# Patient Record
Sex: Female | Born: 1940 | Race: Black or African American | Hispanic: No | Smoking: Never smoker
Health system: Southern US, Community
[De-identification: ages and names within clinical notes are randomized; demographics above are authoritative.]

## PROBLEM LIST (undated history)

## (undated) DIAGNOSIS — C786 Secondary malignant neoplasm of retroperitoneum and peritoneum: Secondary | ICD-10-CM

## (undated) DIAGNOSIS — C801 Malignant (primary) neoplasm, unspecified: Secondary | ICD-10-CM

## (undated) DIAGNOSIS — E7800 Pure hypercholesterolemia, unspecified: Secondary | ICD-10-CM

## (undated) DIAGNOSIS — E119 Type 2 diabetes mellitus without complications: Secondary | ICD-10-CM

## (undated) DIAGNOSIS — I10 Essential (primary) hypertension: Secondary | ICD-10-CM

## (undated) DIAGNOSIS — D509 Iron deficiency anemia, unspecified: Secondary | ICD-10-CM

## (undated) DIAGNOSIS — K219 Gastro-esophageal reflux disease without esophagitis: Secondary | ICD-10-CM

## (undated) DIAGNOSIS — E039 Hypothyroidism, unspecified: Secondary | ICD-10-CM

## (undated) HISTORY — PX: ABDOMINAL HYSTERECTOMY: SHX81

## (undated) HISTORY — DX: Iron deficiency anemia, unspecified: D50.9

## (undated) HISTORY — DX: Malignant (primary) neoplasm, unspecified: C80.1

## (undated) HISTORY — DX: Secondary malignant neoplasm of retroperitoneum and peritoneum: C78.6

---

## 2011-08-17 ENCOUNTER — Encounter (HOSPITAL_COMMUNITY): Payer: Self-pay | Admitting: Pharmacy Technician

## 2011-08-18 ENCOUNTER — Encounter (HOSPITAL_COMMUNITY)
Admission: RE | Admit: 2011-08-18 | Discharge: 2011-08-18 | Disposition: A | Discharge status: Home / Self Care | Payer: Medicare Other | Source: Ambulatory Visit | Attending: Ophthalmology | Admitting: Ophthalmology

## 2011-08-18 ENCOUNTER — Encounter (HOSPITAL_COMMUNITY): Payer: Self-pay

## 2011-08-18 HISTORY — DX: Hypothyroidism, unspecified: E03.9

## 2011-08-18 HISTORY — DX: Type 2 diabetes mellitus without complications: E11.9

## 2011-08-18 HISTORY — DX: Pure hypercholesterolemia, unspecified: E78.00

## 2011-08-18 HISTORY — DX: Essential (primary) hypertension: I10

## 2011-08-18 LAB — BASIC METABOLIC PANEL
CO2: 28 mEq/L (ref 19–32)
Calcium: 9.6 mg/dL (ref 8.4–10.5)
Creatinine, Ser: 0.68 mg/dL (ref 0.50–1.10)
Glucose, Bld: 101 mg/dL — ABNORMAL HIGH (ref 70–99)

## 2011-08-18 LAB — HEMOGLOBIN AND HEMATOCRIT, BLOOD: HCT: 32.5 % — ABNORMAL LOW (ref 36.0–46.0)

## 2011-08-18 NOTE — Patient Instructions (Signed)
Your procedure is scheduled on:  Monday, 08/23/11  Report to [facility] at  0800   AM.  Call this number if you have problems the morning of surgery: [phone]   Remember:   Do not eat or drink   After Midnight.  Take these medicines the morning of surgery with A SIP OF WATER: levothyroxine and enalapril   Do not wear jewelry, make-up or nail polish.  Do not wear lotions, powders, or perfumes. You may wear deodorant.  Do not bring valuables to the hospital.  Contacts, dentures or bridgework may not be worn into surgery.     Patients discharged the day of surgery will not be allowed to drive home.  Special Instructions: Use eye drops as directed.   Please read over the following fact sheets that you were given: Pain Booklet, Anesthesia Post-op Instructions and Care and Recovery After Surgery    Cataract Surgery  A cataract is a clouding of the lens of the eye. When a lens becomes cloudy, vision is reduced based on the degree and nature of the clouding. Surgery may be needed to improve vision. Surgery removes the cloudy lens and usually replaces it with a substitute lens (intraocular lens, IOL). LET YOUR EYE DOCTOR KNOW ABOUT:  Allergies to food or medicine.   Medicines taken including herbs, eyedrops, over-the-counter medicines, and creams.   Use of steroids (by mouth or creams).   Previous problems with anesthetics or numbing medicine.   History of bleeding problems or blood clots.   Previous surgery.   Other health problems, including diabetes and kidney problems.   Possibility of pregnancy, if this applies.  RISKS AND COMPLICATIONS  Infection.   Inflammation of the eyeball (endophthalmitis) that can spread to both eyes (sympathetic ophthalmia).   Poor wound healing.   If an IOL is inserted, it can later fall out of proper position. This is very uncommon.   Clouding of the part of your eye that holds an IOL in place. This is called an "after-cataract." These are  uncommon, but easily treated.  BEFORE THE PROCEDURE  Do not eat or drink anything except small amounts of water for 8 to 12 before your surgery, or as directed by your caregiver.   Unless you are told otherwise, continue any eyedrops you have been prescribed.   Talk to your primary caregiver about all other medicines that you take (both prescription and non-prescription). In some cases, you may need to stop or change medicines near the time of your surgery. This is most important if you are taking blood-thinning medicine.Do not stop medicines unless you are told to do so.   Arrange for someone to drive you to and from the procedure.   Do not put contact lenses in either eye on the day of your surgery.  PROCEDURE There is more than one method for safely removing a cataract. Your doctor can explain the differences and help determine which is best for you. Phacoemulsification surgery is the most common form of cataract surgery.  An injection is given behind the eye or eyedrops are given to make this a painless procedure.   A small cut (incision) is made on the edge of the clear, dome-shaped surface that covers the front of the eye (cornea).   A tiny probe is painlessly inserted into the eye. This device gives off ultrasound waves that soften and break up the cloudy center of the lens. This makes it easier for the cloudy lens to be removed by suction.  An IOL may be implanted.   The normal lens of the eye is covered by a clear capsule. Part of that capsule is intentionally left in the eye to support the IOL.   Your surgeon may or may not use stitches to close the incision.  There are other forms of cataract surgery that require a larger incision and stiches to close the eye. This approach is taken in cases where the doctor feels that the cataract cannot be easily removed using phacoemulsification. AFTER THE PROCEDURE  When an IOL is implanted, it does not need care. It becomes a  permanent part of your eye and cannot be seen or felt.   Your doctor will schedule follow-up exams to check on your progress.   Review your other medicines with your doctor to see which can be resumed after surgery.   Use eyedrops or take medicine as prescribed by your doctor.  Document Released: 02/18/2011 Document Reviewed: 02/15/2011 [facility] Patient Information 2012 [place], [place].  PATIENT INSTRUCTIONS POST-ANESTHESIA  IMMEDIATELY FOLLOWING SURGERY:  Do not drive or operate machinery for the first twenty four hours after surgery.  Do not make any important decisions for twenty four hours after surgery or while taking narcotic pain medications or sedatives.  If you develop intractable nausea and vomiting or a severe headache please notify your doctor immediately.  FOLLOW-UP:  Please make an appointment with your surgeon as instructed. You do not need to follow up with anesthesia unless specifically instructed to do so.  WOUND CARE INSTRUCTIONS (if applicable):  Keep a dry clean dressing on the anesthesia/puncture wound site if there is drainage.  Once the wound has quit draining you may leave it open to air.  Generally you should leave the bandage intact for twenty four hours unless there is drainage.  If the epidural site drains for more than 36-48 hours please call the anesthesia department.  QUESTIONS?:  Please feel free to call your physician or the hospital operator if you have any questions, and they will be happy to assist you.

## 2011-08-20 MED ORDER — CYCLOPENTOLATE-PHENYLEPHRINE 0.2-1 % OP SOLN
OPHTHALMIC | Status: AC
  Filled 2011-08-20: qty 2

## 2011-08-20 MED ORDER — TETRACAINE HCL 0.5 % OP SOLN
OPHTHALMIC | Status: AC
  Filled 2011-08-20: qty 2

## 2011-08-20 MED ORDER — NEOMYCIN-POLYMYXIN-DEXAMETH 3.5-10000-0.1 OP OINT
TOPICAL_OINTMENT | OPHTHALMIC | Status: AC
  Filled 2011-08-20: qty 3.5

## 2011-08-20 MED ORDER — PHENYLEPHRINE HCL 2.5 % OP SOLN
OPHTHALMIC | Status: AC
  Filled 2011-08-20: qty 2

## 2011-08-20 MED ORDER — LIDOCAINE HCL 3.5 % OP GEL
OPHTHALMIC | Status: AC
  Filled 2011-08-20: qty 5

## 2011-08-23 ENCOUNTER — Ambulatory Visit (HOSPITAL_COMMUNITY): Payer: Medicare Other | Admitting: Anesthesiology

## 2011-08-23 ENCOUNTER — Encounter (HOSPITAL_COMMUNITY): Payer: Self-pay | Admitting: Anesthesiology

## 2011-08-23 ENCOUNTER — Encounter (HOSPITAL_COMMUNITY): Payer: Self-pay | Admitting: *Deleted

## 2011-08-23 ENCOUNTER — Encounter (HOSPITAL_COMMUNITY)
Admission: RE | Disposition: A | Discharge status: Home Health | Payer: Self-pay | Source: Ambulatory Visit | Attending: Ophthalmology

## 2011-08-23 ENCOUNTER — Ambulatory Visit (HOSPITAL_COMMUNITY)
Admission: RE | Admit: 2011-08-23 | Discharge: 2011-08-23 | Disposition: A | Discharge status: Home Health | Payer: Medicare Other | Source: Ambulatory Visit | Attending: Ophthalmology | Admitting: Ophthalmology

## 2011-08-23 DIAGNOSIS — Z79899 Other long term (current) drug therapy: Secondary | ICD-10-CM | POA: Insufficient documentation

## 2011-08-23 DIAGNOSIS — Z01810 Encounter for preprocedural cardiovascular examination: Secondary | ICD-10-CM | POA: Insufficient documentation

## 2011-08-23 DIAGNOSIS — Z01812 Encounter for preprocedural laboratory examination: Secondary | ICD-10-CM | POA: Insufficient documentation

## 2011-08-23 DIAGNOSIS — E119 Type 2 diabetes mellitus without complications: Secondary | ICD-10-CM | POA: Insufficient documentation

## 2011-08-23 DIAGNOSIS — I10 Essential (primary) hypertension: Secondary | ICD-10-CM | POA: Insufficient documentation

## 2011-08-23 DIAGNOSIS — H2589 Other age-related cataract: Secondary | ICD-10-CM | POA: Insufficient documentation

## 2011-08-23 HISTORY — PX: CATARACT EXTRACTION W/PHACO: SHX586

## 2011-08-23 SURGERY — PHACOEMULSIFICATION, CATARACT, WITH IOL INSERTION
Anesthesia: Monitor Anesthesia Care | Site: Eye | Laterality: Left | Wound class: Clean

## 2011-08-23 MED ORDER — POVIDONE-IODINE 5 % OP SOLN
OPHTHALMIC | Status: DC | PRN
  Administered 2011-08-23: 1 via OPHTHALMIC

## 2011-08-23 MED ORDER — LACTATED RINGERS IV SOLN
INTRAVENOUS | Status: DC
  Administered 2011-08-23: 09:00:00 via INTRAVENOUS

## 2011-08-23 MED ORDER — MIDAZOLAM HCL 2 MG/2ML IJ SOLN
1.0000 mg | INTRAMUSCULAR | Status: DC | PRN
  Administered 2011-08-23: 2 mg via INTRAVENOUS

## 2011-08-23 MED ORDER — EPINEPHRINE HCL 1 MG/ML IJ SOLN
INTRAMUSCULAR | Status: AC
  Filled 2011-08-23: qty 1

## 2011-08-23 MED ORDER — LIDOCAINE HCL 3.5 % OP GEL: 1.0000 "application " | Freq: Once | OPHTHALMIC | Status: DC

## 2011-08-23 MED ORDER — LIDOCAINE 3.5 % OP GEL OPTIME - NO CHARGE
OPHTHALMIC | Status: DC | PRN
  Administered 2011-08-23: 2 [drp] via OPHTHALMIC

## 2011-08-23 MED ORDER — CYCLOPENTOLATE-PHENYLEPHRINE 0.2-1 % OP SOLN
1.0000 [drp] | OPHTHALMIC | Status: AC
  Administered 2011-08-23 (×3): 1 [drp] via OPHTHALMIC

## 2011-08-23 MED ORDER — TETRACAINE HCL 0.5 % OP SOLN
1.0000 [drp] | OPHTHALMIC | Status: AC
  Administered 2011-08-23 (×3): 1 [drp] via OPHTHALMIC

## 2011-08-23 MED ORDER — PHENYLEPHRINE HCL 2.5 % OP SOLN
1.0000 [drp] | OPHTHALMIC | Status: AC
  Administered 2011-08-23 (×3): 1 [drp] via OPHTHALMIC

## 2011-08-23 MED ORDER — BSS IO SOLN
INTRAOCULAR | Status: DC | PRN
  Administered 2011-08-23: 15 mL via INTRAOCULAR

## 2011-08-23 MED ORDER — MIDAZOLAM HCL 2 MG/2ML IJ SOLN
INTRAMUSCULAR | Status: AC
  Filled 2011-08-23: qty 2

## 2011-08-23 MED ORDER — NEOMYCIN-POLYMYXIN-DEXAMETH 0.1 % OP OINT
TOPICAL_OINTMENT | OPHTHALMIC | Status: DC | PRN
  Administered 2011-08-23: 1 via OPHTHALMIC

## 2011-08-23 MED ORDER — LIDOCAINE HCL (PF) 1 % IJ SOLN
INTRAMUSCULAR | Status: DC | PRN
  Administered 2011-08-23: .3 mL

## 2011-08-23 MED ORDER — PROVISC 10 MG/ML IO SOLN
INTRAOCULAR | Status: DC | PRN
  Administered 2011-08-23: 8.5 mg via INTRAOCULAR

## 2011-08-23 MED ORDER — EPINEPHRINE HCL 1 MG/ML IJ SOLN
INTRAOCULAR | Status: DC | PRN
  Administered 2011-08-23: 10:00:00

## 2011-08-23 SURGICAL SUPPLY — 32 items
CAPSULAR TENSION RING-AMO (OPHTHALMIC RELATED) IMPLANT
CLOTH BEACON ORANGE TIMEOUT ST (SAFETY) ×2 IMPLANT
EYE SHIELD UNIVERSAL CLEAR (GAUZE/BANDAGES/DRESSINGS) ×2 IMPLANT
GLOVE BIO SURGEON STRL SZ 6.5 (GLOVE) IMPLANT
GLOVE BIOGEL PI IND STRL 6.5 (GLOVE) ×1 IMPLANT
GLOVE BIOGEL PI IND STRL 7.0 (GLOVE) ×2 IMPLANT
GLOVE BIOGEL PI IND STRL 7.5 (GLOVE) IMPLANT
GLOVE BIOGEL PI INDICATOR 6.5 (GLOVE) ×1
GLOVE BIOGEL PI INDICATOR 7.0 (GLOVE) ×2
GLOVE BIOGEL PI INDICATOR 7.5 (GLOVE)
GLOVE ECLIPSE 6.5 STRL STRAW (GLOVE) IMPLANT
GLOVE ECLIPSE 7.0 STRL STRAW (GLOVE) IMPLANT
GLOVE ECLIPSE 7.5 STRL STRAW (GLOVE) IMPLANT
GLOVE EXAM NITRILE LRG STRL (GLOVE) IMPLANT
GLOVE EXAM NITRILE MD LF STRL (GLOVE) ×2 IMPLANT
GLOVE SKINSENSE NS SZ6.5 (GLOVE)
GLOVE SKINSENSE NS SZ7.0 (GLOVE)
GLOVE SKINSENSE STRL SZ6.5 (GLOVE) IMPLANT
GLOVE SKINSENSE STRL SZ7.0 (GLOVE) IMPLANT
KIT VITRECTOMY (OPHTHALMIC RELATED) IMPLANT
PAD ARMBOARD 7.5X6 YLW CONV (MISCELLANEOUS) ×2 IMPLANT
PROC W NO LENS (INTRAOCULAR LENS)
PROC W SPEC LENS (INTRAOCULAR LENS)
PROCESS W NO LENS (INTRAOCULAR LENS) IMPLANT
PROCESS W SPEC LENS (INTRAOCULAR LENS) IMPLANT
RING MALYGIN (MISCELLANEOUS) IMPLANT
SIGHTPATH CAT PROC W REG LENS (Ophthalmic Related) ×2 IMPLANT
SYR TB 1ML LL NO SAFETY (SYRINGE) ×2 IMPLANT
TAPE SURG TRANSPORE 1 IN (GAUZE/BANDAGES/DRESSINGS) ×1 IMPLANT
TAPE SURGICAL TRANSPORE 1 IN (GAUZE/BANDAGES/DRESSINGS) ×1
VISCOELASTIC ADDITIONAL (OPHTHALMIC RELATED) IMPLANT
WATER STERILE IRR 250ML POUR (IV SOLUTION) ×2 IMPLANT

## 2011-08-23 NOTE — H&P (Signed)
I have reviewed the H&P, the patient was re-examined, and I have identified no interval changes in medical condition and plan of care since the history and physical of record  

## 2011-08-23 NOTE — Op Note (Signed)
[name], Sherry Hopkins          ACCOUNT NO.:  [id number]  MEDICAL RECORD NO.:  [id number]  LOCATION:  [facility]                          FACILITY:  [facility]  PHYSICIAN:  Susanne Greenhouse, MD       DATE OF BIRTH:  12-Jul-1940  DATE OF PROCEDURE:  08/23/2011 DATE OF DISCHARGE:  08/23/2011                              OPERATIVE REPORT   PREOPERATIVE DIAGNOSIS:  Combined cataract, left eye, diagnosis code 366.19.  POSTOPERATIVE DIAGNOSIS:  Combined cataract, left eye, diagnosis code 366.19.  OPERATION PERFORMED:  Phacoemulsification with posterior chamber intraocular lens implantation, left eye.  SURGEON:  Bonne Dolores. Rashard Ryle, MD  ANESTHESIA:  Topical with IV sedation and monitored anesthesia care.  OPERATIVE SUMMARY:  In the preoperative area, dilating drops were placed into the left eye.  The patient was then brought into the operating room where he was placed under general anesthesia.  The eye was then prepped and draped.  Beginning with a 75 blade, a paracentesis port was made at the surgeon's 2 o'clock position.  The anterior chamber was then filled with a 1% nonpreserved lidocaine solution with epinephrine.  This was followed by Viscoat to deepen the chamber.  A small fornix-based peritomy was performed superiorly.  Next, a single iris hook was placed through the limbus superiorly.  A 2.4-mm keratome blade was then used to make a clear corneal incision over the iris hook.  A bent cystotome needle and Utrata forceps were used to create a continuous tear capsulotomy.  Hydrodissection was performed using balanced salt solution on a fine cannula.  The lens nucleus was then removed using phacoemulsification in a quadrant cracking technique.  The cortical material was then removed with irrigation and aspiration.  The capsular bag and anterior chamber were refilled with Provisc.  The wound was widened to approximately 3 mm and a posterior chamber intraocular lens was placed into the capsular bag  without difficulty using an [employer] lens injecting system.  A single 10-0 nylon suture was then used to close the incision as well as stromal hydration.  The Provisc was removed from the anterior chamber and capsular bag with irrigation and aspiration.  At this point, the wounds were tested for leak, which were negative.  The anterior chamber remained deep and stable.  The patient tolerated the procedure well.  There were no operative complications, and he awoke from general anesthesia without problem.  There were no surgical specimens.  Prosthetic device used is a Lenstec posterior chamber lens, model Softec HD, power of 19.75, serial number is [id number].          ______________________________ Susanne Greenhouse, MD     KEH/MEDQ  D:  08/23/2011  T:  08/23/2011  Job:  [id number]

## 2011-08-23 NOTE — Anesthesia Postprocedure Evaluation (Signed)
  Anesthesia Post-op Note  Patient: Sherry Hopkins  Procedure(s) Performed: Procedure(s) (LRB): CATARACT EXTRACTION PHACO AND INTRAOCULAR LENS PLACEMENT (IOC) (Left)  Patient Location:  Short Stay  Anesthesia Type: MAC  Level of Consciousness: awake  Airway and Oxygen Therapy: Patient Spontanous Breathing  Post-op Pain: none  Post-op Assessment: Post-op Vital signs reviewed, Patient's Cardiovascular Status Stable, Respiratory Function Stable, Patent Airway, No signs of Nausea or vomiting and Pain level controlled  Post-op Vital Signs: Reviewed and stable  Complications: No apparent anesthesia complications

## 2011-08-23 NOTE — Transfer of Care (Signed)
Immediate Anesthesia Transfer of Care Note  Patient: Sherry Hopkins  Procedure(s) Performed: Procedure(s) (LRB): CATARACT EXTRACTION PHACO AND INTRAOCULAR LENS PLACEMENT (IOC) (Left)  Patient Location: Shortstay  Anesthesia Type: MAC  Level of Consciousness: awake  Airway & Oxygen Therapy: Patient Spontanous Breathing   Post-op Assessment: Report given to PACU RN, Post -op Vital signs reviewed and stable and Patient moving all extremities  Post vital signs: Reviewed and stable  Complications: No apparent anesthesia complications

## 2011-08-23 NOTE — Anesthesia Procedure Notes (Signed)
Procedure Name: MAC Date/Time: 08/23/2011 9:35 AM Performed by: Franco Nones Pre-anesthesia Checklist: Patient identified, Emergency Drugs available, Suction available, Timeout performed and Patient being monitored Patient Re-evaluated:Patient Re-evaluated prior to inductionOxygen Delivery Method: Nasal Cannula

## 2011-08-23 NOTE — Anesthesia Preprocedure Evaluation (Signed)
Anesthesia Evaluation    Airway Mallampati: II      Dental  (+) Partial Upper   Pulmonary neg pulmonary ROS,  breath sounds clear to auscultation        Cardiovascular hypertension, Pt. on medications Rhythm:Regular Rate:Normal     Neuro/Psych    GI/Hepatic   Endo/Other  Diabetes mellitus-, Well Controlled, Type 2Hypothyroidism   Renal/GU      Musculoskeletal   Abdominal   Peds  Hematology   Anesthesia Other Findings   Reproductive/Obstetrics                           Anesthesia Physical Anesthesia Plan  ASA: II  Anesthesia Plan: MAC   Post-op Pain Management:    Induction: Intravenous  Airway Management Planned: Nasal Cannula  Additional Equipment:   Intra-op Plan:   Post-operative Plan:   Informed Consent: I have reviewed the patients History and Physical, chart, labs and discussed the procedure including the risks, benefits and alternatives for the proposed anesthesia with the patient or authorized representative who has indicated his/her understanding and acceptance.     Plan Discussed with:   Anesthesia Plan Comments:         Anesthesia Quick Evaluation

## 2011-08-23 NOTE — Brief Op Note (Signed)
Pre-Op Dx: Cataract OS Post-Op Dx: Cataract OS Surgeon: Xaine Sansom Anesthesia: Topical with MAC Surgery: Cataract Extraction with Intraocular lens Implant OS Implant: Lenstec, Model Softec HD Specimen: None Complications: None 

## 2011-08-23 NOTE — Discharge Instructions (Signed)
Sherry Hopkins  08/23/2011     Instructions  1. Use medications as Instructed.  Shake well before use. Wait 5 minutes between drops.  {2. Do not rub the operative eye. Do not swim underwater for 2 weeks.  3. You may remove the clear shield and resume your normal activities the day after  Surgery. Your eyes may feel more comfortable if you wear dark glasses outside.  4. Call our office at [phone] if you have sudden change in vision, extreme redness or pain. Some fluctuation in vision is normal after surgery. If you have an emergency after hours, call Dr. Alto Denver at [phone].  5. It is important that you attend all of your follow-up appointments.        Follow-up:{follow up:32580} with Gemma Payor, MD.   Dr. Lahoma Crocker: [phone]  Dr. Lita Mains: [phone]  Dr. Alto Denver: [phone]   If you find that you cannot contact your physician, but feel that your signs and   Symptoms warrant a physician's attention, call the Emergency Room at   [phone].   Other{NA AND VFIEPPIR:51884}

## 2011-08-24 ENCOUNTER — Encounter (HOSPITAL_COMMUNITY): Payer: Self-pay | Admitting: Ophthalmology

## 2013-08-09 ENCOUNTER — Encounter (HOSPITAL_COMMUNITY): Payer: Self-pay | Admitting: Pharmacy Technician

## 2013-08-10 NOTE — Patient Instructions (Signed)
Kaydance Bowie  08/10/2013   Your procedure is scheduled on:  08/16/13  Report to [facility] at 9:30 AM.  Call this number if you have problems the morning of surgery: [phone]   Remember:   Do not eat food or drink liquids after midnight.   Take these medicines the morning of surgery with A SIP OF WATER: Vasotec and Synthroid   Do not wear jewelry, make-up or nail polish.  Do not wear lotions, powders, or perfumes. You may wear deodorant.  Do not shave 48 hours prior to surgery. Men may shave face and neck.  Do not bring valuables to the hospital.  [facility] is not responsible for any belongings or valuables.               Contacts, dentures or bridgework may not be worn into surgery.  Leave suitcase in the car. After surgery it may be brought to your room.  For patients admitted to the hospital, discharge time is determined by your treatment team.               Patients discharged the day of surgery will not be allowed to drive  home.  Name and phone number of your driver:   Special Instructions: N/A   Please read over the following fact sheets that you were given: Anesthesia Post-op Instructions   PATIENT INSTRUCTIONS POST-ANESTHESIA  IMMEDIATELY FOLLOWING SURGERY:  Do not drive or operate machinery for the first twenty four hours after surgery.  Do not make any important decisions for twenty four hours after surgery or while taking narcotic pain medications or sedatives.  If you develop intractable nausea and vomiting or a severe headache please notify your doctor immediately.  FOLLOW-UP:  Please make an appointment with your surgeon as instructed. You do not need to follow up with anesthesia unless specifically instructed to do so.  WOUND CARE INSTRUCTIONS (if applicable):  Keep a dry clean dressing on the anesthesia/puncture wound site if there is drainage.  Once the wound has quit draining you may leave it open to air.  Generally you should leave the bandage intact  for twenty four hours unless there is drainage.  If the epidural site drains for more than 36-48 hours please call the anesthesia department.  QUESTIONS?:  Please feel free to call your physician or the hospital operator if you have any questions, and they will be happy to assist you.      Cataract Surgery  A cataract is a clouding of the lens of the eye. When a lens becomes cloudy, vision is reduced based on the degree and nature of the clouding. Surgery may be needed to improve vision. Surgery removes the cloudy lens and usually replaces it with a substitute lens (intraocular lens, IOL). LET YOUR EYE DOCTOR KNOW ABOUT:  Allergies to food or medicine.  Medicines taken including herbs, eyedrops, over-the-counter medicines, and creams.  Use of steroids (by mouth or creams).  Previous problems with anesthetics or numbing medicine.  History of bleeding problems or blood clots.  Previous surgery.  Other health problems, including diabetes and kidney problems.  Possibility of pregnancy, if this applies. RISKS AND COMPLICATIONS  Infection.  Inflammation of the eyeball (endophthalmitis) that can spread to both eyes (sympathetic ophthalmia).  Poor wound healing.  If an IOL is inserted, it can later fall out of proper position. This is very uncommon.  Clouding of the part of your eye that holds an IOL in place. This is called an "after-cataract."  These are uncommon, but easily treated. BEFORE THE PROCEDURE  Do not eat or drink anything except small amounts of water for 8 to 12 before your surgery, or as directed by your caregiver.  Unless you are told otherwise, continue any eyedrops you have been prescribed.  Talk to your primary caregiver about all other medicines that you take (both prescription and non-prescription). In some cases, you may need to stop or change medicines near the time of your surgery. This is most important if you are taking blood-thinning medicine.Do not stop  medicines unless you are told to do so.  Arrange for someone to drive you to and from the procedure.  Do not put contact lenses in either eye on the day of your surgery. PROCEDURE There is more than one method for safely removing a cataract. Your doctor can explain the differences and help determine which is best for you. Phacoemulsification surgery is the most common form of cataract surgery.  An injection is given behind the eye or eyedrops are given to make this a painless procedure.  A small cut (incision) is made on the edge of the clear, dome-shaped surface that covers the front of the eye (cornea).  A tiny probe is painlessly inserted into the eye. This device gives off ultrasound waves that soften and break up the cloudy center of the lens. This makes it easier for the cloudy lens to be removed by suction.  An IOL may be implanted.  The normal lens of the eye is covered by a clear capsule. Part of that capsule is intentionally left in the eye to support the IOL.  Your surgeon may or may not use stitches to close the incision. There are other forms of cataract surgery that require a larger incision and stiches to close the eye. This approach is taken in cases where the doctor feels that the cataract cannot be easily removed using phacoemulsification. AFTER THE PROCEDURE  When an IOL is implanted, it does not need care. It becomes a permanent part of your eye and cannot be seen or felt.  Your doctor will schedule follow-up exams to check on your progress.  Review your other medicines with your doctor to see which can be resumed after surgery.  Use eyedrops or take medicine as prescribed by your doctor. Document Released: 02/18/2011 Document Revised: 05/24/2011 Document Reviewed: 02/18/2011 [facility] Patient Information 2014 [place], [place].

## 2013-08-13 ENCOUNTER — Encounter (HOSPITAL_COMMUNITY): Payer: Self-pay

## 2013-08-13 ENCOUNTER — Other Ambulatory Visit: Payer: Self-pay

## 2013-08-13 ENCOUNTER — Encounter (HOSPITAL_COMMUNITY)
Admission: RE | Admit: 2013-08-13 | Discharge: 2013-08-13 | Disposition: A | Discharge status: Home / Self Care | Payer: Medicare FFS | Source: Ambulatory Visit | Attending: Ophthalmology | Admitting: Ophthalmology

## 2013-08-13 DIAGNOSIS — H2589 Other age-related cataract: Secondary | ICD-10-CM | POA: Diagnosis not present

## 2013-08-13 DIAGNOSIS — I10 Essential (primary) hypertension: Secondary | ICD-10-CM | POA: Diagnosis not present

## 2013-08-13 DIAGNOSIS — E119 Type 2 diabetes mellitus without complications: Secondary | ICD-10-CM | POA: Diagnosis not present

## 2013-08-13 LAB — BASIC METABOLIC PANEL
BUN: 12 mg/dL (ref 6–23)
CALCIUM: 9.2 mg/dL (ref 8.4–10.5)
CO2: 27 meq/L (ref 19–32)
CREATININE: 0.66 mg/dL (ref 0.50–1.10)
Chloride: 103 mEq/L (ref 96–112)
GFR, EST NON AFRICAN AMERICAN: 87 mL/min — AB (ref >90)
Glucose, Bld: 108 mg/dL — ABNORMAL HIGH (ref 70–99)
Potassium: 4 mEq/L (ref 3.7–5.3)
SODIUM: 143 meq/L (ref 137–147)

## 2013-08-13 LAB — HEMOGLOBIN AND HEMATOCRIT, BLOOD
HEMATOCRIT: 34.2 % — AB (ref 36.0–46.0)
HEMOGLOBIN: 11 g/dL — AB (ref 12.0–15.0)

## 2013-08-13 NOTE — Pre-Procedure Instructions (Signed)
Pateitn given information to sign up for my chart at home.

## 2013-08-15 MED ORDER — TETRACAINE HCL 0.5 % OP SOLN
OPHTHALMIC | Status: AC
  Filled 2013-08-15: qty 2

## 2013-08-15 MED ORDER — CYCLOPENTOLATE-PHENYLEPHRINE OP SOLN OPTIME - NO CHARGE
OPHTHALMIC | Status: AC
  Filled 2013-08-15: qty 2

## 2013-08-15 MED ORDER — PHENYLEPHRINE HCL 2.5 % OP SOLN
OPHTHALMIC | Status: AC
  Filled 2013-08-15: qty 15

## 2013-08-15 MED ORDER — NEOMYCIN-POLYMYXIN-DEXAMETH 3.5-10000-0.1 OP SUSP
OPHTHALMIC | Status: AC
  Filled 2013-08-15: qty 5

## 2013-08-16 ENCOUNTER — Encounter (HOSPITAL_COMMUNITY)
Admission: RE | Disposition: A | Discharge status: Home / Self Care | Payer: Self-pay | Source: Ambulatory Visit | Attending: Ophthalmology

## 2013-08-16 ENCOUNTER — Ambulatory Visit (HOSPITAL_COMMUNITY)
Admission: RE | Admit: 2013-08-16 | Discharge: 2013-08-16 | Disposition: A | Discharge status: Home / Self Care | Payer: Medicare FFS | Source: Ambulatory Visit | Attending: Ophthalmology | Admitting: Ophthalmology

## 2013-08-16 ENCOUNTER — Encounter (HOSPITAL_COMMUNITY): Payer: Self-pay | Admitting: *Deleted

## 2013-08-16 ENCOUNTER — Encounter (HOSPITAL_COMMUNITY): Payer: Medicare FFS | Admitting: Anesthesiology

## 2013-08-16 ENCOUNTER — Ambulatory Visit (HOSPITAL_COMMUNITY): Payer: Medicare FFS | Admitting: Anesthesiology

## 2013-08-16 DIAGNOSIS — E119 Type 2 diabetes mellitus without complications: Secondary | ICD-10-CM | POA: Insufficient documentation

## 2013-08-16 DIAGNOSIS — I10 Essential (primary) hypertension: Secondary | ICD-10-CM | POA: Insufficient documentation

## 2013-08-16 DIAGNOSIS — H2589 Other age-related cataract: Secondary | ICD-10-CM | POA: Insufficient documentation

## 2013-08-16 HISTORY — PX: CATARACT EXTRACTION W/PHACO: SHX586

## 2013-08-16 LAB — GLUCOSE, CAPILLARY: GLUCOSE-CAPILLARY: 82 mg/dL (ref 70–99)

## 2013-08-16 SURGERY — PHACOEMULSIFICATION, CATARACT, WITH IOL INSERTION
Anesthesia: Monitor Anesthesia Care | Site: Eye | Laterality: Right

## 2013-08-16 MED ORDER — TETRACAINE HCL 0.5 % OP SOLN
1.0000 [drp] | OPHTHALMIC | Status: AC | PRN
  Administered 2013-08-16 (×3): 1 [drp] via OPHTHALMIC

## 2013-08-16 MED ORDER — PHENYLEPHRINE HCL 2.5 % OP SOLN
1.0000 [drp] | OPHTHALMIC | Status: AC | PRN
  Administered 2013-08-16 (×3): 1 [drp] via OPHTHALMIC

## 2013-08-16 MED ORDER — FENTANYL CITRATE 0.05 MG/ML IJ SOLN
25.0000 ug | INTRAMUSCULAR | Status: AC
  Administered 2013-08-16 (×2): 25 ug via INTRAVENOUS

## 2013-08-16 MED ORDER — NEOMYCIN-POLYMYXIN-DEXAMETH 3.5-10000-0.1 OP SUSP
OPHTHALMIC | Status: DC | PRN
  Administered 2013-08-16: 2 [drp] via OPHTHALMIC

## 2013-08-16 MED ORDER — MIDAZOLAM HCL 2 MG/2ML IJ SOLN
INTRAMUSCULAR | Status: AC
  Filled 2013-08-16: qty 2

## 2013-08-16 MED ORDER — EPINEPHRINE HCL 1 MG/ML IJ SOLN
INTRAMUSCULAR | Status: AC
  Filled 2013-08-16: qty 1

## 2013-08-16 MED ORDER — BSS IO SOLN
INTRAOCULAR | Status: DC | PRN
  Administered 2013-08-16: 15 mL

## 2013-08-16 MED ORDER — MIDAZOLAM HCL 2 MG/2ML IJ SOLN
1.0000 mg | INTRAMUSCULAR | Status: DC | PRN
  Administered 2013-08-16: 2 mg via INTRAVENOUS

## 2013-08-16 MED ORDER — PROVISC 10 MG/ML IO SOLN
INTRAOCULAR | Status: DC | PRN
  Administered 2013-08-16: 0.85 mL via INTRAOCULAR

## 2013-08-16 MED ORDER — CYCLOPENTOLATE-PHENYLEPHRINE 0.2-1 % OP SOLN
1.0000 [drp] | OPHTHALMIC | Status: AC | PRN
  Administered 2013-08-16 (×3): 1 [drp] via OPHTHALMIC

## 2013-08-16 MED ORDER — LACTATED RINGERS IV SOLN
INTRAVENOUS | Status: DC
  Administered 2013-08-16: 10:00:00 via INTRAVENOUS

## 2013-08-16 MED ORDER — LIDOCAINE HCL 3.5 % OP GEL
1.0000 "application " | Freq: Once | OPHTHALMIC | Status: AC
  Administered 2013-08-16: 1 via OPHTHALMIC

## 2013-08-16 MED ORDER — FENTANYL CITRATE 0.05 MG/ML IJ SOLN
INTRAMUSCULAR | Status: AC
  Filled 2013-08-16: qty 2

## 2013-08-16 MED ORDER — EPINEPHRINE HCL 1 MG/ML IJ SOLN
INTRAOCULAR | Status: DC | PRN
  Administered 2013-08-16: 11:00:00

## 2013-08-16 MED ORDER — LIDOCAINE HCL (PF) 1 % IJ SOLN
INTRAMUSCULAR | Status: DC | PRN
  Administered 2013-08-16: .5 mL

## 2013-08-16 MED ORDER — LIDOCAINE 3.5 % OP GEL OPTIME - NO CHARGE
OPHTHALMIC | Status: DC | PRN
  Administered 2013-08-16: 2 [drp] via OPHTHALMIC

## 2013-08-16 MED ORDER — POVIDONE-IODINE 5 % OP SOLN
OPHTHALMIC | Status: DC | PRN
  Administered 2013-08-16: 1 via OPHTHALMIC

## 2013-08-16 SURGICAL SUPPLY — 37 items
CAPSULAR TENSION RING-AMO (OPHTHALMIC RELATED) IMPLANT
CLOTH BEACON ORANGE TIMEOUT ST (SAFETY) ×3 IMPLANT
EYE SHIELD UNIVERSAL CLEAR (GAUZE/BANDAGES/DRESSINGS) ×3 IMPLANT
GLOVE BIO SURGEON STRL SZ 6.5 (GLOVE) IMPLANT
GLOVE BIO SURGEONS STRL SZ 6.5 (GLOVE)
GLOVE BIOGEL PI IND STRL 6.5 (GLOVE) ×1 IMPLANT
GLOVE BIOGEL PI IND STRL 7.0 (GLOVE) ×1 IMPLANT
GLOVE BIOGEL PI IND STRL 7.5 (GLOVE) IMPLANT
GLOVE BIOGEL PI IND STRL 8.5 (GLOVE) ×1 IMPLANT
GLOVE BIOGEL PI INDICATOR 6.5 (GLOVE) ×2
GLOVE BIOGEL PI INDICATOR 7.0 (GLOVE) ×2
GLOVE BIOGEL PI INDICATOR 7.5 (GLOVE)
GLOVE BIOGEL PI INDICATOR 8.5 (GLOVE) ×2
GLOVE ECLIPSE 6.5 STRL STRAW (GLOVE) IMPLANT
GLOVE ECLIPSE 7.0 STRL STRAW (GLOVE) IMPLANT
GLOVE ECLIPSE 7.5 STRL STRAW (GLOVE) IMPLANT
GLOVE EXAM NITRILE LRG STRL (GLOVE) IMPLANT
GLOVE EXAM NITRILE MD LF STRL (GLOVE) IMPLANT
GLOVE SKINSENSE NS SZ6.5 (GLOVE)
GLOVE SKINSENSE NS SZ7.0 (GLOVE)
GLOVE SKINSENSE STRL SZ6.5 (GLOVE) IMPLANT
GLOVE SKINSENSE STRL SZ7.0 (GLOVE) IMPLANT
GOWN STRL REUS W/ TWL XL LVL3 (GOWN DISPOSABLE) ×1 IMPLANT
GOWN STRL REUS W/TWL XL LVL3 (GOWN DISPOSABLE) ×2
KIT VITRECTOMY (OPHTHALMIC RELATED) IMPLANT
PAD ARMBOARD 7.5X6 YLW CONV (MISCELLANEOUS) ×3 IMPLANT
PROC W NO LENS (INTRAOCULAR LENS)
PROC W SPEC LENS (INTRAOCULAR LENS)
PROCESS W NO LENS (INTRAOCULAR LENS) IMPLANT
PROCESS W SPEC LENS (INTRAOCULAR LENS) IMPLANT
RING MALYGIN (MISCELLANEOUS) IMPLANT
SIGHTPATH CAT PROC W REG LENS (Ophthalmic Related) ×3 IMPLANT
SYR TB 1ML LL NO SAFETY (SYRINGE) ×3 IMPLANT
TAPE SURG TRANSPORE 1 IN (GAUZE/BANDAGES/DRESSINGS) ×1 IMPLANT
TAPE SURGICAL TRANSPORE 1 IN (GAUZE/BANDAGES/DRESSINGS) ×2
VISCOELASTIC ADDITIONAL (OPHTHALMIC RELATED) IMPLANT
WATER STERILE IRR 250ML POUR (IV SOLUTION) ×3 IMPLANT

## 2013-08-16 NOTE — H&P (Signed)
I have reviewed the H&P, the patient was re-examined, and I have identified no interval changes in medical condition and plan of care since the history and physical of record  

## 2013-08-16 NOTE — Transfer of Care (Signed)
Immediate Anesthesia Transfer of Care Note  Patient: Sherry Hopkins  Procedure(s) Performed: Procedure(s) with comments: CATARACT EXTRACTION PHACO AND INTRAOCULAR LENS PLACEMENT (IOC) (Right) - CDE:8.18  Patient Location: Short Stay  Anesthesia Type:MAC  Level of Consciousness: awake, alert , oriented and patient cooperative  Airway & Oxygen Therapy: Patient Spontanous Breathing  Post-op Assessment: Report given to PACU RN, Post -op Vital signs reviewed and stable and Patient moving all extremities  Post vital signs: Reviewed and stable  Complications: No apparent anesthesia complications

## 2013-08-16 NOTE — Anesthesia Postprocedure Evaluation (Signed)
  Anesthesia Post-op Note  Patient: Sherry Hopkins  Procedure(s) Performed: Procedure(s) with comments: CATARACT EXTRACTION PHACO AND INTRAOCULAR LENS PLACEMENT (IOC) (Right) - CDE:8.18  Patient Location: Short Stay  Anesthesia Type:MAC  Level of Consciousness: awake, alert , oriented and patient cooperative  Airway and Oxygen Therapy: Patient Spontanous Breathing  Post-op Pain: none  Post-op Assessment: Post-op Vital signs reviewed, Patient's Cardiovascular Status Stable, Respiratory Function Stable and Pain level controlled  Post-op Vital Signs: Reviewed and stable  Last Vitals:  Filed Vitals:   08/16/13 1040  BP: 130/71  Temp:   Resp: 24    Complications: No apparent anesthesia complications

## 2013-08-16 NOTE — Anesthesia Preprocedure Evaluation (Signed)
Anesthesia Evaluation    Airway Mallampati: II      Dental  (+) Partial Upper   Pulmonary neg pulmonary ROS,  breath sounds clear to auscultation        Cardiovascular hypertension, Pt. on medications Rhythm:Regular Rate:Normal     Neuro/Psych    GI/Hepatic   Endo/Other  diabetes, Well Controlled, Type 2Hypothyroidism   Renal/GU      Musculoskeletal   Abdominal   Peds  Hematology   Anesthesia Other Findings   Reproductive/Obstetrics                           Anesthesia Physical Anesthesia Plan  ASA: II  Anesthesia Plan: MAC   Post-op Pain Management:    Induction: Intravenous  Airway Management Planned: Nasal Cannula  Additional Equipment:   Intra-op Plan:   Post-operative Plan:   Informed Consent: I have reviewed the patients History and Physical, chart, labs and discussed the procedure including the risks, benefits and alternatives for the proposed anesthesia with the patient or authorized representative who has indicated his/her understanding and acceptance.     Plan Discussed with:   Anesthesia Plan Comments:         Anesthesia Quick Evaluation

## 2013-08-16 NOTE — Op Note (Signed)
Date of Admission: 08/16/2013  Date of Surgery: 08/16/2013   Pre-Op Dx: Cataract Right Eye  Post-Op Dx: Cataract Right  Eye,  Dx Code 366.19  Surgeon: Tonny Branch, M.D.  Assistants: None  Anesthesia: Topical with MAC  Indications: Painless, progressive loss of vision with compromise of daily activities.  Surgery: Cataract Extraction with Intraocular lens Implant Right Eye  Discription: The patient had dilating drops and viscous lidocaine placed into the Right eye in the pre-op holding area. After transfer to the operating room, a time out was performed. The patient was then prepped and draped. Beginning with a 18 degree blade a paracentesis port was made at the surgeon's 2 o'clock position. The anterior chamber was then filled with 1% non-preserved lidocaine. This was followed by filling the anterior chamber with Provisc.  A 2.67mm keratome blade was used to make a clear corneal incision at the temporal limbus.  A bent cystatome needle was used to create a continuous tear capsulotomy. Hydrodissection was performed with balanced salt solution on a Fine canula. The lens nucleus was then removed using the phacoemulsification handpiece. Residual cortex was removed with the I&A handpiece. The anterior chamber and capsular bag were refilled with Provisc. A posterior chamber intraocular lens was placed into the capsular bag with it's injector. The implant was positioned with the Kuglan hook. The Provisc was then removed from the anterior chamber and capsular bag with the I&A handpiece. Stromal hydration of the main incision and paracentesis port was performed with BSS on a Fine canula. The wounds were tested for leak which was negative. The patient tolerated the procedure well. There were no operative complications. The patient was then transferred to the recovery room in stable condition.  Complications: None  Specimen: None  EBL: None  Prosthetic device: Hoya iSert 250, power 20.0 D, SN [id number].

## 2013-08-17 ENCOUNTER — Encounter (HOSPITAL_COMMUNITY): Payer: Self-pay | Admitting: Ophthalmology

## 2016-10-07 ENCOUNTER — Emergency Department (HOSPITAL_COMMUNITY)
Admission: EM | Admit: 2016-10-07 | Discharge: 2016-10-07 | Disposition: A | Discharge status: Home / Self Care | Payer: Medicare Other | Attending: Emergency Medicine | Admitting: Emergency Medicine

## 2016-10-07 ENCOUNTER — Emergency Department (HOSPITAL_COMMUNITY): Payer: Medicare Other

## 2016-10-07 ENCOUNTER — Encounter (HOSPITAL_COMMUNITY): Payer: Self-pay | Admitting: Emergency Medicine

## 2016-10-07 DIAGNOSIS — R1110 Vomiting, unspecified: Secondary | ICD-10-CM | POA: Insufficient documentation

## 2016-10-07 DIAGNOSIS — Z7902 Long term (current) use of antithrombotics/antiplatelets: Secondary | ICD-10-CM | POA: Insufficient documentation

## 2016-10-07 DIAGNOSIS — Z7984 Long term (current) use of oral hypoglycemic drugs: Secondary | ICD-10-CM | POA: Diagnosis not present

## 2016-10-07 DIAGNOSIS — E119 Type 2 diabetes mellitus without complications: Secondary | ICD-10-CM | POA: Insufficient documentation

## 2016-10-07 DIAGNOSIS — E039 Hypothyroidism, unspecified: Secondary | ICD-10-CM | POA: Insufficient documentation

## 2016-10-07 DIAGNOSIS — Z79899 Other long term (current) drug therapy: Secondary | ICD-10-CM | POA: Insufficient documentation

## 2016-10-07 DIAGNOSIS — I10 Essential (primary) hypertension: Secondary | ICD-10-CM | POA: Diagnosis not present

## 2016-10-07 LAB — COMPREHENSIVE METABOLIC PANEL
ALT: 11 U/L — ABNORMAL LOW (ref 14–54)
ANION GAP: 13 (ref 5–15)
AST: 17 U/L (ref 15–41)
Albumin: 3.4 g/dL — ABNORMAL LOW (ref 3.5–5.0)
Alkaline Phosphatase: 58 U/L (ref 38–126)
BILIRUBIN TOTAL: 0.7 mg/dL (ref 0.3–1.2)
BUN: 22 mg/dL — AB (ref 6–20)
CO2: 29 mmol/L (ref 22–32)
Calcium: 8.7 mg/dL — ABNORMAL LOW (ref 8.9–10.3)
Chloride: 97 mmol/L — ABNORMAL LOW (ref 101–111)
Creatinine, Ser: 0.79 mg/dL (ref 0.44–1.00)
GFR calc Af Amer: >60 mL/min (ref >60)
GFR calc non Af Amer: >60 mL/min (ref >60)
Glucose, Bld: 94 mg/dL (ref 65–99)
POTASSIUM: 3.3 mmol/L — AB (ref 3.5–5.1)
Sodium: 139 mmol/L (ref 135–145)
TOTAL PROTEIN: 7.1 g/dL (ref 6.5–8.1)

## 2016-10-07 LAB — URINALYSIS, ROUTINE W REFLEX MICROSCOPIC
Glucose, UA: NEGATIVE mg/dL
Ketones, ur: 40 mg/dL — AB
Nitrite: NEGATIVE
PH: 6 (ref 5.0–8.0)
Protein, ur: 100 mg/dL — AB
Specific Gravity, Urine: 1.025 (ref 1.005–1.030)

## 2016-10-07 LAB — CBC
HEMATOCRIT: 32.2 % — AB (ref 36.0–46.0)
HEMOGLOBIN: 10 g/dL — AB (ref 12.0–15.0)
MCH: 26.2 pg (ref 26.0–34.0)
MCHC: 31.1 g/dL (ref 30.0–36.0)
MCV: 84.3 fL (ref 78.0–100.0)
Platelets: 333 10*3/uL (ref 150–400)
RBC: 3.82 MIL/uL — ABNORMAL LOW (ref 3.87–5.11)
RDW: 15 % (ref 11.5–15.5)
WBC: 11.6 10*3/uL — AB (ref 4.0–10.5)

## 2016-10-07 LAB — URINALYSIS, MICROSCOPIC (REFLEX)

## 2016-10-07 LAB — LIPASE, BLOOD: LIPASE: 19 U/L (ref 11–51)

## 2016-10-07 MED ORDER — OMEPRAZOLE 20 MG PO CPDR: 20.0000 mg | DELAYED_RELEASE_CAPSULE | Freq: Every day | ORAL | 0 refills | Status: DC

## 2016-10-07 MED ORDER — PANTOPRAZOLE SODIUM 40 MG IV SOLR
40.0000 mg | Freq: Once | INTRAVENOUS | Status: AC
  Administered 2016-10-07: 40 mg via INTRAVENOUS
  Filled 2016-10-07: qty 40

## 2016-10-07 MED ORDER — IOPAMIDOL (ISOVUE-300) INJECTION 61%
100.0000 mL | Freq: Once | INTRAVENOUS | Status: AC | PRN
  Administered 2016-10-07: 100 mL via INTRAVENOUS

## 2016-10-07 MED ORDER — SODIUM CHLORIDE 0.9 % IV BOLUS (SEPSIS)
1000.0000 mL | Freq: Once | INTRAVENOUS | Status: AC
  Administered 2016-10-07: 1000 mL via INTRAVENOUS

## 2016-10-07 MED ORDER — ONDANSETRON 4 MG PO TBDP: ORAL_TABLET | ORAL | 0 refills | Status: DC

## 2016-10-07 NOTE — Discharge Instructions (Signed)
Call dr. Laverle Patter office tomorrow to make an appointment for next week.   [phone]

## 2016-10-07 NOTE — ED Triage Notes (Signed)
Pt c/o abd pain with n/vc x 3 weeks. States she was seen at urgent care x 3 weeks ago and given medication that helped temporarily. Pt reports increased n/v x 1 week. Pt sent by pcp for possible dehydration.

## 2016-10-07 NOTE — ED Provider Notes (Signed)
[facility] DEPT Provider Note   CSN: [id number] Arrival date & time: 10/07/16  1124     History   Chief Complaint Chief Complaint  Patient presents with  . Abdominal Pain    HPI Sherry Hopkins is a 76 y.o. female.  Pt complains of vomiting for weeks   The history is provided by the patient. No language interpreter was used.  Emesis   This is a new problem. The current episode started more than 1 week ago. The problem occurs 5 to 10 times per day. The problem has not changed since onset.The emesis has an appearance of stomach contents. There has been no fever. Pertinent negatives include no abdominal pain, no chills, no cough, no diarrhea and no headaches. Risk factors include suspect food intake.    Past Medical History:  Diagnosis Date  . Diabetes mellitus type 2, diet-controlled (HCC)    diet controlled  . Hypercholesteremia   . Hypertension   . Hypothyroidism     There are no active problems to display for this patient.   Past Surgical History:  Procedure Laterality Date  . ABDOMINAL HYSTERECTOMY    . CATARACT EXTRACTION W/PHACO  08/23/2011   Procedure: CATARACT EXTRACTION PHACO AND INTRAOCULAR LENS PLACEMENT (IOC);  Surgeon: Tonny Branch, MD;  Location: [facility];  Service: Ophthalmology;  Laterality: Left;  CDE:15.60  . CATARACT EXTRACTION W/PHACO Right 08/16/2013   Procedure: CATARACT EXTRACTION PHACO AND INTRAOCULAR LENS PLACEMENT (IOC);  Surgeon: Tonny Branch, MD;  Location: [facility];  Service: Ophthalmology;  Laterality: Right;  CDE:8.18    OB History    No data available       Home Medications    Prior to Admission medications   Medication Sig Start Date End Date Taking? Authorizing Provider  aspirin EC 81 MG tablet Take 81 mg by mouth every morning.   Yes [provider]  Calcium Carbonate-Vitamin D (CALTRATE 600+D) 600-400 MG-UNIT per tablet Take 1 tablet by mouth every evening.   Yes [provider]  enalapril (VASOTEC) 5 MG tablet  Take 5 mg by mouth every morning.   Yes [provider]  levothyroxine (SYNTHROID, LEVOTHROID) 25 MCG tablet Take 25 mcg by mouth daily before breakfast.   Yes [provider]  metFORMIN (GLUCOPHAGE-XR) 500 MG 24 hr tablet Take 500 mg by mouth daily.   Yes [provider]  naproxen sodium (ANAPROX) 220 MG tablet Take 220 mg by mouth daily as needed.   Yes [provider]  pravastatin (PRAVACHOL) 80 MG tablet Take 80 mg by mouth every evening.   Yes [provider]  valsartan (DIOVAN) 80 MG tablet Take 80 mg by mouth daily.   Yes [provider]  omeprazole (PRILOSEC) 20 MG capsule Take 1 capsule (20 mg total) by mouth daily. 10/07/16   Milton Ferguson, MD  ondansetron (ZOFRAN ODT) 4 MG disintegrating tablet 4mg  ODT q4 hours prn nausea/vomit 10/07/16   Milton Ferguson, MD    Family History No family history on file.  Social History Social History  Substance Use Topics  . Smoking status: Never Smoker  . Smokeless tobacco: Not on file  . Alcohol use No     Allergies   Patient has no known allergies.   Review of Systems Review of Systems  Constitutional: Negative for appetite change, chills and fatigue.  HENT: Negative for congestion, ear discharge and sinus pressure.   Eyes: Negative for discharge.  Respiratory: Negative for cough.   Cardiovascular: Negative for chest pain.  Gastrointestinal:  Positive for vomiting. Negative for abdominal pain and diarrhea.  Genitourinary: Negative for frequency and hematuria.  Musculoskeletal: Negative for back pain.  Skin: Negative for rash.  Neurological: Negative for seizures and headaches.  Psychiatric/Behavioral: Negative for hallucinations.     Physical Exam Updated Vital Signs BP 120/73   Pulse 70   Temp (!) 97.5 F (36.4 C) (Oral)   Resp 18   Ht 5\' 3"  (1.6 m)   Wt 63.5 kg (140 lb)   SpO2 98%   BMI 24.80 kg/m   Physical Exam  Constitutional: She is oriented to person, place,  and time.  cachetic  HENT:  Head: Normocephalic.  Eyes: Conjunctivae and EOM are normal. No scleral icterus.  Neck: Neck supple. No thyromegaly present.  Cardiovascular: Normal rate and regular rhythm.  Exam reveals no gallop and no friction rub.   No murmur heard. Pulmonary/Chest: No stridor. She has no wheezes. She has no rales. She exhibits no tenderness.  Abdominal: She exhibits no distension. There is no tenderness. There is no rebound.  Musculoskeletal: Normal range of motion. She exhibits no edema.  Lymphadenopathy:    She has no cervical adenopathy.  Neurological: She is oriented to person, place, and time. She exhibits normal muscle tone. Coordination normal.  Skin: No rash noted. No erythema.  Psychiatric: She has a normal mood and affect. Her behavior is normal.     ED Treatments / Results  Labs (all labs ordered are listed, but only abnormal results are displayed) Labs Reviewed  COMPREHENSIVE METABOLIC PANEL - Abnormal; Notable for the following:       Result Value   Potassium 3.3 (*)    Chloride 97 (*)    BUN 22 (*)    Calcium 8.7 (*)    Albumin 3.4 (*)    ALT 11 (*)    All other components within normal limits  CBC - Abnormal; Notable for the following:    WBC 11.6 (*)    RBC 3.82 (*)    Hemoglobin 10.0 (*)    HCT 32.2 (*)    All other components within normal limits  URINALYSIS, ROUTINE W REFLEX MICROSCOPIC - Abnormal; Notable for the following:    Hgb urine dipstick SMALL (*)    Bilirubin Urine MODERATE (*)    Ketones, ur 40 (*)    Protein, ur 100 (*)    Leukocytes, UA MODERATE (*)    All other components within normal limits  URINALYSIS, MICROSCOPIC (REFLEX) - Abnormal; Notable for the following:    Bacteria, UA MANY (*)    Squamous Epithelial / LPF TOO NUMEROUS TO COUNT (*)    All other components within normal limits  LIPASE, BLOOD    EKG  EKG Interpretation None       Radiology Ct Abdomen Pelvis W Contrast  Result Date:  10/07/2016 CLINICAL DATA:  Abdominal pain EXAM: CT ABDOMEN AND PELVIS WITH CONTRAST TECHNIQUE: Multidetector CT imaging of the abdomen and pelvis was performed using the standard protocol following bolus administration of intravenous contrast. CONTRAST:  184mL ISOVUE-300 IOPAMIDOL (ISOVUE-300) INJECTION 61% COMPARISON:  None. FINDINGS: Lower chest: No acute abnormality. Hepatobiliary: No focal liver abnormality. The gallbladder is normal. No biliary dilatation. Pancreas: There is a normal appearance of the pancreas. Spleen: Spleen is unremarkable Adrenals/Urinary Tract: The adrenal glands both appear normal. 1 cm cyst arises from the midpole of left kidney. Urinary bladder is not well seen. Stomach/Bowel: Moderate size hiatal hernia. No pathologic dilatation of the large or small bowel loops. Vascular/Lymphatic:  Aortic atherosclerosis. No enlarged retroperitoneal lymph nodes. No pelvic or inguinal adenopathy. Reproductive: The uterus is surgically absent. The adnexal structures are not well visualized. Other: There is a marked volume of complex and loculated ascites within the abdomen and pelvis. There is evidence of extensive peritoneal carcinomatosis with omental cake measuring 14.1 x 2.5 by 6.9 cm. There are numerous calcified peritoneal deposits identified predominantly involving the dependent portions of the peritoneal cavity. Musculoskeletal: No acute or significant osseous findings. IMPRESSION: 1. There is evidence of extensive peritoneal carcinomatosis including large volume of loculated ascites, omental caking, and calcified peritoneal deposits. The patient is status post hysterectomy. If the patient still has ovaries then this would be a likely origin. Alternatively GI malignancies may present with peritoneal carcinomatosis. Oncologic consultation is advised. 2. At this time there is no evidence for high-grade bowel obstruction or obstructive uropathy. Electronically Signed   By: Kerby Moors M.D.   On:  10/07/2016 16:35    Procedures Procedures (including critical care time)  Medications Ordered in ED Medications  sodium chloride 0.9 % bolus 1,000 mL (1,000 mLs Intravenous New Bag/Given 10/07/16 1549)  pantoprazole (PROTONIX) injection 40 mg (40 mg Intravenous Given 10/07/16 1549)  iopamidol (ISOVUE-300) 61 % injection 100 mL (100 mLs Intravenous Contrast Given 10/07/16 1610)     Initial Impression / Assessment and Plan / ED Course  I have reviewed the triage vital signs and the nursing notes.  Pertinent labs & imaging results that were available during my care of the patient were reviewed by me and considered in my medical decision making (see chart for details).    CT of the abdomen shows extensive peritoneal carcinomatosis patient will follow-up with oncology next week and is given prescriptions for nausea and also some Prilosec   Final Clinical Impressions(s) / ED Diagnoses   Final diagnoses:  Acute vomiting    New Prescriptions New Prescriptions   OMEPRAZOLE (PRILOSEC) 20 MG CAPSULE    Take 1 capsule (20 mg total) by mouth daily.   ONDANSETRON (ZOFRAN ODT) 4 MG DISINTEGRATING TABLET    4mg  ODT q4 hours prn nausea/vomit     Milton Ferguson, MD 10/07/16 1805

## 2016-10-08 ENCOUNTER — Other Ambulatory Visit (HOSPITAL_COMMUNITY): Payer: Self-pay

## 2016-10-08 ENCOUNTER — Other Ambulatory Visit (HOSPITAL_COMMUNITY): Payer: Self-pay | Admitting: *Deleted

## 2016-10-08 DIAGNOSIS — R180 Malignant ascites: Secondary | ICD-10-CM

## 2016-10-08 DIAGNOSIS — R188 Other ascites: Secondary | ICD-10-CM | POA: Insufficient documentation

## 2016-10-08 DIAGNOSIS — R112 Nausea with vomiting, unspecified: Secondary | ICD-10-CM

## 2016-10-08 MED ORDER — PROMETHAZINE HCL 25 MG RE SUPP: 25.0000 mg | Freq: Four times a day (QID) | RECTAL | 0 refills | Status: DC | PRN

## 2016-10-09 LAB — URINE CULTURE

## 2016-10-11 ENCOUNTER — Ambulatory Visit (HOSPITAL_COMMUNITY)
Admission: RE | Admit: 2016-10-11 | Discharge: 2016-10-11 | Disposition: A | Discharge status: Home / Self Care | Payer: Medicare Other | Source: Ambulatory Visit | Attending: Oncology | Admitting: Oncology

## 2016-10-11 ENCOUNTER — Ambulatory Visit (HOSPITAL_COMMUNITY): Admission: RE | Admit: 2016-10-11 | Payer: Medicare Other | Source: Ambulatory Visit

## 2016-10-11 ENCOUNTER — Encounter (HOSPITAL_BASED_OUTPATIENT_CLINIC_OR_DEPARTMENT_OTHER): Payer: Medicare Other

## 2016-10-11 ENCOUNTER — Encounter (HOSPITAL_COMMUNITY): Payer: Self-pay | Admitting: Oncology

## 2016-10-11 ENCOUNTER — Other Ambulatory Visit (HOSPITAL_COMMUNITY): Payer: Self-pay | Admitting: Oncology

## 2016-10-11 ENCOUNTER — Encounter (HOSPITAL_COMMUNITY): Payer: Medicare Other | Attending: Oncology | Admitting: Oncology

## 2016-10-11 VITALS — BP 103/64 | HR 101 | Temp 98.3°F | Resp 20 | Ht 64.75 in | Wt 147.0 lb

## 2016-10-11 DIAGNOSIS — R180 Malignant ascites: Secondary | ICD-10-CM | POA: Diagnosis not present

## 2016-10-11 DIAGNOSIS — N3000 Acute cystitis without hematuria: Secondary | ICD-10-CM | POA: Diagnosis not present

## 2016-10-11 DIAGNOSIS — D649 Anemia, unspecified: Secondary | ICD-10-CM | POA: Insufficient documentation

## 2016-10-11 DIAGNOSIS — C801 Malignant (primary) neoplasm, unspecified: Secondary | ICD-10-CM | POA: Insufficient documentation

## 2016-10-11 DIAGNOSIS — Q5039 Other congenital malformation of ovary: Secondary | ICD-10-CM | POA: Diagnosis not present

## 2016-10-11 DIAGNOSIS — E119 Type 2 diabetes mellitus without complications: Secondary | ICD-10-CM | POA: Insufficient documentation

## 2016-10-11 DIAGNOSIS — R300 Dysuria: Secondary | ICD-10-CM | POA: Insufficient documentation

## 2016-10-11 DIAGNOSIS — Z7984 Long term (current) use of oral hypoglycemic drugs: Secondary | ICD-10-CM | POA: Insufficient documentation

## 2016-10-11 DIAGNOSIS — C786 Secondary malignant neoplasm of retroperitoneum and peritoneum: Secondary | ICD-10-CM | POA: Diagnosis not present

## 2016-10-11 DIAGNOSIS — K219 Gastro-esophageal reflux disease without esophagitis: Secondary | ICD-10-CM | POA: Insufficient documentation

## 2016-10-11 DIAGNOSIS — Z7982 Long term (current) use of aspirin: Secondary | ICD-10-CM | POA: Diagnosis not present

## 2016-10-11 DIAGNOSIS — E039 Hypothyroidism, unspecified: Secondary | ICD-10-CM | POA: Insufficient documentation

## 2016-10-11 DIAGNOSIS — I10 Essential (primary) hypertension: Secondary | ICD-10-CM | POA: Diagnosis not present

## 2016-10-11 DIAGNOSIS — Z79899 Other long term (current) drug therapy: Secondary | ICD-10-CM | POA: Insufficient documentation

## 2016-10-11 DIAGNOSIS — N289 Disorder of kidney and ureter, unspecified: Secondary | ICD-10-CM | POA: Diagnosis not present

## 2016-10-11 DIAGNOSIS — E7800 Pure hypercholesterolemia, unspecified: Secondary | ICD-10-CM | POA: Diagnosis not present

## 2016-10-11 DIAGNOSIS — R112 Nausea with vomiting, unspecified: Secondary | ICD-10-CM | POA: Diagnosis not present

## 2016-10-11 DIAGNOSIS — Z90710 Acquired absence of both cervix and uterus: Secondary | ICD-10-CM | POA: Insufficient documentation

## 2016-10-11 HISTORY — DX: Secondary malignant neoplasm of retroperitoneum and peritoneum: C78.6

## 2016-10-11 LAB — CBC WITH DIFFERENTIAL/PLATELET
BASOS PCT: 0 %
Basophils Absolute: 0 10*3/uL (ref 0.0–0.1)
EOS ABS: 0 10*3/uL (ref 0.0–0.7)
EOS PCT: 0 %
HCT: 29 % — ABNORMAL LOW (ref 36.0–46.0)
HEMOGLOBIN: 9.2 g/dL — AB (ref 12.0–15.0)
Lymphocytes Relative: 8 %
Lymphs Abs: 0.8 10*3/uL (ref 0.7–4.0)
MCH: 26.1 pg (ref 26.0–34.0)
MCHC: 31.7 g/dL (ref 30.0–36.0)
MCV: 82.2 fL (ref 78.0–100.0)
MONOS PCT: 11 %
Monocytes Absolute: 1 10*3/uL (ref 0.1–1.0)
NEUTROS PCT: 81 %
Neutro Abs: 7.6 10*3/uL (ref 1.7–7.7)
PLATELETS: 422 10*3/uL — AB (ref 150–400)
RBC: 3.53 MIL/uL — ABNORMAL LOW (ref 3.87–5.11)
RDW: 15.3 % (ref 11.5–15.5)
WBC: 9.4 10*3/uL (ref 4.0–10.5)

## 2016-10-11 LAB — VITAMIN B12: Vitamin B-12: 6291 pg/mL — ABNORMAL HIGH (ref 180–914)

## 2016-10-11 LAB — COMPREHENSIVE METABOLIC PANEL
ALBUMIN: 2.8 g/dL — AB (ref 3.5–5.0)
ALK PHOS: 63 U/L (ref 38–126)
ALT: 12 U/L — ABNORMAL LOW (ref 14–54)
ANION GAP: 14 (ref 5–15)
AST: 20 U/L (ref 15–41)
BUN: 31 mg/dL — ABNORMAL HIGH (ref 6–20)
CHLORIDE: 95 mmol/L — AB (ref 101–111)
CO2: 28 mmol/L (ref 22–32)
Calcium: 8.6 mg/dL — ABNORMAL LOW (ref 8.9–10.3)
Creatinine, Ser: 1.23 mg/dL — ABNORMAL HIGH (ref 0.44–1.00)
GFR calc non Af Amer: 42 mL/min — ABNORMAL LOW (ref >60)
GFR, EST AFRICAN AMERICAN: 49 mL/min — AB (ref >60)
GLUCOSE: 117 mg/dL — AB (ref 65–99)
POTASSIUM: 3.6 mmol/L (ref 3.5–5.1)
SODIUM: 137 mmol/L (ref 135–145)
Total Bilirubin: 0.9 mg/dL (ref 0.3–1.2)
Total Protein: 6.8 g/dL (ref 6.5–8.1)

## 2016-10-11 LAB — URINALYSIS, ROUTINE W REFLEX MICROSCOPIC
BILIRUBIN URINE: NEGATIVE
Glucose, UA: NEGATIVE mg/dL
Ketones, ur: 20 mg/dL — AB
Nitrite: NEGATIVE
PROTEIN: 30 mg/dL — AB
Specific Gravity, Urine: 1.02 (ref 1.005–1.030)
pH: 5 (ref 5.0–8.0)

## 2016-10-11 LAB — FERRITIN: FERRITIN: 526 ng/mL — AB (ref 11–307)

## 2016-10-11 LAB — IRON AND TIBC
IRON: 11 ug/dL — AB (ref 28–170)
SATURATION RATIOS: 6 % — AB (ref 10.4–31.8)
TIBC: 199 ug/dL — AB (ref 250–450)
UIBC: 188 ug/dL

## 2016-10-11 LAB — FOLATE: FOLATE: 33.1 ng/mL (ref >5.9)

## 2016-10-11 MED ORDER — CIPROFLOXACIN HCL 500 MG PO TABS: 500.0000 mg | ORAL_TABLET | Freq: Two times a day (BID) | ORAL | 0 refills | Status: DC

## 2016-10-11 MED ORDER — SODIUM CHLORIDE 0.9 % IV SOLN
INTRAVENOUS | Status: DC
  Administered 2016-10-11: 10:00:00 via INTRAVENOUS

## 2016-10-11 MED ORDER — OMEPRAZOLE 20 MG PO CPDR: 20.0000 mg | DELAYED_RELEASE_CAPSULE | Freq: Every day | ORAL | 3 refills | Status: DC

## 2016-10-11 MED ORDER — ONDANSETRON 8 MG PO TBDP: 8.0000 mg | ORAL_TABLET | Freq: Three times a day (TID) | ORAL | 2 refills | Status: DC | PRN
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
## 2016-10-11 NOTE — Progress Notes (Signed)
Paracentesis complete no signs of distress. 2L amber colored ascites removed.

## 2016-10-11 NOTE — Assessment & Plan Note (Deleted)
Peritoneal carcinomatosis, with loculated ascites, omental caking, and peritoneal deposits.  Labs today: CBC diff, CMET, CA 125, CEA, anemia panel.  I personally reviewed and went over laboratory results with the patient.  The results are noted within this dictation.  Order placed for CT chest imaging to complete staging.  Referral to [facility] for consultation.  IVF today, 1 L at 333 cc/hr.  US abdomen with diagnostic paracentesis today.  Antiemetic regimen reviewed.  Referral to IR for port placement in preparation for chemotherapy.  Return in 1- 1.5 weeks for follow-up.

## 2016-10-11 NOTE — Progress Notes (Signed)
Tolerated infusion w/o adverse reaction.  Alert, in no distress.  VSS.  Discharged ambulatory in c/o family.  

## 2016-10-11 NOTE — Assessment & Plan Note (Addendum)
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 Patient is status post hysterectomy, presumed TAH-BSO.  She is not up-to-date on colonoscopy.  Labs today: CBC diff, CMET, CEA, CA-125.  Given her anemia, an anemia panel is also ordered.  I personally reviewed and went over laboratory results with the patient.  The results are noted within this dictation.  I personally reviewed and went over radiographic studies with the patient.  The results are noted within this dictation.  I personally reviewed the images in PACS.  CT abd/pelvis demonstrates extensive peritoneal carcinomatosis including large volume of loculated ascites, omental caking, and calcified peritoneal deposits.   US abdomen and pelvis today.  US paracentesis today for diagnosis.  Will send for cytology.  IVF today for progressive renal insufficiency, likely dehydration.  Order placed for CT chest without contrast to complete staging.  I have refilled her Zofran ODT and Prilosec.  She wishes these be escribed to [facility].  She reports urinary burning.  UA is ordered today.  Refer to [facility] in [place] for consultation.  Return following Gyn Onc referral.   Addendum: UA returned with moderate leukocytes.  Given her urinary burning, Cipro 500 mg BID is escribed to her pharmacy x 5 days.

## 2016-10-12 ENCOUNTER — Encounter (HOSPITAL_COMMUNITY): Payer: Self-pay | Admitting: Oncology

## 2016-10-12 ENCOUNTER — Ambulatory Visit (HOSPITAL_COMMUNITY): Payer: Medicare Other | Admitting: Oncology

## 2016-10-12 ENCOUNTER — Other Ambulatory Visit (HOSPITAL_COMMUNITY): Payer: Self-pay | Admitting: *Deleted

## 2016-10-12 ENCOUNTER — Other Ambulatory Visit (HOSPITAL_COMMUNITY): Payer: Self-pay | Admitting: Oncology

## 2016-10-12 DIAGNOSIS — R609 Edema, unspecified: Secondary | ICD-10-CM

## 2016-10-12 DIAGNOSIS — N3000 Acute cystitis without hematuria: Secondary | ICD-10-CM

## 2016-10-12 DIAGNOSIS — D509 Iron deficiency anemia, unspecified: Secondary | ICD-10-CM

## 2016-10-12 HISTORY — DX: Iron deficiency anemia, unspecified: D50.9

## 2016-10-12 LAB — CEA: CEA1: 1.3 ng/mL (ref 0.0–4.7)

## 2016-10-12 LAB — CA 125: CANCER ANTIGEN (CA) 125: 4902 U/mL — AB (ref 0.0–38.1)

## 2016-10-12 MED ORDER — CIPROFLOXACIN HCL 500 MG PO TABS: 500.0000 mg | ORAL_TABLET | Freq: Two times a day (BID) | ORAL | 0 refills | Status: DC

## 2016-10-14 ENCOUNTER — Telehealth (HOSPITAL_COMMUNITY): Payer: Self-pay | Admitting: Adult Health

## 2016-10-14 DIAGNOSIS — R609 Edema, unspecified: Secondary | ICD-10-CM | POA: Insufficient documentation

## 2016-10-14 MED ORDER — FUROSEMIDE 40 MG PO TABS: 40.0000 mg | ORAL_TABLET | Freq: Every day | ORAL | 0 refills | Status: DC | PRN

## 2016-10-14 NOTE — Telephone Encounter (Signed)
Received call from Dr. Melina Copa with pathology.   Results from recent peritoneal fluid cytology positive for carcinoma most consistent with gynecologic primary malignancy.    Mike Craze, NP [place] [phone]

## 2016-10-18 ENCOUNTER — Encounter (HOSPITAL_COMMUNITY): Payer: Self-pay

## 2016-10-18 ENCOUNTER — Encounter (HOSPITAL_COMMUNITY): Payer: Medicare Other | Attending: Oncology

## 2016-10-18 ENCOUNTER — Ambulatory Visit (HOSPITAL_COMMUNITY)
Admission: RE | Admit: 2016-10-18 | Discharge: 2016-10-18 | Disposition: A | Discharge status: Home / Self Care | Payer: Medicare Other | Source: Ambulatory Visit | Attending: Oncology | Admitting: Oncology

## 2016-10-18 VITALS — BP 134/65 | HR 93 | Temp 98.0°F | Resp 18 | Wt 147.0 lb

## 2016-10-18 DIAGNOSIS — I10 Essential (primary) hypertension: Secondary | ICD-10-CM | POA: Insufficient documentation

## 2016-10-18 DIAGNOSIS — J9811 Atelectasis: Secondary | ICD-10-CM | POA: Diagnosis not present

## 2016-10-18 DIAGNOSIS — Z7982 Long term (current) use of aspirin: Secondary | ICD-10-CM | POA: Insufficient documentation

## 2016-10-18 DIAGNOSIS — J90 Pleural effusion, not elsewhere classified: Secondary | ICD-10-CM | POA: Insufficient documentation

## 2016-10-18 DIAGNOSIS — E119 Type 2 diabetes mellitus without complications: Secondary | ICD-10-CM | POA: Insufficient documentation

## 2016-10-18 DIAGNOSIS — E7800 Pure hypercholesterolemia, unspecified: Secondary | ICD-10-CM | POA: Insufficient documentation

## 2016-10-18 DIAGNOSIS — C786 Secondary malignant neoplasm of retroperitoneum and peritoneum: Secondary | ICD-10-CM

## 2016-10-18 DIAGNOSIS — J439 Emphysema, unspecified: Secondary | ICD-10-CM | POA: Diagnosis not present

## 2016-10-18 DIAGNOSIS — Z7984 Long term (current) use of oral hypoglycemic drugs: Secondary | ICD-10-CM | POA: Insufficient documentation

## 2016-10-18 DIAGNOSIS — C801 Malignant (primary) neoplasm, unspecified: Secondary | ICD-10-CM

## 2016-10-18 DIAGNOSIS — D509 Iron deficiency anemia, unspecified: Secondary | ICD-10-CM

## 2016-10-18 DIAGNOSIS — I700 Atherosclerosis of aorta: Secondary | ICD-10-CM | POA: Diagnosis not present

## 2016-10-18 DIAGNOSIS — D649 Anemia, unspecified: Secondary | ICD-10-CM | POA: Insufficient documentation

## 2016-10-18 DIAGNOSIS — E039 Hypothyroidism, unspecified: Secondary | ICD-10-CM | POA: Insufficient documentation

## 2016-10-18 DIAGNOSIS — Z79899 Other long term (current) drug therapy: Secondary | ICD-10-CM | POA: Insufficient documentation

## 2016-10-18 DIAGNOSIS — R300 Dysuria: Secondary | ICD-10-CM | POA: Insufficient documentation

## 2016-10-18 DIAGNOSIS — K219 Gastro-esophageal reflux disease without esophagitis: Secondary | ICD-10-CM | POA: Insufficient documentation

## 2016-10-18 MED ORDER — FERRIC CARBOXYMALTOSE 750 MG/15ML IV SOLN
750.0000 mg | Freq: Once | INTRAVENOUS | Status: AC
  Administered 2016-10-18: 750 mg via INTRAVENOUS
  Filled 2016-10-18: qty 15

## 2016-10-18 MED ORDER — SODIUM CHLORIDE 0.9 % IV SOLN
INTRAVENOUS | Status: DC
  Administered 2016-10-18: 15:00:00 via INTRAVENOUS

## 2016-10-18 NOTE — Patient Instructions (Signed)
[facility] at [facility] Discharge Instructions  RECOMMENDATIONS MADE BY THE CONSULTANT AND ANY TEST RESULTS WILL BE SENT TO YOUR REFERRING PHYSICIAN.  You had your iron infusion today. Follow up as scheduled.  Thank you for choosing [facility] at [facility] to provide your oncology and hematology care.  To afford each patient quality time with our provider, please arrive at least 15 minutes before your scheduled appointment time.    If you have a lab appointment with the Cancer Center please come in thru the  Main Entrance and check in at the main information desk  You need to re-schedule your appointment should you arrive 10 or more minutes late.  We strive to give you quality time with our providers, and arriving late affects you and other patients whose appointments are after yours.  Also, if you no show three or more times for appointments you may be dismissed from the clinic at the providers discretion.     Again, thank you for choosing [facility].  Our hope is that these requests will decrease the amount of time that you wait before being seen by our physicians.       _____________________________________________________________  Should you have questions after your visit to [facility], please contact our office at [phone] between the hours of 8:30 a.m. and 4:30 p.m.  Voicemails left after 4:30 p.m. will not be returned until the following business day.  For prescription refill requests, have your pharmacy contact our office.       Resources For Cancer Patients and their Caregivers ? American Cancer Society: Can assist with transportation, wigs, general needs, runs Look Good Feel Better.        1-888-227-6333 ? Cancer Care: Provides financial assistance, online support groups, medication/co-pay assistance.  1-800-813-HOPE (4673) ? [facility] Assists [place] cancer  patients and their families through emotional , educational and financial support.  336-427-4357 ? [place] DSS Where to apply for food stamps, Medicaid and utility assistance. 336-342-1394 ? [facility]: Transportation to medical appointments. 336-347-2287 ? Social Security Administration: May apply for disability if have a Stage IV cancer. 336-342-7796 1-800-772-1213 ? [place] Aging, Disability and Transit Services: Assists with nutrition, care and transit needs. 336-349-2343  Cancer Center Support Programs: @10RELATIVEDAYS@ > Cancer Support Group  2nd Tuesday of the month 1pm-2pm, Journey Room  > Creative Journey  3rd Tuesday of the month 1130am-1pm, Journey Room  > Look Good Feel Better  1st Wednesday of the month 10am-12 noon, Journey Room (Call American Cancer Society to register 1-800-395-5775)    

## 2016-10-18 NOTE — Progress Notes (Signed)
Patient presents today for iron infusion per MD orders. Patient tolerated infusion without incidence. Patient discharged via wheelchair in stable condition with daughter to radiology for prescheduled appointment. Patient to follow up as scheduled.

## 2016-10-19 ENCOUNTER — Telehealth (HOSPITAL_COMMUNITY): Payer: Self-pay

## 2016-10-19 NOTE — Telephone Encounter (Signed)
Patient called stating her legs are swollen and somebody needs to look after them. Called patient back to be sure she was elevating her legs. She states she is but its not helping much. Explained to patient that she needs to follow up with her PCP concerning this change in swelling. She verbalized understanding.

## 2016-10-20 ENCOUNTER — Ambulatory Visit: Payer: Medicare Other | Attending: Gynecologic Oncology | Admitting: Gynecologic Oncology

## 2016-10-20 ENCOUNTER — Encounter: Payer: Self-pay | Admitting: Gynecologic Oncology

## 2016-10-20 VITALS — BP 118/62 | HR 95 | Temp 98.3°F | Resp 20 | Ht 64.75 in | Wt 139.0 lb

## 2016-10-20 DIAGNOSIS — D509 Iron deficiency anemia, unspecified: Secondary | ICD-10-CM | POA: Diagnosis not present

## 2016-10-20 DIAGNOSIS — Z79899 Other long term (current) drug therapy: Secondary | ICD-10-CM | POA: Diagnosis not present

## 2016-10-20 DIAGNOSIS — E7800 Pure hypercholesterolemia, unspecified: Secondary | ICD-10-CM | POA: Diagnosis not present

## 2016-10-20 DIAGNOSIS — E119 Type 2 diabetes mellitus without complications: Secondary | ICD-10-CM | POA: Diagnosis not present

## 2016-10-20 DIAGNOSIS — Z7982 Long term (current) use of aspirin: Secondary | ICD-10-CM | POA: Diagnosis not present

## 2016-10-20 DIAGNOSIS — I10 Essential (primary) hypertension: Secondary | ICD-10-CM | POA: Insufficient documentation

## 2016-10-20 DIAGNOSIS — R5381 Other malaise: Secondary | ICD-10-CM | POA: Diagnosis not present

## 2016-10-20 DIAGNOSIS — C786 Secondary malignant neoplasm of retroperitoneum and peritoneum: Secondary | ICD-10-CM

## 2016-10-20 DIAGNOSIS — E43 Unspecified severe protein-calorie malnutrition: Secondary | ICD-10-CM | POA: Insufficient documentation

## 2016-10-20 DIAGNOSIS — E039 Hypothyroidism, unspecified: Secondary | ICD-10-CM | POA: Insufficient documentation

## 2016-10-20 DIAGNOSIS — C801 Malignant (primary) neoplasm, unspecified: Secondary | ICD-10-CM

## 2016-10-20 DIAGNOSIS — Z90710 Acquired absence of both cervix and uterus: Secondary | ICD-10-CM | POA: Insufficient documentation

## 2016-10-20 DIAGNOSIS — Z7984 Long term (current) use of oral hypoglycemic drugs: Secondary | ICD-10-CM | POA: Insufficient documentation

## 2016-10-20 NOTE — Progress Notes (Signed)
Consult Note: Gyn-Onc  Consult was requested by Robynn Pane PA for the evaluation of Sherry Hopkins 76 y.o. female  CC:  Chief Complaint  Patient presents with  . Peritoneal carcinomatosis [facility])    Assessment/Plan:  Sherry Hopkins  is a 76 y.o.  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 She has severe protein malnutrition and debilitation from her disease.   I performed a history, physical examination, and personally reviewed the patient's imaging films including the CT abd/pelvis from 10/07/16.  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 I discussed that the treatment approach for this disease is typically combination of cytoreductive surgery and chemotherapy. I discussed that sequencing of this can be either with upfront debulking followed by adjuvant chemotherapy sequentially or neoadjuvant chemotherapy followed by an interval cytoreductive attempt, then additional chemotherapy. This latter approach is associated with a reduced perioperative morbidity at the time of surgery. I discussed that the goal of optimal sequencing is to optimise the likelihood that cytoreductive effect can be optimal to less than 1 cm of residual disease, and would not induce morbidity for the patient that would result in a delay of adjuvant chemotherapy. I discussed that it is an individual decision process that takes into account individual patient health, and preference factors, in addition to the apparent tumor distribution on imaging. I discussed that the overall survival observed in patients is equivalent for both approaches provided that there is an optimal cytoreductive effort at the time of surgery (regardless of the timing of that surgery).  Given that Sherry Hopkins is of such poor performance status and nutritional status I do not think that she is a good candidate for primary upfront debulking surgery.  Instead I am recommending neoadjuvant  chemotherapy with 3 cycles of carboplatin and paclitaxel followed by repeat imaging and interval debulking 3 weeks after cycle 3 should she demonstrate good response to therapy and improved underlying health.  She has follow-up established with the [place] office next week. We will coordinate follow-up visits with them.   HPI: Sherry Hopkins is a 76 year old P2 who is seen in consultation at the request of Robynn Pane PA for peritoneal carcinomatosis.  The patient began feeling abdominal distension in May, 2018. She was seen by an urgent care and a CT chest was normal. She was treated for reflux with medical management. Her symptoms did not resolve.  She was taken to the [facility] ED on 10/07/16 with persistent symptoms of dyspepsia, abdominal distension, anorexia, weight loss, reflux, and occasional emesis.  A CT abd/pelvis was performed which showed evidence of extensive peritoneal carcinomatosis ncluding large volume of loculated ascites, omental caking, and calcified peritoneal deposits. The patient is status post hysterectomy. If the patient still has ovaries then this would be a likely origin. Alternatively GI malignancies may present with peritoneal carcinomatosis. At this time there is no evidence for high-grade bowel obstruction or obstructive uropathy.  She CA 125 drawn on 10/11/16 which was elevated at 4,702.  Of note, total albumin was decreased at 2.9 indicating severe malnutrition.  CT was performed of the chest on 10/19/16 which was unremarkable for evidence of metastatic disease.  She is an otherwise fairly healthy woman with a history of hypertension, diet controlled DM and hypothyroidism. She has had a prior abdominal hysterectomy (not oophorectomy) for abnormal bleeding in her 40's.   Current Meds:  Outpatient Encounter Prescriptions as of 10/20/2016  Medication Sig  .  aspirin EC 81 MG tablet Take 81 mg by mouth every morning.  . Calcium Carbonate-Vitamin D  (CALTRATE 600+D) 600-400 MG-UNIT per tablet Take 1 tablet by mouth every evening.  . furosemide (LASIX) 40 MG tablet Take 1 tablet (40 mg total) by mouth daily as needed.  Marland Kitchen levothyroxine (SYNTHROID, LEVOTHROID) 25 MCG tablet Take 25 mcg by mouth daily before breakfast.  . metFORMIN (GLUCOPHAGE-XR) 500 MG 24 hr tablet Take 500 mg by mouth daily.  . naproxen sodium (ANAPROX) 220 MG tablet Take 220 mg by mouth daily as needed.  Marland Kitchen omeprazole (PRILOSEC) 20 MG capsule Take 1 capsule (20 mg total) by mouth daily.  . ondansetron (ZOFRAN ODT) 8 MG disintegrating tablet Take 1 tablet (8 mg total) by mouth every 8 (eight) hours as needed for nausea or vomiting.  . pravastatin (PRAVACHOL) 80 MG tablet Take 80 mg by mouth every evening.  . valsartan (DIOVAN) 80 MG tablet Take 80 mg by mouth daily.  . [DISCONTINUED] ciprofloxacin (CIPRO) 500 MG tablet Take 1 tablet (500 mg total) by mouth 2 (two) times daily.  . enalapril (VASOTEC) 5 MG tablet Take 5 mg by mouth every morning.   No facility-administered encounter medications on file as of 10/20/2016.     Allergy: No Known Allergies  Social Hx:   Social History   Social History  . Marital status: Divorced    Spouse name: N/A  . Number of children: N/A  . Years of education: N/A   Occupational History  . Not on file.   Social History Main Topics  . Smoking status: Never Smoker  . Smokeless tobacco: Never Used  . Alcohol use No  . Drug use: No  . Sexual activity: Yes    Birth control/ protection: Surgical   Other Topics Concern  . Not on file   Social History Narrative  . No narrative on file    Past Surgical Hx:  Past Surgical History:  Procedure Laterality Date  . ABDOMINAL HYSTERECTOMY    . CATARACT EXTRACTION W/PHACO  08/23/2011   Procedure: CATARACT EXTRACTION PHACO AND INTRAOCULAR LENS PLACEMENT (IOC);  Surgeon: Tonny Branch, MD;  Location: [facility];  Service: Ophthalmology;  Laterality: Left;  CDE:15.60  . CATARACT EXTRACTION  W/PHACO Right 08/16/2013   Procedure: CATARACT EXTRACTION PHACO AND INTRAOCULAR LENS PLACEMENT (IOC);  Surgeon: Tonny Branch, MD;  Location: [facility];  Service: Ophthalmology;  Laterality: Right;  CDE:8.18    Past Medical Hx:  Past Medical History:  Diagnosis Date  . Diabetes mellitus type 2, diet-controlled (HCC)    diet controlled  . Hypercholesteremia   . Hypertension   . Hypothyroidism   . Iron deficiency anemia 10/12/2016  . Peritoneal carcinomatosis ([place]) 10/11/2016    Past Gynecological History:  SVD x 2 No LMP recorded. Patient has had a hysterectomy.  Family Hx: History reviewed. No pertinent family history.  Review of Systems:  Constitutional  Feels fatigued and unwell,    ENT Normal appearing ears and nares bilaterally.  Skin/Breast  No rash, sores, jaundice, itching, dryness Cardiovascular  No chest pain, shortness of breath, or edema  Pulmonary  No cough or wheeze.  Gastro Intestinal  + anorexia, + decreased BM, no emesis  Genito Urinary  No frequency, urgency, dysuria,  Musculo Skeletal  No myalgia, arthralgia, joint swelling or pain  Neurologic  No weakness, numbness, change in gait,  Psychology  No depression, anxiety, insomnia.   Vitals:  Blood pressure 118/62, pulse 95, temperature 98.3 F (36.8 C), resp. rate  20, height 5' 4.75" (1.645 m), weight 139 lb (63 kg), SpO2 100 %.  Physical Exam: WD Sunken cheeks Neck  Supple NROM, without any enlargements.  Lymph Node Survey No cervical supraclavicular or inguinal adenopathy Cardiovascular  Pulse normal rate, regularity and rhythm. S1 and S2 normal.  Lungs  Clear to auscultation bilateraly, without wheezes/crackles/rhonchi. Good air movement.  Skin  No rash/lesions/breakdown  Psychiatry  Alert and oriented to person, place, and time  Abdomen  Normoactive bowel sounds, abdomen soft, non-tender and thin, slightly distended with fluid without evidence of hernia. Firm mass in lower abdomen consistent with  omental cake. Back No CVA tenderness Genito Urinary  Vulva/vagina: Normal external female genitalia.   No lesions. No discharge or bleeding.  Bladder/urethra:  No lesions or masses, well supported bladder  Vagina: normal  Adnexa: irregular nodular masses filling pelvis . Rectal  + cul de sac nodularity.  Extremities  No bilateral cyanosis, clubbing or edema.   Donaciano Eva, MD  10/20/2016, 5:37 PM

## 2016-10-20 NOTE — Patient Instructions (Signed)
You have the diagnosis of stage 3 ovarian cancer. This will be treated with 3 cycles (doses) of chemotherapy followed by (possibly) a surgery, and then 3 more cycles of chemotherapy.  You should follow-up at [facility] as scheduled on 10/26/16 to discuss starting chemotherapy.  Your follow-up with Dr Denman George will be scheduled after your 3rd dose of chemotherapy.

## 2016-10-21 ENCOUNTER — Ambulatory Visit (HOSPITAL_COMMUNITY): Payer: Medicare Other

## 2016-10-25 MED ORDER — PROCHLORPERAZINE MALEATE 10 MG PO TABS: 10.0000 mg | ORAL_TABLET | Freq: Four times a day (QID) | ORAL | 2 refills | Status: DC | PRN

## 2016-10-25 MED ORDER — LIDOCAINE-PRILOCAINE 2.5-2.5 % EX CREA: TOPICAL_CREAM | CUTANEOUS | 2 refills | Status: DC

## 2016-10-25 NOTE — Patient Instructions (Signed)
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  We are going to treat you with carboplatin and taxol. This treatment is with curative intent.   You will see the doctor regularly throughout treatment.  We monitor your lab work prior to every treatment.  The doctor monitors your response to treatment by the way you are feeling, your blood work, and scans periodically.  There will be wait times while you are here for treatment.  It will take about 30 minutes to 1 hour for your lab work to result.  There will be times while pharmacy mixes your medications.   You will have the following premedications prior to receiving chemotherapy: Premeds: Benadryl:  Help prevent a reaction to the chemotherapy.  Pepcid: antihistamine premed Aloxi - high powered nausea/vomiting prevention medication used for chemotherapy patients.  Dexamethasone - steroid - given to reduce the risk of you having an allergic type reaction to the chemotherapy. Dex can cause you to feel energized, nervous/anxious/jittery, make you have trouble sleeping, and/or make you feel hot/flushed in the face/neck and/or look pink/red in the face/neck. These side effects will pass as the Dex wears off. (takes 20 minutes to infuse)   POTENTIAL SIDE EFFECTS OF TREATMENT:  Carboplatin (Generic Name) Other Names: Paraplatin, CBDCA  About This Drug Carboplatin is a drug used to treat cancer. This drug is given in the vein (IV). This drug takes about 30 minutes to infuse.   Possible Side Effects (More Common) . Nausea and throwing up (vomiting). These symptoms may happen within a few hours after your treatment and may last up to 24 hours. Medicines are available to stop or lessen these side effects. . Bone marrow depression. This is a decrease in the number of white blood cells, red blood cells, and platelets. This may raise your risk of infection, make you tired and  weak (fatigue), and raise your risk of bleeding. . Soreness of the mouth and throat. You may have red areas, white patches, or sores that hurt. . This drug may affect how your kidneys work. Your kidney function will be checked as needed. . Electrolyte changes. Your blood will be checked for electrolyte changes as needed.  Side Effects (Less Common) . Hair loss. Some patients lose their hair on the scalp and body. You may notice your hair thinning seven to 14 days after getting this drug. . Effects on the nerves are called peripheral neuropathy. You may feel numbness, tingling, or pain in your hands and feet. It may be hard for you to button your clothes, open jars, or walk as usual. The effect on the nerves may get worse with more doses of the drug. These effects get better in some people after the drug is stopped but it does not get better in all people. . Loose bowel movements (diarrhea) that may last for several days . Decreased hearing or ringing in the ears . Changes in the way food and drinks taste . Changes in liver function. Your liver function will be checked as needed.  Allergic Reactions Serious allergic reactions including anaphylaxis are rare. While you are getting this drug in your vein (IV), tell your nurse right away if you have any of these symptoms of an allergic reaction: . Trouble catching your breath . Feeling like your tongue or throat are swelling . Feeling your heart beat quickly or in a not normal way (palpitations) . Feeling dizzy or lightheaded .  Flushing, itching, rash, and/or hives  Treating Side Effects . Drink 6-8 cups of fluids each day unless your doctor has told you to limit your fluid intake due to some other health problem. A cup is 8 ounces of fluid. If you throw up or have loose bowel movements, you should drink more fluids so that you do not become dehydrated (lack water in the body from losing too much fluid). . Mouth care is very important. Your mouth  care should consist of routine, gentle cleaning of your teeth or dentures and rinsing your mouth with a mixture of 1/2 teaspoon of salt in 8 ounces of water or  teaspoon of baking soda in 8 ounces of water. This should be done at least after each meal and at bedtime. . If you have mouth sores, avoid mouthwash that has alcohol. Avoid alcohol and smoking because they can bother your mouth and throat. . If you have numbness and tingling in your hands and feet, be careful when cooking, walking, and handling sharp objects and hot liquids. . Talk with your nurse about getting a wig before you lose your hair. Also, call the [facility] at 800-ACS-2345 to find out information about the "Look Good, Feel Better" program close to where you live. It is a free program where women getting chemotherapy can learn about wigs, turbans and scarves as well as makeup techniques and skin and nail care.  Food and Drug Interactions There are no known interactions of carboplatin with food. This drug may interact with other medicines. Tell your doctor and pharmacist about all the medicines and dietary supplements (vitamins, minerals, herbs and others) that you are taking at this time. The safety and use of dietary supplements and alternative diets are often not known. Using these might affect your cancer or interfere with your treatment. Until more is known, you should not use dietary supplements or alternative diets without your doctor's help.  When to Call the Doctor Call your doctor or nurse right away if you have any of these symptoms: . Fever of 100.5 F (38 C) or above; chills . Bleeding or bruising that is not normal . Wheezing or trouble breathing . Nausea that stops you from eating or drinking . Throwing up more than once a day . Rash or itching . Loose bowel movements (diarrhea) more than four times a day or diarrhea with weakness or feeling lightheaded . Call your doctor or nurse as soon as possible  if any of these symptoms happen: . Numbness, tingling, decreased feeling or weakness in fingers, toes, arms, or legs . Change in hearing, ringing in the ears . Blurred vision or other changes in eyesight . Decreased urine . Yellowing of skin or eyes  Problems and Reproductive Concerns Sexual problems and reproduction concerns may happen. In both men and women, this drug may affect your ability to have children. This cannot be determined before your treatment. Talk with your doctor or nurse if you plan to have children. Ask for information on sperm or egg banking. In men, this drug may interfere with your ability to make sperm, but it should not change your ability to have sexual relations. In women, menstrual bleeding may become irregular or stop while you are getting this drug. Do not assume that you cannot become pregnant if you do not have a menstrual period. Women may go through signs of menopause (change of life) like vaginal dryness or itching. Vaginal lubricants can be used to lessen vaginal dryness, itching,  and pain during sexual relations. Genetic counseling is available for you to talk about the effects of this drug therapy on future pregnancies. Also, a genetic counselor can look at the possible risk of problems in the unborn baby due to this medicine if an exposure happens during pregnancy. . Pregnancy warning: This drug may have harmful effects on the unborn child, so effective methods of birth control should be used during your cancer treatment. . Breast feeding warning: It is not known if this drug passes into breast milk. For this reason, women should talk to their doctor about the risks and benefits of breast feeding during treatment with this drug because this drug may enter the breast milk and badly harm a breast feeding baby.   Paclitaxel (Taxol)  Paclitaxel is a drug used to treat cancer. It is given in the vein (IV).    Possible Side Effects . Hair loss. Hair loss is often  temporary, although with certain medicine, hair loss can sometimes be permanent. Hair loss may happen suddenly or gradually. If you lose hair, you may lose it from your head, face, armpits, pubic area, chest, and/or legs. You may also notice your hair getting thin. . Swelling of your legs, ankles and/or feet (edema) . Flushing . Nausea and throwing up (vomiting) . Loose bowel movements (diarrhea) . Bone marrow depression. This is a decrease in the number of white blood cells, red blood cells, and platelets. This may raise your risk of infection, make you tired and weak (fatigue), and raise your risk of bleeding. . Effects on the nerves are called peripheral neuropathy. You may feel numbness, tingling, or pain in your hands and feet. It may be hard for you to button your clothes, open jars, or walk as usual. The effect on the nerves may get worse with more doses of the drug. These effects get better in some people after the drug is stopped but it does not get better in all people. . Changes in your liver function . Bone, joint and muscle pain . Abnormal EKG . Allergic reaction: Allergic reactions, including anaphylaxis are rare but may happen in some patients. Signs of allergic reaction to this drug may be swelling of the face, feeling like your tongue or throat are swelling, trouble breathing, rash, itching, fever, chills, feeling dizzy, and/or feeling that your heart is beating in a fast or not normal way. If this happens, do not take another dose of this drug. You should get urgent medical treatment. . Infection . Changes in your kidney function. Note: Each of the side effects above was reported in 20% or greater of patients treated with paclitaxel. Not all possible side effects are included above. Warnings and Precautions . Severe bone marrow depression  Side Effects . To help with hair loss, wash with a mild shampoo and avoid washing your hair every day. . Avoid rubbing your scalp, instead,  pat your hair or scalp dry . Avoid coloring your hair . Limit your use of hair spray, electric curlers, blow dryers, and curling irons. . If you are interested in getting a wig, talk to your nurse. You can also call the [facility] at 800-ACS-2345 to find out information about the "Look Good, Feel Better" program close to where you live. It is a free program where women getting chemotherapy can learn about wigs, turbans and scarves as well as makeup techniques and skin and nail care. . Ask your doctor or nurse about medicines that are available to  help stop or lessen diarrhea and/or nausea. . To help with nausea and vomiting, eat small, frequent meals instead of three large meals a day. Choose foods and drinks that are at room temperature. Ask your nurse or doctor about other helpful tips and medicine that is available to help or stop lessen these symptoms. . If you get diarrhea, eat low-fiber foods that are high in protein and calories and avoid foods that can irritate your digestive tracts or lead to cramping. Ask your nurse or doctor about medicine that can lessen or stop your diarrhea. . Mouth care is very important. Your mouth care should consist of routine, gentle cleaning of your teeth or dentures and rinsing your mouth with a mixture of 1/2 teaspoon of salt in 8 ounces of water or  teaspoon of baking soda in 8 ounces of water. This should be done at least after each meal and at bedtime. . If you have mouth sores, avoid mouthwash that has alcohol. Also avoid alcohol and smoking because they can bother your mouth and throat. . Drink plenty of fluids (a minimum of eight glasses per day is recommended). . Take your temperature as your doctor or nurse tells you, and whenever you feel like you may have a fever. . Talk to your doctor or nurse about precautions you can take to avoid infections and bleeding. . Be careful when cooking, walking, and handling sharp objects and hot  liquids.  Food and Drug Interactions . There are no known interactions of paclitaxel with food. . This drug may interact with other medicines. Tell your doctor and pharmacist about all the medicines and dietary supplements (vitamins, minerals, herbs and others) that you are taking at this time. . The safety and use of dietary supplements and alternative diets are often not known. Using these might affect your cancer or interfere with your treatment. Until more is known, you should not use dietary supplements or alternative diets without your cancer doctor's help.  When to Call the Doctor Call your doctor or nurse if you have any of the following symptoms and/or any new or unusual symptoms: . Fever of 100.5 F (38 C) or above . Chills . Redness, pain, warmth, or swelling at the IV site during the infusion . Signs of allergic reaction: swelling of the face, feeling like your tongue or throat are swelling, trouble breathing, rash, itching, fever, chills, feeling dizzy, and/or feeling that your heart is beating in a fast or not normal way . Feeling that your heart is beating in a fast or not normal way (palpitations) . Weight gain of 5 pounds in one week (fluid retention) . Decreased urine or very dark urine . Signs of liver problems: dark urine, pale bowel movements, bad stomach pain, feeling very tired and weak, unusual itching, or yellowing of the eyes or skin . Heavy menstrual period that lasts longer than normal . Easy bruising or bleeding . Nausea that stops you from eating or drinking, and/or that is not relieved by prescribed medicines. . Loose bowel movements (diarrhea) more than 4 times a day or diarrhea with weakness or lightheadedness . Pain in your mouth or throat that makes it hard to eat or drink . Lasting loss of appetite or rapid weight loss of five pounds in a week . Signs of peripheral neuropathy: numbness, tingling, or decreased feeling in fingers or toes; trouble walking or  changes in the way you walk; or feeling clumsy when buttoning clothes, opening jars, or other routine activities .  Joint and muscle pain that is not relieved by prescribed medicines . Extreme fatigue that interferes with normal activities . While you are getting this drug, please tell your nurse right away if you have any pain, redness, or swelling at the site of the IV infusion. . If you think you are pregnant.  Reproduction Warnings . Pregnancy warning: This drug may have harmful effects on the unborn child, it is recommended that effective methods of birth control should be used during your cancer treatment. Let your doctor know right away if you think you may be pregnant. . Breast feeding warning: Women should not breast feed during treatment because this drug could enter the breast milk and cause harm to a breast feeding baby.    SELF CARE ACTIVITIES WHILE ON CHEMOTHERAPY: Hydration Increase your fluid intake 48 hours prior to treatment and drink at least 8 to 12 cups (64 ounces) of water/decaff beverages per day after treatment. You can still have your cup of coffee or soda but these beverages do not count as part of your 8 to 12 cups that you need to drink daily. No alcohol intake.  Medications Continue taking your normal prescription medication as prescribed.  If you start any new herbal or new supplements please let us know first to make sure it is safe.  Mouth Care Have teeth cleaned professionally before starting treatment. Keep dentures and partial plates clean. Use soft toothbrush and do not use mouthwashes that contain alcohol. Biotene is a good mouthwash that is available at most pharmacies or may be ordered by calling [phone]. Use warm salt water gargles (1 teaspoon salt per 1 quart warm water) before and after meals and at bedtime. Or you may rinse with 2 tablespoons of three-percent hydrogen peroxide mixed in eight ounces of water. If you are still having problems with  your mouth or sores in your mouth please call the clinic. If you need dental work, please let the doctor know before you go for your appointment so that we can coordinate the best possible time for you in regards to your chemo regimen. You need to also let your dentist know that you are actively taking chemo. We may need to do labs prior to your dental appointment.   Skin Care Always use sunscreen that has not expired and with SPF (Sun Protection Factor) of 50 or higher. Wear hats to protect your head from the sun. Remember to use sunscreen on your hands, ears, face, & feet.  Use good moisturizing lotions such as udder cream, eucerin, or even Vaseline. Some chemotherapies can cause dry skin, color changes in your skin and nails.    . Avoid long, hot showers or baths. . Use gentle, fragrance-free soaps and laundry detergent. . Use moisturizers, preferably creams or ointments rather than lotions because the thicker consistency is better at preventing skin dehydration. Apply the cream or ointment within 15 minutes of showering. Reapply moisturizer at night, and moisturize your hands every time after you wash them.  Hair Loss (if your doctor says your hair will fall out)  . If your doctor says that your hair is likely to fall out, decide before you begin chemo whether you want to wear a wig. You may want to shop before treatment to match your hair color. . Hats, turbans, and scarves can also camouflage hair loss, although some people prefer to leave their heads uncovered. If you go bare-headed outdoors, be sure to use sunscreen on your scalp. . Cut your  hair short. It eases the inconvenience of shedding lots of hair, but it also can reduce the emotional impact of watching your hair fall out. . Don't perm or color your hair during chemotherapy. Those chemical treatments are already damaging to hair and can enhance hair loss. Once your chemo treatments are done and your hair has grown back, it's OK to  resume dyeing or perming hair. With chemotherapy, hair loss is almost always temporary. But when it grows back, it may be a different color or texture. In older adults who still had hair color before chemotherapy, the new growth may be completely gray.  Often, new hair is very fine and soft.  Infection Prevention Please wash your hands for at least 30 seconds using warm soapy water. Handwashing is the #1 way to prevent the spread of germs. Stay away from sick people or people who are getting over a cold. If you develop respiratory systems such as green/yellow mucus production or productive cough or persistent cough let us know and we will see if you need an antibiotic. It is a good idea to keep a pair of gloves on when going into grocery stores/[employer] to decrease your risk of coming into contact with germs on the carts, etc. Carry alcohol hand gel with you at all times and use it frequently if out in public. If your temperature reaches 100.5 or higher please call the clinic and let us know.  If it is after hours or on the weekend please go to the ER if your temperature is over 100.5.  Please have your own personal thermometer at home to use.    Sex and bodily fluids If you are going to have sex, a condom must be used to protect the person that isn't taking chemotherapy. Chemo can decrease your libido (sex drive). For a few days after chemotherapy, chemotherapy can be excreted through your bodily fluids.  When using the toilet please close the lid and flush the toilet twice.  Do this for a few day after you have had chemotherapy.   Effects of chemotherapy on your sex life Some changes are simple and won't last long. They won't affect your sex life permanently. Sometimes you may feel: . too tired . not strong enough to be very active . sick or sore  . not in the mood . anxious or low Your anxiety might not seem related to sex. For example, you may be worried about the cancer and how your treatment is  going. Or you may be worried about money, or about how you family are coping with your illness. These things can cause stress, which can affect your interest in sex. It's important to talk to your partner about how you feel. Remember - the changes to your sex life don't usually last long. There's usually no medical reason to stop having sex during chemo. The drugs won't have any long term physical effects on your performance or enjoyment of sex. Cancer can't be passed on to your partner during sex  Contraception It's important to use reliable contraception during treatment. Avoid getting pregnant while you or your partner are having chemotherapy. This is because the drugs may harm the baby. Sometimes chemotherapy drugs can leave a man or woman infertile.  This means you would not be able to have children in the future. You might want to talk to someone about permanent infertility. It can be very difficult to learn that you may no longer be able to have children. Some  people find counselling helpful. There might be ways to preserve your fertility, although this is easier for men than for women. You may want to speak to a fertility expert. You can talk about sperm banking or harvesting your eggs. You can also ask about other fertility options, such as donor eggs. If you have or have had breast cancer, your doctor might advise you not to take the contraceptive pill. This is because the hormones in it might affect the cancer.  It is not known for sure whether or not chemotherapy drugs can be passed on through semen or secretions from the vagina. Because of this some doctors advise people to use a barrier method if you have sex during treatment. This applies to vaginal, anal or oral sex. Generally, doctors advise a barrier method only for the time you are actually having the treatment and for about a week after your treatment. Advice like this can be worrying, but this does not mean that you have to avoid being  intimate with your partner. You can still have close contact with your partner and continue to enjoy sex.  Animals If you have cats or birds we just ask that you not change the litter or change the cage.  Please have someone else do this for you while you are on chemotherapy.   Food Safety During and After Cancer Treatment Food safety is important for people both during and after cancer treatment. Cancer and cancer treatments, such as chemotherapy, radiation therapy, and stem cell/bone marrow transplantation, often weaken the immune system. This makes it harder for your body to protect itself from foodborne illness, also called food poisoning. Foodborne illness is caused by eating food that contains harmful bacteria, parasites, or viruses.  Foods to avoid Some foods have a higher risk of becoming tainted with bacteria. These include: Marland Kitchen Unwashed fresh fruit and vegetables, especially leafy vegetables that can hide dirt and other contaminants . Raw sprouts, such as alfalfa sprouts . Raw or undercooked beef, especially ground beef, or other raw or undercooked meat and poultry . Fatty, fried, or spicy foods immediately before or after treatment.  These can sit heavy on your stomach and make you feel nauseous. . Raw or undercooked shellfish, such as oysters. . Sushi and sashimi, which often contain raw fish.  . Unpasteurized beverages, such as unpasteurized fruit juices, raw milk, raw yogurt, or cider . Undercooked eggs, such as soft boiled, over easy, and poached; raw, unpasteurized eggs; or foods made with raw egg, such as homemade raw cookie dough and homemade mayonnaise Simple steps for food safety Shop smart. . Do not buy food stored or displayed in an unclean area. . Do not buy bruised or damaged fruits or vegetables. . Do not buy cans that have cracks, dents, or bulges. . Pick up foods that can spoil at the end of your shopping trip and store them in a cooler on the way home. Prepare and  clean up foods carefully. . Rinse all fresh fruits and vegetables under running water, and dry them with a clean towel or paper towel. . Clean the top of cans before opening them. . After preparing food, wash your hands for 20 seconds with hot water and soap. Pay special attention to areas between fingers and under nails. . Clean your utensils and dishes with hot water and soap. Marland Kitchen Disinfect your kitchen and cutting boards using 1 teaspoon of liquid, unscented bleach mixed into 1 quart of water.   Dispose of old food. Marland Kitchen  Eat canned and packaged food before its expiration date (the "use by" or "best before" date). . Consume refrigerated leftovers within 3 to 4 days. After that time, throw out the food. Even if the food does not smell or look spoiled, it still may be unsafe. Some bacteria, such as Listeria, can grow even on foods stored in the refrigerator if they are kept for too long. Take precautions when eating out. . At restaurants, avoid buffets and salad bars where food sits out for a long time and comes in contact with many people. Food can become contaminated when someone with a virus, often a norovirus, or another "bug" handles it. . Put any leftover food in a "to-go" container yourself, rather than having the server do it. And, refrigerate leftovers as soon as you get home. . Choose restaurants that are clean and that are willing to prepare your food as you order it cooked.    MEDICATIONS:                                                                                                                                                              Zofran/Ondansetron 8mg  tablet. Take 1 tablet every 8 hours as needed for nausea/vomiting. (#1 nausea med to take, this can constipate)  Compazine/Prochlorperazine 10mg  tablet. Take 1 tablet every 6 hours as needed for nausea/vomiting. (#2 nausea med to take, this can make you sleepy)   EMLA cream. Apply a quarter size amount to port site 1 hour  prior to chemo. Do not rub in. Cover with plastic wrap.   Over-the-Counter Meds:  Miralax 1 capful in 8 oz of fluid daily. May increase to two times a day if needed. This is a stool softener. If this doesn't work proceed you can add:  Senokot S-start with 1 tablet two times a day and increase to 4 tablets two times a day if needed. (total of 8 tablets in a 24 hour period). This is a stimulant laxative.   Call us if this does not help your bowels move.   Imodium 2mg  capsule. Take 2 capsules after the 1st loose stool and then 1 capsule every 2 hours until you go a total of 12 hours without having a loose stool. Call the [facility] if loose stools continue. If diarrhea occurs @ bedtime, take 2 capsules @ bedtime. Then take 2 capsules every 4 hours until morning. Call [facility].   Diarrhea Sheet  If you are having loose stools/diarrhea, please purchase Imodium and begin taking as outlined:  At the first sign of poorly formed or loose stools you should begin taking Imodium(loperamide) 2 mg capsules.  Take two caplets (4mg ) followed by one caplet (2mg ) every 2 hours until you have had no diarrhea for 12 hours.  During the night  take two caplets (4mg ) at bedtime and continue every 4 hours during the night until the morning.  Stop taking Imodium only after there is no sign of diarrhea for 12 hours.    Always call the [facility] if you are having loose stools/diarrhea that you can't get under control.  Loose stools/disrrhea leads to dehydration (loss of water) in your body.  We have other options of trying to get the loose stools/diarrhea to stopped but you must let us know!    Constipation Sheet *Miralax in 8 oz of fluid daily.  May increase to two times a day if needed.  This is a stool softener.  If this not enough to keep your bowel regular:  You can add:  *Senokot S, start with one tablet twice a day and can increase to 4 tablets twice a day if needed.  This is a stimulant  laxative.   Sometimes when you take pain medication you need BOTH a medicine to keep your stool soft and a medicine to help your bowel push it out!  Please call if the above does not work for you.   Do not go more than 2 days without a bowel movement.  It is very important that you do not become constipated.  It will make you feel sick to your stomach (nausea) and can cause abdominal pain and vomiting.    Nausea Sheet  Zofran/Ondansetron 8mg  tablet. Take 1 tablet every 8 hours as needed for nausea/vomiting. (#1 nausea med to take, this can constipate)  Compazine/Prochlorperazine 10mg  tablet. Take 1 tablet every 6 hours as needed for nausea/vomiting. (#2 nausea med to take, this can make you sleepy)  You can take these medications together or separately.  We would first like for you to try the Ondansetron by itself and then take the Prochloperizine if needed. But you are allowed to take both medications at the same time if your nausea is that severe.  If you are having persistent nausea (nausea that does not stop) please take these medications on a staggered schedule so that the nausea medication stays in your body.  Please call the [facility] and let us know the amount of nausea that you are experiencing.  If you begin to vomit, you need to call the [facility] and if it is the weekend and you have vomited more than one time and cant get it to stop-go to the Emergency Room.  Persistent nausea/vomiting can lead to dehydration (loss of fluid in your body) and will make you feel terrible.   Ice chips, sips of clear liquids, foods that are @ room temperature, crackers, and toast tend to be better tolerated.     SYMPTOMS TO REPORT AS SOON AS POSSIBLE AFTER TREATMENT:  FEVER GREATER THAN 100.5 F  CHILLS WITH OR WITHOUT FEVER  NAUSEA AND VOMITING THAT IS NOT CONTROLLED WITH YOUR NAUSEA MEDICATION  UNUSUAL SHORTNESS OF BREATH  UNUSUAL BRUISING OR BLEEDING  TENDERNESS IN MOUTH AND  THROAT WITH OR WITHOUT PRESENCE OF ULCERS  URINARY PROBLEMS  BOWEL PROBLEMS  UNUSUAL RASH    Wear comfortable clothing and clothing appropriate for easy access to any Portacath or PICC line. Let us know if there is anything that we can do to make your therapy better!    What to do if you need assistance after hours or on the weekends: CALL [phone].  HOLD on the line, do not hang up.  You will hear multiple messages but at the end you will  be connected with a nurse triage line.  They will contact the doctor if necessary.  Most of the time they will be able to assist you.  Do not call the hospital operator.    I have been informed and understand all of the instructions given to me and have received a copy. I have been instructed to call the clinic [phone] or my family physician as soon as possible for continued medical care, if indicated. I do not have any more questions at this time but understand that I may call the [facility] or the Patient Navigator at [phone] during office hours should I have questions or need assistance in obtaining follow-up care.

## 2016-10-26 ENCOUNTER — Other Ambulatory Visit (HOSPITAL_COMMUNITY): Payer: Self-pay | Admitting: Pharmacist

## 2016-10-26 ENCOUNTER — Ambulatory Visit (HOSPITAL_COMMUNITY): Payer: Medicare Other

## 2016-10-26 NOTE — Patient Instructions (Signed)
[facility]   CHEMOTHERAPY INSTRUCTIONS  You will have the following premedications prior to receiving chemotherapy: Premeds: Benadryl:  Help prevent a reaction to the chemotherapy.  Pepcid: antihistamine premed Aloxi - high powered nausea/vomiting prevention medication used for chemotherapy patients.  Dexamethasone - steroid - given to reduce the risk of you having an allergic type reaction to the chemotherapy. Dex can cause you to feel energized, nervous/anxious/jittery, make you have trouble sleeping, and/or make you feel hot/flushed in the face/neck and/or look pink/red in the face/neck. These side effects will pass as the Dex wears off. (takes 20 minutes to infuse)  POTENTIAL SIDE EFFECTS OF TREATMENT:  Carboplatin (Generic Name) Other Names: Paraplatin, CBDCA  About This Drug Carboplatin is a drug used to treat cancer. This drug is given in the vein (IV). This drug takes about 30 minutes to infuse.   Possible Side Effects (More Common) . Nausea and throwing up (vomiting). These symptoms may happen within a few hours after your treatment and may last up to 24 hours. Medicines are available to stop or lessen these side effects. . Bone marrow depression. This is a decrease in the number of white blood cells, red blood cells, and platelets. This may raise your risk of infection, make you tired and weak (fatigue), and raise your risk of bleeding. . Soreness of the mouth and throat. You may have red areas, white patches, or sores that hurt. . This drug may affect how your kidneys work. Your kidney function will be checked as needed. . Electrolyte changes. Your blood will be checked for electrolyte changes as needed.  Side Effects (Less Common) . Hair loss. Some patients lose their hair on the scalp and body. You may notice your hair thinning seven to 14 days after getting this drug. . Effects on the nerves are called peripheral neuropathy. You may feel  numbness, tingling, or pain in your hands and feet. It may be hard for you to button your clothes, open jars, or walk as usual. The effect on the nerves may get worse with more doses of the drug. These effects get better in some people after the drug is stopped but it does not get better in all people. . Loose bowel movements (diarrhea) that may last for several days . Decreased hearing or ringing in the ears . Changes in the way food and drinks taste . Changes in liver function. Your liver function will be checked as needed.  Allergic Reactions Serious allergic reactions including anaphylaxis are rare. While you are getting this drug in your vein (IV), tell your nurse right away if you have any of these symptoms of an allergic reaction: . Trouble catching your breath . Feeling like your tongue or throat are swelling . Feeling your heart beat quickly or in a not normal way (palpitations) . Feeling dizzy or lightheaded . Flushing, itching, rash, and/or hives  Treating Side Effects . Drink 6-8 cups of fluids each day unless your doctor has told you to limit your fluid intake due to some other health problem. A cup is 8 ounces of fluid. If you throw up or have loose bowel movements, you should drink more fluids so that you do not become dehydrated (lack water in the body from losing too much fluid). . Mouth care is very important. Your mouth care should consist of routine, gentle cleaning of your teeth or dentures and rinsing your mouth with a mixture of 1/2 teaspoon of salt in 8   ounces of water or  teaspoon of baking soda in 8 ounces of water. This should be done at least after each meal and at bedtime. . If you have mouth sores, avoid mouthwash that has alcohol. Avoid alcohol and smoking because they can bother your mouth and throat. . If you have numbness and tingling in your hands and feet, be careful when cooking, walking, and handling sharp objects and hot liquids. . Talk with your nurse about  getting a wig before you lose your hair. Also, call the [employer] at 800-ACS-2345 to find out information about the "Look Good, Feel Better" program close to where you live. It is a free program where women getting chemotherapy can learn about wigs, turbans and scarves as well as makeup techniques and skin and nail care.  Food and Drug Interactions There are no known interactions of carboplatin with food. This drug may interact with other medicines. Tell your doctor and pharmacist about all the medicines and dietary supplements (vitamins, minerals, herbs and others) that you are taking at this time. The safety and use of dietary supplements and alternative diets are often not known. Using these might affect your cancer or interfere with your treatment. Until more is known, you should not use dietary supplements or alternative diets without your doctor's help.  When to Call the Doctor Call your doctor or nurse right away if you have any of these symptoms: . Fever of 100.5 F (38 C) or above; chills . Bleeding or bruising that is not normal . Wheezing or trouble breathing . Nausea that stops you from eating or drinking . Throwing up more than once a day . Rash or itching . Loose bowel movements (diarrhea) more than four times a day or diarrhea with weakness or feeling lightheaded . Call your doctor or nurse as soon as possible if any of these symptoms happen: . Numbness, tingling, decreased feeling or weakness in fingers, toes, arms, or legs . Change in hearing, ringing in the ears . Blurred vision or other changes in eyesight . Decreased urine . Yellowing of skin or eyes  Problems and Reproductive Concerns Sexual problems and reproduction concerns may happen. In both men and women, this drug may affect your ability to have children. This cannot be determined before your treatment. Talk with your doctor or nurse if you plan to have children. Ask for information on sperm or egg  banking. In men, this drug may interfere with your ability to make sperm, but it should not change your ability to have sexual relations. In women, menstrual bleeding may become irregular or stop while you are getting this drug. Do not assume that you cannot become pregnant if you do not have a menstrual period. Women may go through signs of menopause (change of life) like vaginal dryness or itching. Vaginal lubricants can be used to lessen vaginal dryness, itching, and pain during sexual relations. Genetic counseling is available for you to talk about the effects of this drug therapy on future pregnancies. Also, a genetic counselor can look at the possible risk of problems in the unborn baby due to this medicine if an exposure happens during pregnancy. . Pregnancy warning: This drug may have harmful effects on the unborn child, so effective methods of birth control should be used during your cancer treatment. . Breast feeding warning: It is not known if this drug passes into breast milk. For this reason, women should talk to their doctor about the risks and benefits of breast   feeding during treatment with this drug because this drug may enter the breast milk and badly harm a breast feeding baby.   Paclitaxel (Taxol)  Paclitaxel is a drug used to treat cancer. It is given in the vein (IV).  This will take 1 hour to infuse.   Possible Side Effects . Hair loss. Hair loss is often temporary, although with certain medicine, hair loss can sometimes be permanent. Hair loss may happen suddenly or gradually. If you lose hair, you may lose it from your head, face, armpits, pubic area, chest, and/or legs. You may also notice your hair getting thin. . Swelling of your legs, ankles and/or feet (edema) . Flushing . Nausea and throwing up (vomiting) . Loose bowel movements (diarrhea) . Bone marrow depression. This is a decrease in the number of white blood cells, red blood cells, and platelets. This may raise  your risk of infection, make you tired and weak (fatigue), and raise your risk of bleeding. . Effects on the nerves are called peripheral neuropathy. You may feel numbness, tingling, or pain in your hands and feet. It may be hard for you to button your clothes, open jars, or walk as usual. The effect on the nerves may get worse with more doses of the drug. These effects get better in some people after the drug is stopped but it does not get better in all people. . Changes in your liver function . Bone, joint and muscle pain . Abnormal EKG . Allergic reaction: Allergic reactions, including anaphylaxis are rare but may happen in some patients. Signs of allergic reaction to this drug may be swelling of the face, feeling like your tongue or throat are swelling, trouble breathing, rash, itching, fever, chills, feeling dizzy, and/or feeling that your heart is beating in a fast or not normal way. If this happens, do not take another dose of this drug. You should get urgent medical treatment. . Infection . Changes in your kidney function. Note: Each of the side effects above was reported in 20% or greater of patients treated with paclitaxel. Not all possible side effects are included above. Warnings and Precautions . Severe bone marrow depression  Side Effects . To help with hair loss, wash with a mild shampoo and avoid washing your hair every day. . Avoid rubbing your scalp, instead, pat your hair or scalp dry . Avoid coloring your hair . Limit your use of hair spray, electric curlers, blow dryers, and curling irons. . If you are interested in getting a wig, talk to your nurse. You can also call the [employer] at 800-ACS-2345 to find out information about the "Look Good, Feel Better" program close to where you live. It is a free program where women getting chemotherapy can learn about wigs, turbans and scarves as well as makeup techniques and skin and nail care. . Ask your doctor or nurse  about medicines that are available to help stop or lessen diarrhea and/or nausea. . To help with nausea and vomiting, eat small, frequent meals instead of three large meals a day. Choose foods and drinks that are at room temperature. Ask your nurse or doctor about other helpful tips and medicine that is available to help or stop lessen these symptoms. . If you get diarrhea, eat low-fiber foods that are high in protein and calories and avoid foods that can irritate your digestive tracts or lead to cramping. Ask your nurse or doctor about medicine that can lessen or stop your diarrhea. . Mouth   care is very important. Your mouth care should consist of routine, gentle cleaning of your teeth or dentures and rinsing your mouth with a mixture of 1/2 teaspoon of salt in 8 ounces of water or  teaspoon of baking soda in 8 ounces of water. This should be done at least after each meal and at bedtime. . If you have mouth sores, avoid mouthwash that has alcohol. Also avoid alcohol and smoking because they can bother your mouth and throat. . Drink plenty of fluids (a minimum of eight glasses per day is recommended). . Take your temperature as your doctor or nurse tells you, and whenever you feel like you may have a fever. . Talk to your doctor or nurse about precautions you can take to avoid infections and bleeding. . Be careful when cooking, walking, and handling sharp objects and hot liquids.  Food and Drug Interactions . There are no known interactions of paclitaxel with food. . This drug may interact with other medicines. Tell your doctor and pharmacist about all the medicines and dietary supplements (vitamins, minerals, herbs and others) that you are taking at this time. . The safety and use of dietary supplements and alternative diets are often not known. Using these might affect your cancer or interfere with your treatment. Until more is known, you should not use dietary supplements or alternative diets  without your cancer doctor's help.  When to Call the Doctor Call your doctor or nurse if you have any of the following symptoms and/or any new or unusual symptoms: . Fever of 100.5 F (38 C) or above . Chills . Redness, pain, warmth, or swelling at the IV site during the infusion . Signs of allergic reaction: swelling of the face, feeling like your tongue or throat are swelling, trouble breathing, rash, itching, fever, chills, feeling dizzy, and/or feeling that your heart is beating in a fast or not normal way . Feeling that your heart is beating in a fast or not normal way (palpitations) . Weight gain of 5 pounds in one week (fluid retention) . Decreased urine or very dark urine . Signs of liver problems: dark urine, pale bowel movements, bad stomach pain, feeling very tired and weak, unusual itching, or yellowing of the eyes or skin . Heavy menstrual period that lasts longer than normal . Easy bruising or bleeding . Nausea that stops you from eating or drinking, and/or that is not relieved by prescribed medicines. . Loose bowel movements (diarrhea) more than 4 times a day or diarrhea with weakness or lightheadedness . Pain in your mouth or throat that makes it hard to eat or drink . Lasting loss of appetite or rapid weight loss of five pounds in a week . Signs of peripheral neuropathy: numbness, tingling, or decreased feeling in fingers or toes; trouble walking or changes in the way you walk; or feeling clumsy when buttoning clothes, opening jars, or other routine activities . Joint and muscle pain that is not relieved by prescribed medicines . Extreme fatigue that interferes with normal activities . While you are getting this drug, please tell your nurse right away if you have any pain, redness, or swelling at the site of the IV infusion. . If you think you are pregnant.  Reproduction Warnings . Pregnancy warning: This drug may have harmful effects on the unborn child, it is recommended  that effective methods of birth control should be used during your cancer treatment. Let your doctor know right away if you think you may be   pregnant. . Breast feeding warning: Women should not breast feed during treatment because this drug could enter the breast milk and cause harm to a breast feeding baby.    EDUCATIONAL MATERIALS GIVEN AND REVIEWED: Chemotherapy and you book, nutrition book, information on carboplatin and taxol.   SELF CARE ACTIVITIES WHILE ON CHEMOTHERAPY: Hydration Increase your fluid intake 48 hours prior to treatment and drink at least 8 to 12 cups (64 ounces) of water/decaff beverages per day after treatment. You can still have your cup of coffee or soda but these beverages do not count as part of your 8 to 12 cups that you need to drink daily. No alcohol intake.  Medications Continue taking your normal prescription medication as prescribed.  If you start any new herbal or new supplements please let us know first to make sure it is safe.  Mouth Care Have teeth cleaned professionally before starting treatment. Keep dentures and partial plates clean. Use soft toothbrush and do not use mouthwashes that contain alcohol. Biotene is a good mouthwash that is available at most pharmacies or may be ordered by calling [phone]. Use warm salt water gargles (1 teaspoon salt per 1 quart warm water) before and after meals and at bedtime. Or you may rinse with 2 tablespoons of three-percent hydrogen peroxide mixed in eight ounces of water. If you are still having problems with your mouth or sores in your mouth please call the clinic. If you need dental work, please let the doctor know before you go for your appointment so that we can coordinate the best possible time for you in regards to your chemo regimen. You need to also let your dentist know that you are actively taking chemo. We may need to do labs prior to your dental appointment.   Skin Care Always use sunscreen that has  not expired and with SPF (Sun Protection Factor) of 50 or higher. Wear hats to protect your head from the sun. Remember to use sunscreen on your hands, ears, face, & feet.  Use good moisturizing lotions such as udder cream, eucerin, or even Vaseline. Some chemotherapies can cause dry skin, color changes in your skin and nails.    . Avoid long, hot showers or baths. . Use gentle, fragrance-free soaps and laundry detergent. . Use moisturizers, preferably creams or ointments rather than lotions because the thicker consistency is better at preventing skin dehydration. Apply the cream or ointment within 15 minutes of showering. Reapply moisturizer at night, and moisturize your hands every time after you wash them.  Hair Loss (if your doctor says your hair will fall out)  . If your doctor says that your hair is likely to fall out, decide before you begin chemo whether you want to wear a wig. You may want to shop before treatment to match your hair color. . Hats, turbans, and scarves can also camouflage hair loss, although some people prefer to leave their heads uncovered. If you go bare-headed outdoors, be sure to use sunscreen on your scalp. . Cut your hair short. It eases the inconvenience of shedding lots of hair, but it also can reduce the emotional impact of watching your hair fall out. . Don't perm or color your hair during chemotherapy. Those chemical treatments are already damaging to hair and can enhance hair loss. Once your chemo treatments are done and your hair has grown back, it's OK to resume dyeing or perming hair. With chemotherapy, hair loss is almost always temporary. But when it grows back,   it may be a different color or texture. In older adults who still had hair color before chemotherapy, the new growth may be completely gray.  Often, new hair is very fine and soft.  Infection Prevention Please wash your hands for at least 30 seconds using warm soapy water. Handwashing is the #1 way to  prevent the spread of germs. Stay away from sick people or people who are getting over a cold. If you develop respiratory systems such as green/yellow mucus production or productive cough or persistent cough let us know and we will see if you need an antibiotic. It is a good idea to keep a pair of gloves on when going into grocery stores/[employer] to decrease your risk of coming into contact with germs on the carts, etc. Carry alcohol hand gel with you at all times and use it frequently if out in public. If your temperature reaches 100.5 or higher please call the clinic and let us know.  If it is after hours or on the weekend please go to the ER if your temperature is over 100.5.  Please have your own personal thermometer at home to use.    Sex and bodily fluids If you are going to have sex, a condom must be used to protect the person that isn't taking chemotherapy. Chemo can decrease your libido (sex drive). For a few days after chemotherapy, chemotherapy can be excreted through your bodily fluids.  When using the toilet please close the lid and flush the toilet twice.  Do this for a few day after you have had chemotherapy.   Effects of chemotherapy on your sex life Some changes are simple and won't last long. They won't affect your sex life permanently. Sometimes you may feel: . too tired . not strong enough to be very active . sick or sore  . not in the mood . anxious or low Your anxiety might not seem related to sex. For example, you may be worried about the cancer and how your treatment is going. Or you may be worried about money, or about how you family are coping with your illness. These things can cause stress, which can affect your interest in sex. It's important to talk to your partner about how you feel. Remember - the changes to your sex life don't usually last long. There's usually no medical reason to stop having sex during chemo. The drugs won't have any long term physical effects on your  performance or enjoyment of sex. Cancer can't be passed on to your partner during sex  Contraception It's important to use reliable contraception during treatment. Avoid getting pregnant while you or your partner are having chemotherapy. This is because the drugs may harm the baby. Sometimes chemotherapy drugs can leave a man or woman infertile.  This means you would not be able to have children in the future. You might want to talk to someone about permanent infertility. It can be very difficult to learn that you may no longer be able to have children. Some people find counselling helpful. There might be ways to preserve your fertility, although this is easier for men than for women. You may want to speak to a fertility expert. You can talk about sperm banking or harvesting your eggs. You can also ask about other fertility options, such as donor eggs. If you have or have had breast cancer, your doctor might advise you not to take the contraceptive pill. This is because the hormones in it might affect   the cancer.  It is not known for sure whether or not chemotherapy drugs can be passed on through semen or secretions from the vagina. Because of this some doctors advise people to use a barrier method if you have sex during treatment. This applies to vaginal, anal or oral sex. Generally, doctors advise a barrier method only for the time you are actually having the treatment and for about a week after your treatment. Advice like this can be worrying, but this does not mean that you have to avoid being intimate with your partner. You can still have close contact with your partner and continue to enjoy sex.  Animals If you have cats or birds we just ask that you not change the litter or change the cage.  Please have someone else do this for you while you are on chemotherapy.   Food Safety During and After Cancer Treatment Food safety is important for people both during and after cancer treatment. Cancer and  cancer treatments, such as chemotherapy, radiation therapy, and stem cell/bone marrow transplantation, often weaken the immune system. This makes it harder for your body to protect itself from foodborne illness, also called food poisoning. Foodborne illness is caused by eating food that contains harmful bacteria, parasites, or viruses.  Foods to avoid Some foods have a higher risk of becoming tainted with bacteria. These include: . Unwashed fresh fruit and vegetables, especially leafy vegetables that can hide dirt and other contaminants . Raw sprouts, such as alfalfa sprouts . Raw or undercooked beef, especially ground beef, or other raw or undercooked meat and poultry . Fatty, fried, or spicy foods immediately before or after treatment.  These can sit heavy on your stomach and make you feel nauseous. . Raw or undercooked shellfish, such as oysters. . Sushi and sashimi, which often contain raw fish.  . Unpasteurized beverages, such as unpasteurized fruit juices, raw milk, raw yogurt, or cider . Undercooked eggs, such as soft boiled, over easy, and poached; raw, unpasteurized eggs; or foods made with raw egg, such as homemade raw cookie dough and homemade mayonnaise Simple steps for food safety Shop smart. . Do not buy food stored or displayed in an unclean area. . Do not buy bruised or damaged fruits or vegetables. . Do not buy cans that have cracks, dents, or bulges. . Pick up foods that can spoil at the end of your shopping trip and store them in a cooler on the way home. Prepare and clean up foods carefully. . Rinse all fresh fruits and vegetables under running water, and dry them with a clean towel or paper towel. . Clean the top of cans before opening them. . After preparing food, wash your hands for 20 seconds with hot water and soap. Pay special attention to areas between fingers and under nails. . Clean your utensils and dishes with hot water and soap. . Disinfect your kitchen and  cutting boards using 1 teaspoon of liquid, unscented bleach mixed into 1 quart of water.   Dispose of old food. . Eat canned and packaged food before its expiration date (the "use by" or "best before" date). . Consume refrigerated leftovers within 3 to 4 days. After that time, throw out the food. Even if the food does not smell or look spoiled, it still may be unsafe. Some bacteria, such as Listeria, can grow even on foods stored in the refrigerator if they are kept for too long. Take precautions when eating out. . At restaurants, avoid buffets and   salad bars where food sits out for a long time and comes in contact with many people. Food can become contaminated when someone with a virus, often a norovirus, or another "bug" handles it. . Put any leftover food in a "to-go" container yourself, rather than having the server do it. And, refrigerate leftovers as soon as you get home. . Choose restaurants that are clean and that are willing to prepare your food as you order it cooked.    MEDICATIONS:                                                                                                                                                              Zofran/Ondansetron 8mg tablet. Take 1 tablet every 8 hours as needed for nausea/vomiting. (#1 nausea med to take, this can constipate)  Compazine/Prochlorperazine 10mg tablet. Take 1 tablet every 6 hours as needed for nausea/vomiting. (#2 nausea med to take, this can make you sleepy)   EMLA cream. Apply a quarter size amount to port site 1 hour prior to chemo. Do not rub in. Cover with plastic wrap.   Over-the-Counter Meds:  Miralax 1 capful in 8 oz of fluid daily. May increase to two times a day if needed. This is a stool softener. If this doesn't work proceed you can add:  Senokot S-start with 1 tablet two times a day and increase to 4 tablets two times a day if needed. (total of 8 tablets in a 24 hour period). This is a stimulant laxative.    Call us if this does not help your bowels move.   Imodium 2mg capsule. Take 2 capsules after the 1st loose stool and then 1 capsule every 2 hours until you go a total of 12 hours without having a loose stool. Call the Cancer Center if loose stools continue. If diarrhea occurs @ bedtime, take 2 capsules @ bedtime. Then take 2 capsules every 4 hours until morning. Call Cancer Center.   Diarrhea Sheet  If you are having loose stools/diarrhea, please purchase Imodium and begin taking as outlined:  At the first sign of poorly formed or loose stools you should begin taking Imodium(loperamide) 2 mg capsules.  Take two caplets (4mg) followed by one caplet (2mg) every 2 hours until you have had no diarrhea for 12 hours.  During the night take two caplets (4mg) at bedtime and continue every 4 hours during the night until the morning.  Stop taking Imodium only after there is no sign of diarrhea for 12 hours.    Always call the Cancer Center if you are having loose stools/diarrhea that you can't get under control.  Loose stools/disrrhea leads to dehydration (loss of water) in your body.  We have other options of trying to get the loose stools/diarrhea to stopped but   you must let us know!    Constipation Sheet *Miralax in 8 oz of fluid daily.  May increase to two times a day if needed.  This is a stool softener.  If this not enough to keep your bowel regular:  You can add:  *Senokot S, start with one tablet twice a day and can increase to 4 tablets twice a day if needed.  This is a stimulant laxative.   Sometimes when you take pain medication you need BOTH a medicine to keep your stool soft and a medicine to help your bowel push it out!  Please call if the above does not work for you.   Do not go more than 2 days without a bowel movement.  It is very important that you do not become constipated.  It will make you feel sick to your stomach (nausea) and can cause abdominal pain and  vomiting.    Nausea Sheet  Zofran/Ondansetron 8mg tablet. Take 1 tablet every 8 hours as needed for nausea/vomiting. (#1 nausea med to take, this can constipate)  Compazine/Prochlorperazine 10mg tablet. Take 1 tablet every 6 hours as needed for nausea/vomiting. (#2 nausea med to take, this can make you sleepy)  You can take these medications together or separately.  We would first like for you to try the Ondansetron by itself and then take the Prochloperizine if needed. But you are allowed to take both medications at the same time if your nausea is that severe.  If you are having persistent nausea (nausea that does not stop) please take these medications on a staggered schedule so that the nausea medication stays in your body.  Please call the Cancer Center and let us know the amount of nausea that you are experiencing.  If you begin to vomit, you need to call the Cancer Center and if it is the weekend and you have vomited more than one time and cant get it to stop-go to the Emergency Room.  Persistent nausea/vomiting can lead to dehydration (loss of fluid in your body) and will make you feel terrible.   Ice chips, sips of clear liquids, foods that are @ room temperature, crackers, and toast tend to be better tolerated.     SYMPTOMS TO REPORT AS SOON AS POSSIBLE AFTER TREATMENT:  FEVER GREATER THAN 100.5 F  CHILLS WITH OR WITHOUT FEVER  NAUSEA AND VOMITING THAT IS NOT CONTROLLED WITH YOUR NAUSEA MEDICATION  UNUSUAL SHORTNESS OF BREATH  UNUSUAL BRUISING OR BLEEDING  TENDERNESS IN MOUTH AND THROAT WITH OR WITHOUT PRESENCE OF ULCERS  URINARY PROBLEMS  BOWEL PROBLEMS  UNUSUAL RASH    Wear comfortable clothing and clothing appropriate for easy access to any Portacath or PICC line. Let us know if there is anything that we can do to make your therapy better!    What to do if you need assistance after hours or on the weekends: CALL [phone].  HOLD on the line, do not hang up.   You will hear multiple messages but at the end you will be connected with a nurse triage line.  They will contact the doctor if necessary.  Most of the time they will be able to assist you.  Do not call the hospital operator.    I have been informed and understand all of the instructions given to me and have received a copy. I have been instructed to call the clinic [phone] or my family physician as soon as possible for continued medical care, if indicated.   I do not have any more questions at this time but understand that I may call the Cancer Center or the Patient Navigator at [phone] during office hours should I have questions or need assistance in obtaining follow-up care.             

## 2016-10-27 ENCOUNTER — Other Ambulatory Visit (HOSPITAL_COMMUNITY): Payer: Self-pay | Admitting: Oncology

## 2016-10-27 DIAGNOSIS — C801 Malignant (primary) neoplasm, unspecified: Principal | ICD-10-CM

## 2016-10-27 DIAGNOSIS — C786 Secondary malignant neoplasm of retroperitoneum and peritoneum: Secondary | ICD-10-CM

## 2016-10-28 ENCOUNTER — Ambulatory Visit (HOSPITAL_COMMUNITY)
Admission: RE | Admit: 2016-10-28 | Discharge: 2016-10-28 | Disposition: A | Discharge status: Home / Self Care | Payer: Medicare Other | Source: Ambulatory Visit | Attending: Adult Health | Admitting: Adult Health

## 2016-10-28 ENCOUNTER — Encounter (HOSPITAL_COMMUNITY): Payer: Medicare Other | Attending: Adult Health

## 2016-10-28 ENCOUNTER — Encounter (HOSPITAL_COMMUNITY): Payer: Self-pay

## 2016-10-28 ENCOUNTER — Ambulatory Visit (INDEPENDENT_AMBULATORY_CARE_PROVIDER_SITE_OTHER): Payer: Medicare Other | Admitting: General Surgery

## 2016-10-28 ENCOUNTER — Encounter (HOSPITAL_COMMUNITY)
Admission: RE | Admit: 2016-10-28 | Discharge: 2016-10-28 | Disposition: A | Discharge status: Home / Self Care | Payer: Medicare Other | Source: Ambulatory Visit | Attending: General Surgery | Admitting: General Surgery

## 2016-10-28 ENCOUNTER — Other Ambulatory Visit (HOSPITAL_COMMUNITY): Payer: Self-pay | Admitting: Adult Health

## 2016-10-28 ENCOUNTER — Encounter (HOSPITAL_COMMUNITY): Payer: Medicare Other

## 2016-10-28 ENCOUNTER — Encounter (HOSPITAL_BASED_OUTPATIENT_CLINIC_OR_DEPARTMENT_OTHER): Payer: Medicare Other | Admitting: Adult Health

## 2016-10-28 ENCOUNTER — Encounter: Payer: Self-pay | Admitting: General Surgery

## 2016-10-28 VITALS — BP 124/73 | HR 88 | Temp 97.8°F | Resp 18 | Ht 65.0 in | Wt 132.0 lb

## 2016-10-28 DIAGNOSIS — C786 Secondary malignant neoplasm of retroperitoneum and peritoneum: Secondary | ICD-10-CM

## 2016-10-28 DIAGNOSIS — E860 Dehydration: Secondary | ICD-10-CM

## 2016-10-28 DIAGNOSIS — C481 Malignant neoplasm of specified parts of peritoneum: Secondary | ICD-10-CM

## 2016-10-28 DIAGNOSIS — C482 Malignant neoplasm of peritoneum, unspecified: Secondary | ICD-10-CM

## 2016-10-28 DIAGNOSIS — C801 Malignant (primary) neoplasm, unspecified: Secondary | ICD-10-CM | POA: Insufficient documentation

## 2016-10-28 DIAGNOSIS — K7031 Alcoholic cirrhosis of liver with ascites: Secondary | ICD-10-CM | POA: Diagnosis not present

## 2016-10-28 DIAGNOSIS — G893 Neoplasm related pain (acute) (chronic): Secondary | ICD-10-CM

## 2016-10-28 DIAGNOSIS — F419 Anxiety disorder, unspecified: Secondary | ICD-10-CM

## 2016-10-28 LAB — COMPREHENSIVE METABOLIC PANEL
ALT: 10 U/L — AB (ref 14–54)
AST: 18 U/L (ref 15–41)
Albumin: 2.7 g/dL — ABNORMAL LOW (ref 3.5–5.0)
Alkaline Phosphatase: 65 U/L (ref 38–126)
Anion gap: 13 (ref 5–15)
BUN: 8 mg/dL (ref 6–20)
CHLORIDE: 87 mmol/L — AB (ref 101–111)
CO2: 35 mmol/L — AB (ref 22–32)
CREATININE: 0.73 mg/dL (ref 0.44–1.00)
Calcium: 8.4 mg/dL — ABNORMAL LOW (ref 8.9–10.3)
Glucose, Bld: 89 mg/dL (ref 65–99)
Potassium: 3.1 mmol/L — ABNORMAL LOW (ref 3.5–5.1)
SODIUM: 135 mmol/L (ref 135–145)
Total Bilirubin: 0.6 mg/dL (ref 0.3–1.2)
Total Protein: 6.9 g/dL (ref 6.5–8.1)

## 2016-10-28 LAB — CBC WITH DIFFERENTIAL/PLATELET
BASOS ABS: 0 10*3/uL (ref 0.0–0.1)
Basophils Relative: 0 %
EOS ABS: 0.1 10*3/uL (ref 0.0–0.7)
Eosinophils Relative: 1 %
HCT: 28.2 % — ABNORMAL LOW (ref 36.0–46.0)
HEMOGLOBIN: 8.5 g/dL — AB (ref 12.0–15.0)
Lymphocytes Relative: 16 %
Lymphs Abs: 1 10*3/uL (ref 0.7–4.0)
MCH: 25.6 pg — ABNORMAL LOW (ref 26.0–34.0)
MCHC: 30.1 g/dL (ref 30.0–36.0)
MCV: 84.9 fL (ref 78.0–100.0)
MONO ABS: 0.8 10*3/uL (ref 0.1–1.0)
Monocytes Relative: 12 %
NEUTROS PCT: 71 %
Neutro Abs: 4.5 10*3/uL (ref 1.7–7.7)
Platelets: 388 10*3/uL (ref 150–400)
RBC: 3.32 MIL/uL — AB (ref 3.87–5.11)
RDW: 16.1 % — ABNORMAL HIGH (ref 11.5–15.5)
WBC: 6.4 10*3/uL (ref 4.0–10.5)

## 2016-10-28 MED ORDER — TRAMADOL HCL 50 MG PO TABS: 50.0000 mg | ORAL_TABLET | Freq: Four times a day (QID) | ORAL | 0 refills | Status: DC | PRN

## 2016-10-28 MED ORDER — LORAZEPAM 0.5 MG PO TABS: 0.5000 mg | ORAL_TABLET | Freq: Three times a day (TID) | ORAL | 2 refills | Status: DC

## 2016-10-28 MED ORDER — SODIUM CHLORIDE 0.9 % IV SOLN
Freq: Once | INTRAVENOUS | Status: AC
  Administered 2016-10-28: 09:00:00 via INTRAVENOUS

## 2016-10-28 MED ORDER — POTASSIUM CHLORIDE ER 20 MEQ PO TBCR: 10.0000 meq | EXTENDED_RELEASE_TABLET | Freq: Two times a day (BID) | ORAL | 0 refills | Status: DC

## 2016-10-28 MED ORDER — POLYETHYLENE GLYCOL 3350 17 GM/SCOOP PO POWD: ORAL | 2 refills | Status: DC

## 2016-10-28 NOTE — Progress Notes (Signed)
Patient was briefly seen and examined today. She is seen in a tumor that in the infusion area accompanied by her daughter. She feels weak; she has not been able to tolerate much by mouth in terms of food and fluids. She feels constipated; she is unsure of when her last bowel movement was. Her daughter plans to give her some OTC milk of magnesia. They would like recommendations on what else that should try for constipation.  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 She understands that she has high burden of disease, and we will do the best we can to treat with "curative intent." She becomes emotional as we discussed her current condition and endorses being fearful that everything is going on. I asked the patient if she thought it would be helpful to have an anti-anxiety medication that she can take as tolerated; reports that her mom is not sleeping well because she is worried. Discussed the use of low-dose Ativan that can be taken PRN for anxiety, or N&V when she starts chemotherapy.  Patient agreed to give the Ativan a try.    Endorses intermittent "not too bad" abdominal pain.  She does not have any prescription-strength pain medications.  I suspect her pain may be worse than she reports today.  Discussed the use of Tramadol, which she can take PRN for pain.  Reviewed side effects of opiates including drowsiness, unsteady gait, and worsening constipation. Encouraged her to drink fluids as tolerated.  Paper prescription for Tramadol given to patient today as well.   She appears dehydrated on exam today. Will give her 1L NS over 1 hour via peripheral IV while she is here in clinic today.  Written patient education for constipation given to patient; recommended Miralax OTC once daily with stool softeners.    Joanne Gavel, RN-Nurse Navigator at bedside to provide chemo teaching today as well.   Unfortunately, she  does not have a port-a-cath and it is not safe to administer chemo today.  We have called Dr. Arnoldo Morale' office and he is willing to see the pt today and place port tomorrow.  We discussed the purpose, risks and benefits of port-a-cath placement in cancer patients. I provided her with written patient education regarding the port as well.     Physical Exam:  -Frail, female in no acute distress. Appears fatigued.  -Skin dry.  -Mild tachycardia with normal rhythm.  -Lungs clear to auscultation.  -No peripheral edema.  -Mild abdominal distension; no tenderness on exam.    Patient is no charge for this visit today, as she was planned to start chemotherapy which has been delayed.     Plan:  -See Dr. Arnoldo Morale today for consultation for port-a-cath placement. He will place port tomorrow, 10/29/16.  -Return to cancer center next Wednesday, 11/03/16 for day 1, cycle #1 Carbo/Taxol.  -Follow-up visit 1 week post-chemo for toxicity check.      Mike Craze, NP [place] [phone]

## 2016-10-28 NOTE — Progress Notes (Signed)
Pt discharged via wheelchair in stable condition with daughter.

## 2016-10-28 NOTE — Patient Instructions (Signed)
Implanted Port Insertion  Implanted port insertion is a procedure to put in a port and catheter. The port is a device with an injectable disk that can be accessed by your health care provider. The port is connected to a vein in the chest or neck by a small flexible tube (catheter). There are different types of ports. The implanted port may be used as a long-term IV access for:  · Medicines, such as chemotherapy.  · Fluids.  · Liquid nutrition, such as total parenteral nutrition (TPN).  · Blood samples.    Having a port means that your health care provider will not need to use the veins in your arms for these procedures.  Tell a health care provider about:  · Any allergies you have.  · All medicines you are taking, especially blood thinners, as well as any vitamins, herbs, eye drops, creams, over-the-counter medicines, and steroids.  · Any problems you or family members have had with anesthetic medicines.  · Any blood disorders you have.  · Any surgeries you have had.  · Any medical conditions you have, including diabetes or kidney problems.  · Whether you are pregnant or may be pregnant.  What are the risks?  Generally, this is a safe procedure. However, problems may occur, including:  · Allergic reactions to medicines or dyes.  · Damage to other structures or organs.  · Infection.  · Damage to the blood vessel, bruising, or bleeding at the puncture site.  · Blood clot.  · Breakdown of the skin over the port.  · A collection of air in the chest that can cause one of the lungs to collapse (pneumothorax). This is rare.    What happens before the procedure?  Staying hydrated  Follow instructions from your health care provider about hydration, which may include:  · Up to 2 hours before the procedure - you may continue to drink clear liquids, such as water, clear fruit juice, black coffee, and plain tea.    Eating and drinking restrictions  · Follow instructions from your health care provider about eating and drinking,  which may include:  ? 8 hours before the procedure - stop eating heavy meals or foods such as meat, fried foods, or fatty foods.  ? 6 hours before the procedure - stop eating light meals or foods, such as toast or cereal.  ? 6 hours before the procedure - stop drinking milk or drinks that contain milk.  ? 2 hours before the procedure - stop drinking clear liquids.  Medicines  · Ask your health care provider about:  ? Changing or stopping your regular medicines. This is especially important if you are taking diabetes medicines or blood thinners.  ? Taking medicines such as aspirin and ibuprofen. These medicines can thin your blood. Do not take these medicines before your procedure if your health care provider instructs you not to.  · You may be given antibiotic medicine to help prevent infection.  General instructions  · Plan to have someone take you home from the hospital or clinic.  · If you will be going home right after the procedure, plan to have someone with you for 24 hours.  · You may have blood tests.  · You may be asked to shower with a germ-killing soap.  What happens during the procedure?  · To lower your risk of infection:  ? Your health care team will wash or sanitize their hands.  ? Your skin will be washed with   soap.  ? Hair may be removed from the surgical area.  · An IV tube will be inserted into one of your veins.  · You will be given one or more of the following:  ? A medicine to help you relax (sedative).  ? A medicine to numb the area (local anesthetic).  · Two small cuts (incisions) will be made to insert the port.  ? One incision will be made in your neck to get access to the vein where the catheter will lie.  ? The other incision will be made in the upper chest. This is where the port will lie.  · The procedure may be done using continuous X-ray (fluoroscopy) or other imaging tools for guidance.  · The port and catheter will be placed. There may be a small, raised area where the port  is.  · The port will be flushed with a salt solution (saline), and blood will be drawn to make sure that it is working correctly.  · The incisions will be closed.  · Bandages (dressings) may be placed over the incisions.  The procedure may vary among health care providers and hospitals.  What happens after the procedure?  · Your blood pressure, heart rate, breathing rate, and blood oxygen level will be monitored until the medicines you were given have worn off.  · Do not drive for 24 hours if you were given a sedative.  · You will be given a manufacturer's information card for the type of port that you have. Keep this with you.  · Your port will need to be flushed and checked as told by your health care provider, usually every few weeks.  · A chest X-ray will be done to:  ? Check the placement of the port.  ? Make sure there is no injury to your lung.  Summary  · Implanted port insertion is a procedure to put in a port and catheter.  · The implanted port is used as a long-term IV access.  · The port will need to be flushed and checked as told by your health care provider, usually every few weeks.  · Keep your manufacturer's information card with you at all times.  This information is not intended to replace advice given to you by your health care provider. Make sure you discuss any questions you have with your health care provider.  Document Released: 12/20/2012 Document Revised: 01/21/2016 Document Reviewed: 01/21/2016  Elsevier Interactive Patient Education © 2017 [employer]

## 2016-10-28 NOTE — Addendum Note (Signed)
Addended by: Holley Bouche on: 10/28/2016 10:47 AM   Modules accepted: Orders

## 2016-10-28 NOTE — Addendum Note (Signed)
Addended by: Joanne Gavel T on: 10/28/2016 10:54 AM   Modules accepted: Orders

## 2016-10-28 NOTE — H&P (Signed)
Sherry Hopkins; [id number]; Feb 08, 1941   HPI Patient is a 76 year old black female with peritoneal carcinomatosis who is referred to my care by Dr. Talbert Cage of oncology for Port-A-Cath placement. She is about to start chemotherapy and needs central venous access. She currently has no pain. Past Medical History:  Diagnosis Date  . Diabetes mellitus type 2, diet-controlled ([facility]CC)    diet controlled  . Hypercholesteremia   . Hypertension   . Hypothyroidism   . Iron deficiency anemia 10/12/2016  . Peritoneal carcinomatosis ([place]) 10/11/2016    Past Surgical History:  Procedure Laterality Date  . ABDOMINAL HYSTERECTOMY    . CATARACT EXTRACTION W/PHACO  08/23/2011   Procedure: CATARACT EXTRACTION PHACO AND INTRAOCULAR LENS PLACEMENT (IOC);  Surgeon: Tonny Branch, MD;  Location: [facility];  Service: Ophthalmology;  Laterality: Left;  CDE:15.60  . CATARACT EXTRACTION W/PHACO Right 08/16/2013   Procedure: CATARACT EXTRACTION PHACO AND INTRAOCULAR LENS PLACEMENT (IOC);  Surgeon: Tonny Branch, MD;  Location: [facility];  Service: Ophthalmology;  Laterality: Right;  CDE:8.18    No family history on file.  Current Outpatient Prescriptions on File Prior to Visit  Medication Sig Dispense Refill  . aspirin EC 81 MG tablet Take 81 mg by mouth every morning.    . Calcium Carbonate-Vitamin D (CALTRATE 600+D) 600-400 MG-UNIT per tablet Take 1 tablet by mouth every evening.    Marland Kitchen CARBOPLATIN IV Inject into the vein.    Marland Kitchen enalapril (VASOTEC) 5 MG tablet Take 5 mg by mouth every morning.    . furosemide (LASIX) 40 MG tablet Take 1 tablet (40 mg total) by mouth daily as needed. 30 tablet 0  . levothyroxine (SYNTHROID, LEVOTHROID) 25 MCG tablet Take 25 mcg by mouth daily before breakfast.    . lidocaine-prilocaine (EMLA) cream Apply a quarter size amount to affected area 1 hour prior to coming to chemotherapy. 30 g 2  . LORazepam (ATIVAN) 0.5 MG tablet Take 1 tablet (0.5 mg total) by mouth every 8 (eight) hours. 60 tablet 2   . metFORMIN (GLUCOPHAGE-XR) 500 MG 24 hr tablet Take 500 mg by mouth daily.    . naproxen sodium (ANAPROX) 220 MG tablet Take 220 mg by mouth daily as needed.    Marland Kitchen omeprazole (PRILOSEC) 20 MG capsule Take 1 capsule (20 mg total) by mouth daily. 30 capsule 3  . ondansetron (ZOFRAN ODT) 8 MG disintegrating tablet Take 1 tablet (8 mg total) by mouth every 8 (eight) hours as needed for nausea or vomiting. 30 tablet 2  . PACLitaxel (TAXOL IV) Inject into the vein.    . polyethylene glycol powder (GLYCOLAX/MIRALAX) powder Take 1 capful daily. 255 g 2  . pravastatin (PRAVACHOL) 80 MG tablet Take 80 mg by mouth every evening.    . prochlorperazine (COMPAZINE) 10 MG tablet Take 1 tablet (10 mg total) by mouth every 6 (six) hours as needed for nausea or vomiting. 30 tablet 2  . traMADol (ULTRAM) 50 MG tablet Take 1 tablet (50 mg total) by mouth every 6 (six) hours as needed. 60 tablet 0  . valsartan (DIOVAN) 80 MG tablet Take 80 mg by mouth daily.     No current facility-administered medications on file prior to visit.     No Known Allergies  History  Alcohol Use No    History  Smoking Status  . Never Smoker  Smokeless Tobacco  . Never Used    Review of Systems  Constitutional: Positive for malaise/fatigue.  HENT: Negative.   Eyes: Negative.   Respiratory: Positive  for shortness of breath.   Cardiovascular: Negative.   Gastrointestinal: Positive for abdominal pain, heartburn, nausea and vomiting.  Musculoskeletal: Negative.   Skin: Negative.   Neurological: Positive for weakness.  Psychiatric/Behavioral: Negative.     Objective   Vitals:   10/28/16 1150  BP: 124/73  Pulse: 88  Resp: 18  Temp: 97.8 F (36.6 C)    Physical Exam  Constitutional: She is oriented to person, place, and time and well-developed, well-nourished, and in no distress.  HENT:  Head: Normocephalic and atraumatic.  Cardiovascular: Normal rate and regular rhythm.  Exam reveals no gallop and no friction  rub.   No murmur heard. Pulmonary/Chest: Effort normal and breath sounds normal. No respiratory distress. She has no wheezes. She has no rales.  Neurological: She is alert and oriented to person, place, and time.  Skin: Skin is warm and dry.  Vitals reviewed.  oncology notes reviewed  Assessment  Peritoneal carcinomatosis, need for central venous access Plan   Patient is scheduled for Port-A-Cath insertion on 11/01/2016. The risks and benefits of the procedure including bleeding, infection, and pneumothorax were fully explained to the patient, who gave informed consent. As her potassium is 3.1, she has been ordered K Dur 20 mEq twice a day until surgery.

## 2016-10-28 NOTE — Procedures (Signed)
PreOperative Dx: Alcoholic cirrhosis, ascites Postoperative Dx: Alcoholic cirrhosis, ascites Procedure:   US guided paracentesis Radiologist:  Thornton Papas Anesthesia:  10 ml of1% lidocaine Specimen:  4.2 L of yellow ascitic fluid EBL:   < 1 ml Complications: None

## 2016-10-28 NOTE — Patient Instructions (Signed)
Sherry Hopkins  10/28/2016     @PREFPERIOPPHARMACY @   Your procedure is scheduled on  11/01/2016   Report to [facility] at  80  A.M.  Call this number if you have problems the morning of surgery:  [phone]   Remember:  Do not eat food or drink liquids after midnight.  Take these medicines the morning of surgery with A SIP OF WATER  Vasotec, levothyroxine, atibvan, prilosec, zofran or compazine, ultram, diovan.   Do not wear jewelry, make-up or nail polish.  Do not wear lotions, powders, or perfumes, or deoderant.  Do not shave 48 hours prior to surgery.  Men may shave face and neck.  Do not bring valuables to the hospital.  [facility] is not responsible for any belongings or valuables.  Contacts, dentures or bridgework may not be worn into surgery.  Leave your suitcase in the car.  After surgery it may be brought to your room.  For patients admitted to the hospital, discharge time will be determined by your treatment team.  Patients discharged the day of surgery will not be allowed to drive home.   Name and phone number of your driver:   family Special instructions:  None  Please read over the following fact sheets that you were given. Anesthesia Post-op Instructions and Care and Recovery After Surgery       Implanted [facility] Guide An implanted port is a type of central line that is placed under the skin. Central lines are used to provide IV access when treatment or nutrition needs to be given through a person's veins. Implanted ports are used for long-term IV access. An implanted port may be placed because:  You need IV medicine that would be irritating to the small veins in your hands or arms.  You need long-term IV medicines, such as antibiotics.  You need IV nutrition for a long period.  You need frequent blood draws for lab tests.  You need dialysis.  Implanted ports are usually placed in the chest area, but they can also be  placed in the upper arm, the abdomen, or the leg. An implanted port has two main parts:  Reservoir. The reservoir is round and will appear as a small, raised area under your skin. The reservoir is the part where a needle is inserted to give medicines or draw blood.  Catheter. The catheter is a thin, flexible tube that extends from the reservoir. The catheter is placed into a large vein. Medicine that is inserted into the reservoir goes into the catheter and then into the vein.  How will I care for my incision site? Do not get the incision site wet. Bathe or shower as directed by your health care provider. How is my port accessed? Special steps must be taken to access the port:  Before the port is accessed, a numbing cream can be placed on the skin. This helps numb the skin over the port site.  Your health care provider uses a sterile technique to access the port. ? Your health care provider must put on a mask and sterile gloves. ? The skin over your port is cleaned carefully with an antiseptic and allowed to dry. ? The port is gently pinched between sterile gloves, and a needle is inserted into the port.  Only "non-coring" port needles should be used to access the port. Once the port is accessed, a blood return should be checked.  This helps ensure that the port is in the vein and is not clogged.  If your port needs to remain accessed for a constant infusion, a clear (transparent) bandage will be placed over the needle site. The bandage and needle will need to be changed every week, or as directed by your health care provider.  Keep the bandage covering the needle clean and dry. Do not get it wet. Follow your health care provider's instructions on how to take a shower or bath while the port is accessed.  If your port does not need to stay accessed, no bandage is needed over the port.  What is flushing? Flushing helps keep the port from getting clogged. Follow your health care provider's  instructions on how and when to flush the port. Ports are usually flushed with saline solution or a medicine called heparin. The need for flushing will depend on how the port is used.  If the port is used for intermittent medicines or blood draws, the port will need to be flushed: ? After medicines have been given. ? After blood has been drawn. ? As part of routine maintenance.  If a constant infusion is running, the port may not need to be flushed.  How long will my port stay implanted? The port can stay in for as long as your health care provider thinks it is needed. When it is time for the port to come out, surgery will be done to remove it. The procedure is similar to the one performed when the port was put in. When should I seek immediate medical care? When you have an implanted port, you should seek immediate medical care if:  You notice a bad smell coming from the incision site.  You have swelling, redness, or drainage at the incision site.  You have more swelling or pain at the port site or the surrounding area.  You have a fever that is not controlled with medicine.  This information is not intended to replace advice given to you by your health care provider. Make sure you discuss any questions you have with your health care provider. Document Released: 03/01/2005 Document Revised: 08/07/2015 Document Reviewed: 11/06/2012 Elsevier Interactive Patient Education  2017 [place] Insertion Implanted port insertion is a procedure to put in a port and catheter. The port is a device with an injectable disk that can be accessed by your health care provider. The port is connected to a vein in the chest or neck by a small flexible tube (catheter). There are different types of ports. The implanted port may be used as a long-term IV access for:  Medicines, such as chemotherapy.  Fluids.  Liquid nutrition, such as total parenteral nutrition (TPN).  Blood  samples.  Having a port means that your health care provider will not need to use the veins in your arms for these procedures. Tell a health care provider about:  Any allergies you have.  All medicines you are taking, especially blood thinners, as well as any vitamins, herbs, eye drops, creams, over-the-counter medicines, and steroids.  Any problems you or family members have had with anesthetic medicines.  Any blood disorders you have.  Any surgeries you have had.  Any medical conditions you have, including diabetes or kidney problems.  Whether you are pregnant or may be pregnant. What are the risks? Generally, this is a safe procedure. However, problems may occur, including:  Allergic reactions to medicines or dyes.  Damage to other structures or  organs.  Infection.  Damage to the blood vessel, bruising, or bleeding at the puncture site.  Blood clot.  Breakdown of the skin over the port.  A collection of air in the chest that can cause one of the lungs to collapse (pneumothorax). This is rare.  What happens before the procedure? Staying hydrated Follow instructions from your health care provider about hydration, which may include:  Up to 2 hours before the procedure - you may continue to drink clear liquids, such as water, clear fruit juice, black coffee, and plain tea.  Eating and drinking restrictions  Follow instructions from your health care provider about eating and drinking, which may include: ? 8 hours before the procedure - stop eating heavy meals or foods such as meat, fried foods, or fatty foods. ? 6 hours before the procedure - stop eating light meals or foods, such as toast or cereal. ? 6 hours before the procedure - stop drinking milk or drinks that contain milk. ? 2 hours before the procedure - stop drinking clear liquids. Medicines  Ask your health care provider about: ? Changing or stopping your regular medicines. This is especially important if  you are taking diabetes medicines or blood thinners. ? Taking medicines such as aspirin and ibuprofen. These medicines can thin your blood. Do not take these medicines before your procedure if your health care provider instructs you not to.  You may be given antibiotic medicine to help prevent infection. General instructions  Plan to have someone take you home from the hospital or clinic.  If you will be going home right after the procedure, plan to have someone with you for 24 hours.  You may have blood tests.  You may be asked to shower with a germ-killing soap. What happens during the procedure?  To lower your risk of infection: ? Your health care team will wash or sanitize their hands. ? Your skin will be washed with soap. ? Hair may be removed from the surgical area.  An IV tube will be inserted into one of your veins.  You will be given one or more of the following: ? A medicine to help you relax (sedative). ? A medicine to numb the area (local anesthetic).  Two small cuts (incisions) will be made to insert the port. ? One incision will be made in your neck to get access to the vein where the catheter will lie. ? The other incision will be made in the upper chest. This is where the port will lie.  The procedure may be done using continuous X-ray (fluoroscopy) or other imaging tools for guidance.  The port and catheter will be placed. There may be a small, raised area where the port is.  The port will be flushed with a salt solution (saline), and blood will be drawn to make sure that it is working correctly.  The incisions will be closed.  Bandages (dressings) may be placed over the incisions. The procedure may vary among health care providers and hospitals. What happens after the procedure?  Your blood pressure, heart rate, breathing rate, and blood oxygen level will be monitored until the medicines you were given have worn off.  Do not drive for 24 hours if you were  given a sedative.  You will be given a manufacturer's information card for the type of port that you have. Keep this with you.  Your port will need to be flushed and checked as told by your health care provider, usually every  few weeks.  A chest X-ray will be done to: ? Check the placement of the port. ? Make sure there is no injury to your lung. Summary  Implanted port insertion is a procedure to put in a port and catheter.  The implanted port is used as a long-term IV access.  The port will need to be flushed and checked as told by your health care provider, usually every few weeks.  Keep your manufacturer's information card with you at all times. This information is not intended to replace advice given to you by your health care provider. Make sure you discuss any questions you have with your health care provider. Document Released: 12/20/2012 Document Revised: 01/21/2016 Document Reviewed: 01/21/2016 Elsevier Interactive Patient Education  2017 [place] Insertion, Care After This sheet gives you information about how to care for yourself after your procedure. Your health care provider may also give you more specific instructions. If you have problems or questions, contact your health care provider. What can I expect after the procedure? After your procedure, it is common to have:  Discomfort at the port insertion site.  Bruising on the skin over the port. This should improve over 3-4 days.  Follow these instructions at home: [facility] care  After your port is placed, you will get a manufacturer's information card. The card has information about your port. Keep this card with you at all times.  Take care of the port as told by your health care provider. Ask your health care provider if you or a family member can get training for taking care of the port at home. A home health care nurse may also take care of the port.  Make sure to remember what type of port  you have. Incision care  Follow instructions from your health care provider about how to take care of your port insertion site. Make sure you: ? Wash your hands with soap and water before you change your bandage (dressing). If soap and water are not available, use hand sanitizer. ? Change your dressing as told by your health care provider. ? Leave stitches (sutures), skin glue, or adhesive strips in place. These skin closures may need to stay in place for 2 weeks or longer. If adhesive strip edges start to loosen and curl up, you may trim the loose edges. Do not remove adhesive strips completely unless your health care provider tells you to do that.  Check your port insertion site every day for signs of infection. Check for: ? More redness, swelling, or pain. ? More fluid or blood. ? Warmth. ? Pus or a bad smell. General instructions  Do not take baths, swim, or use a hot tub until your health care provider approves.  Do not lift anything that is heavier than 10 lb (4.5 kg) for a week, or as told by your health care provider.  Ask your health care provider when it is okay to: ? Return to work or school. ? Resume usual physical activities or sports.  Do not drive for 24 hours if you were given a medicine to help you relax (sedative).  Take over-the-counter and prescription medicines only as told by your health care provider.  Wear a medical alert bracelet in case of an emergency. This will tell any health care providers that you have a port.  Keep all follow-up visits as told by your health care provider. This is important. Contact a health care provider if:  You cannot flush your  port with saline as directed, or you cannot draw blood from the port.  You have a fever or chills.  You have more redness, swelling, or pain around your port insertion site.  You have more fluid or blood coming from your port insertion site.  Your port insertion site feels warm to the touch.  You  have pus or a bad smell coming from the port insertion site. Get help right away if:  You have chest pain or shortness of breath.  You have bleeding from your port that you cannot control. Summary  Take care of the port as told by your health care provider.  Change your dressing as told by your health care provider.  Keep all follow-up visits as told by your health care provider. This information is not intended to replace advice given to you by your health care provider. Make sure you discuss any questions you have with your health care provider. Document Released: 12/20/2012 Document Revised: 01/21/2016 Document Reviewed: 01/21/2016 Elsevier Interactive Patient Education  2017 [place] Anesthesia is a term that refers to techniques, procedures, and medicines that help a person stay safe and comfortable during a medical procedure. Monitored anesthesia care, or sedation, is one type of anesthesia. Your anesthesia specialist may recommend sedation if you will be having a procedure that does not require you to be unconscious, such as:  Cataract surgery.  A dental procedure.  A biopsy.  A colonoscopy.  During the procedure, you may receive a medicine to help you relax (sedative). There are three levels of sedation:  Mild sedation. At this level, you may feel awake and relaxed. You will be able to follow directions.  Moderate sedation. At this level, you will be sleepy. You may not remember the procedure.  Deep sedation. At this level, you will be asleep. You will not remember the procedure.  The more medicine you are given, the deeper your level of sedation will be. Depending on how you respond to the procedure, the anesthesia specialist may change your level of sedation or the type of anesthesia to fit your needs. An anesthesia specialist will monitor you closely during the procedure. Let your health care provider know about:  Any allergies you  have.  All medicines you are taking, including vitamins, herbs, eye drops, creams, and over-the-counter medicines.  Any use of steroids (by mouth or as a cream).  Any problems you or family members have had with sedatives and anesthetic medicines.  Any blood disorders you have.  Any surgeries you have had.  Any medical conditions you have, such as sleep apnea.  Whether you are pregnant or may be pregnant.  Any use of cigarettes, alcohol, or street drugs. What are the risks? Generally, this is a safe procedure. However, problems may occur, including:  Getting too much medicine (oversedation).  Nausea.  Allergic reaction to medicines.  Trouble breathing. If this happens, a breathing tube may be used to help with breathing. It will be removed when you are awake and breathing on your own.  Heart trouble.  Lung trouble.  Before the procedure Staying hydrated Follow instructions from your health care provider about hydration, which may include:  Up to 2 hours before the procedure - you may continue to drink clear liquids, such as water, clear fruit juice, black coffee, and plain tea.  Eating and drinking restrictions Follow instructions from your health care provider about eating and drinking, which may include:  8 hours before the procedure -  stop eating heavy meals or foods such as meat, fried foods, or fatty foods.  6 hours before the procedure - stop eating light meals or foods, such as toast or cereal.  6 hours before the procedure - stop drinking milk or drinks that contain milk.  2 hours before the procedure - stop drinking clear liquids.  Medicines Ask your health care provider about:  Changing or stopping your regular medicines. This is especially important if you are taking diabetes medicines or blood thinners.  Taking medicines such as aspirin and ibuprofen. These medicines can thin your blood. Do not take these medicines before your procedure if your health  care provider instructs you not to.  Tests and exams  You will have a physical exam.  You may have blood tests done to show: ? How well your kidneys and liver are working. ? How well your blood can clot.  General instructions  Plan to have someone take you home from the hospital or clinic.  If you will be going home right after the procedure, plan to have someone with you for 24 hours.  What happens during the procedure?  Your blood pressure, heart rate, breathing, level of pain and overall condition will be monitored.  An IV tube will be inserted into one of your veins.  Your anesthesia specialist will give you medicines as needed to keep you comfortable during the procedure. This may mean changing the level of sedation.  The procedure will be performed. After the procedure  Your blood pressure, heart rate, breathing rate, and blood oxygen level will be monitored until the medicines you were given have worn off.  Do not drive for 24 hours if you received a sedative.  You may: ? Feel sleepy, clumsy, or nauseous. ? Feel forgetful about what happened after the procedure. ? Have a sore throat if you had a breathing tube during the procedure. ? Vomit. This information is not intended to replace advice given to you by your health care provider. Make sure you discuss any questions you have with your health care provider. Document Released: 11/25/2004 Document Revised: 08/08/2015 Document Reviewed: 06/22/2015 Elsevier Interactive Patient Education  2018 [place], Care After These instructions provide you with information about caring for yourself after your procedure. Your health care provider may also give you more specific instructions. Your treatment has been planned according to current medical practices, but problems sometimes occur. Call your health care provider if you have any problems or questions after your procedure. What can I expect after  the procedure? After your procedure, it is common to:  Feel sleepy for several hours.  Feel clumsy and have poor balance for several hours.  Feel forgetful about what happened after the procedure.  Have poor judgment for several hours.  Feel nauseous or vomit.  Have a sore throat if you had a breathing tube during the procedure.  Follow these instructions at home: For at least 24 hours after the procedure:   Do not: ? Participate in activities in which you could fall or become injured. ? Drive. ? Use heavy machinery. ? Drink alcohol. ? Take sleeping pills or medicines that cause drowsiness. ? Make important decisions or sign legal documents. ? Take care of children on your own.  Rest. Eating and drinking  Follow the diet that is recommended by your health care provider.  If you vomit, drink water, juice, or soup when you can drink without vomiting.  Make sure you have little  or no nausea before eating solid foods. General instructions  Have a responsible adult stay with you until you are awake and alert.  Take over-the-counter and prescription medicines only as told by your health care provider.  If you smoke, do not smoke without supervision.  Keep all follow-up visits as told by your health care provider. This is important. Contact a health care provider if:  You keep feeling nauseous or you keep vomiting.  You feel light-headed.  You develop a rash.  You have a fever. Get help right away if:  You have trouble breathing. This information is not intended to replace advice given to you by your health care provider. Make sure you discuss any questions you have with your health care provider. Document Released: 06/22/2015 Document Revised: 10/22/2015 Document Reviewed: 06/22/2015 Elsevier Interactive Patient Education  [employer].

## 2016-10-28 NOTE — Progress Notes (Signed)
Chemotherapy not given today per Mike Craze NP. Will given saline per orders. Follow up as scheduled.

## 2016-10-28 NOTE — Progress Notes (Signed)
Sherry Hopkins; [id number]; 07/01/1940   HPI Patient is a 76 year old black female with peritoneal carcinomatosis who is referred to my care by Dr. Talbert Cage of oncology for Port-A-Cath placement. She is about to start chemotherapy and needs central venous access. She currently has no pain. Past Medical History:  Diagnosis Date  . Diabetes mellitus type 2, diet-controlled (HCC)    diet controlled  . Hypercholesteremia   . Hypertension   . Hypothyroidism   . Iron deficiency anemia 10/12/2016  . Peritoneal carcinomatosis ([place]) 10/11/2016    Past Surgical History:  Procedure Laterality Date  . ABDOMINAL HYSTERECTOMY    . CATARACT EXTRACTION W/PHACO  08/23/2011   Procedure: CATARACT EXTRACTION PHACO AND INTRAOCULAR LENS PLACEMENT (IOC);  Surgeon: Tonny Branch, MD;  Location: [facility];  Service: Ophthalmology;  Laterality: Left;  CDE:15.60  . CATARACT EXTRACTION W/PHACO Right 08/16/2013   Procedure: CATARACT EXTRACTION PHACO AND INTRAOCULAR LENS PLACEMENT (IOC);  Surgeon: Tonny Branch, MD;  Location: [facility];  Service: Ophthalmology;  Laterality: Right;  CDE:8.18    No family history on file.  Current Outpatient Prescriptions on File Prior to Visit  Medication Sig Dispense Refill  . aspirin EC 81 MG tablet Take 81 mg by mouth every morning.    . Calcium Carbonate-Vitamin D (CALTRATE 600+D) 600-400 MG-UNIT per tablet Take 1 tablet by mouth every evening.    Marland Kitchen CARBOPLATIN IV Inject into the vein.    Marland Kitchen enalapril (VASOTEC) 5 MG tablet Take 5 mg by mouth every morning.    . furosemide (LASIX) 40 MG tablet Take 1 tablet (40 mg total) by mouth daily as needed. 30 tablet 0  . levothyroxine (SYNTHROID, LEVOTHROID) 25 MCG tablet Take 25 mcg by mouth daily before breakfast.    . lidocaine-prilocaine (EMLA) cream Apply a quarter size amount to affected area 1 hour prior to coming to chemotherapy. 30 g 2  . LORazepam (ATIVAN) 0.5 MG tablet Take 1 tablet (0.5 mg total) by mouth every 8 (eight) hours. 60 tablet 2   . metFORMIN (GLUCOPHAGE-XR) 500 MG 24 hr tablet Take 500 mg by mouth daily.    . naproxen sodium (ANAPROX) 220 MG tablet Take 220 mg by mouth daily as needed.    Marland Kitchen omeprazole (PRILOSEC) 20 MG capsule Take 1 capsule (20 mg total) by mouth daily. 30 capsule 3  . ondansetron (ZOFRAN ODT) 8 MG disintegrating tablet Take 1 tablet (8 mg total) by mouth every 8 (eight) hours as needed for nausea or vomiting. 30 tablet 2  . PACLitaxel (TAXOL IV) Inject into the vein.    . polyethylene glycol powder (GLYCOLAX/MIRALAX) powder Take 1 capful daily. 255 g 2  . pravastatin (PRAVACHOL) 80 MG tablet Take 80 mg by mouth every evening.    . prochlorperazine (COMPAZINE) 10 MG tablet Take 1 tablet (10 mg total) by mouth every 6 (six) hours as needed for nausea or vomiting. 30 tablet 2  . traMADol (ULTRAM) 50 MG tablet Take 1 tablet (50 mg total) by mouth every 6 (six) hours as needed. 60 tablet 0  . valsartan (DIOVAN) 80 MG tablet Take 80 mg by mouth daily.     No current facility-administered medications on file prior to visit.     No Known Allergies  History  Alcohol Use No    History  Smoking Status  . Never Smoker  Smokeless Tobacco  . Never Used    Review of Systems  Constitutional: Positive for malaise/fatigue.  HENT: Negative.   Eyes: Negative.   Respiratory: Positive  for shortness of breath.   Cardiovascular: Negative.   Gastrointestinal: Positive for abdominal pain, heartburn, nausea and vomiting.  Musculoskeletal: Negative.   Skin: Negative.   Neurological: Positive for weakness.  Psychiatric/Behavioral: Negative.     Objective   Vitals:   10/28/16 1150  BP: 124/73  Pulse: 88  Resp: 18  Temp: 97.8 F (36.6 C)    Physical Exam  Constitutional: She is oriented to person, place, and time and well-developed, well-nourished, and in no distress.  HENT:  Head: Normocephalic and atraumatic.  Cardiovascular: Normal rate and regular rhythm.  Exam reveals no gallop and no friction  rub.   No murmur heard. Pulmonary/Chest: Effort normal and breath sounds normal. No respiratory distress. She has no wheezes. She has no rales.  Neurological: She is alert and oriented to person, place, and time.  Skin: Skin is warm and dry.  Vitals reviewed.  oncology notes reviewed  Assessment  Peritoneal carcinomatosis, need for central venous access Plan   Patient is scheduled for Port-A-Cath insertion on 11/01/2016. The risks and benefits of the procedure including bleeding, infection, and pneumothorax were fully explained to the patient, who gave informed consent. As her potassium is 3.1, she has been ordered K Dur 20 mEq twice a day until surgery.

## 2016-10-28 NOTE — Progress Notes (Signed)
chemotherapy teaching completed.  Consent signed.  Extensive teaching packet given.

## 2016-10-29 NOTE — Progress Notes (Signed)
I interviewed Ms. Steller by phone to update her medication list prior to her procedure. She seemed confused about all of the medications she takes. I recommended she get a pillbox and have her daughter help her fill it. Please counsel the patient on her medications and update her list accordingly.   Thank you.  Romeo Rabon, PharmD, pager [phone]. 10/29/2016,8:54 AM.

## 2016-11-01 ENCOUNTER — Ambulatory Visit (HOSPITAL_COMMUNITY): Payer: Medicare Other

## 2016-11-01 ENCOUNTER — Ambulatory Visit (HOSPITAL_COMMUNITY): Payer: Medicare Other | Admitting: Anesthesiology

## 2016-11-01 ENCOUNTER — Ambulatory Visit (HOSPITAL_COMMUNITY)
Admission: RE | Admit: 2016-11-01 | Discharge: 2016-11-01 | Disposition: A | Discharge status: Home / Self Care | Payer: Medicare Other | Source: Ambulatory Visit | Attending: General Surgery | Admitting: General Surgery

## 2016-11-01 ENCOUNTER — Encounter (HOSPITAL_COMMUNITY)
Admission: RE | Disposition: A | Discharge status: Home / Self Care | Payer: Self-pay | Source: Ambulatory Visit | Attending: General Surgery

## 2016-11-01 ENCOUNTER — Encounter (HOSPITAL_COMMUNITY): Payer: Self-pay | Admitting: Anesthesiology

## 2016-11-01 DIAGNOSIS — Z7984 Long term (current) use of oral hypoglycemic drugs: Secondary | ICD-10-CM | POA: Diagnosis not present

## 2016-11-01 DIAGNOSIS — E039 Hypothyroidism, unspecified: Secondary | ICD-10-CM | POA: Diagnosis not present

## 2016-11-01 DIAGNOSIS — E7800 Pure hypercholesterolemia, unspecified: Secondary | ICD-10-CM | POA: Insufficient documentation

## 2016-11-01 DIAGNOSIS — E119 Type 2 diabetes mellitus without complications: Secondary | ICD-10-CM | POA: Insufficient documentation

## 2016-11-01 DIAGNOSIS — C801 Malignant (primary) neoplasm, unspecified: Secondary | ICD-10-CM | POA: Diagnosis not present

## 2016-11-01 DIAGNOSIS — C786 Secondary malignant neoplasm of retroperitoneum and peritoneum: Secondary | ICD-10-CM

## 2016-11-01 DIAGNOSIS — Z79899 Other long term (current) drug therapy: Secondary | ICD-10-CM | POA: Diagnosis not present

## 2016-11-01 DIAGNOSIS — I10 Essential (primary) hypertension: Secondary | ICD-10-CM | POA: Insufficient documentation

## 2016-11-01 DIAGNOSIS — Z95828 Presence of other vascular implants and grafts: Secondary | ICD-10-CM

## 2016-11-01 DIAGNOSIS — Z7982 Long term (current) use of aspirin: Secondary | ICD-10-CM | POA: Insufficient documentation

## 2016-11-01 HISTORY — PX: PORTACATH PLACEMENT: SHX2246

## 2016-11-01 LAB — GLUCOSE, CAPILLARY
GLUCOSE-CAPILLARY: 108 mg/dL — AB (ref 65–99)
GLUCOSE-CAPILLARY: 106 mg/dL — AB (ref 65–99)

## 2016-11-01 SURGERY — INSERTION, TUNNELED CENTRAL VENOUS DEVICE, WITH PORT
Anesthesia: Monitor Anesthesia Care | Site: Chest | Laterality: Left

## 2016-11-01 MED ORDER — PROPOFOL 10 MG/ML IV BOLUS
INTRAVENOUS | Status: DC | PRN
Start: 2016-11-01 — End: 2016-11-01
  Administered 2016-11-01 (×2): 10 mg via INTRAVENOUS

## 2016-11-01 MED ORDER — CHLORHEXIDINE GLUCONATE CLOTH 2 % EX PADS: 6.0000 | MEDICATED_PAD | Freq: Once | CUTANEOUS | Status: DC

## 2016-11-01 MED ORDER — SODIUM CHLORIDE 0.9 % IV SOLN
INTRAVENOUS | Status: AC | PRN
  Administered 2016-11-01: 3 mL

## 2016-11-01 MED ORDER — CEFAZOLIN SODIUM-DEXTROSE 2-4 GM/100ML-% IV SOLN
INTRAVENOUS | Status: AC
  Filled 2016-11-01: qty 100

## 2016-11-01 MED ORDER — LIDOCAINE HCL (PF) 1 % IJ SOLN
INTRAMUSCULAR | Status: AC
  Filled 2016-11-01: qty 30

## 2016-11-01 MED ORDER — FENTANYL CITRATE (PF) 100 MCG/2ML IJ SOLN
INTRAMUSCULAR | Status: AC
  Filled 2016-11-01: qty 2

## 2016-11-01 MED ORDER — KETOROLAC TROMETHAMINE 30 MG/ML IJ SOLN
30.0000 mg | Freq: Once | INTRAMUSCULAR | Status: AC
  Administered 2016-11-01: 30 mg via INTRAVENOUS
  Filled 2016-11-01: qty 1

## 2016-11-01 MED ORDER — PROPOFOL 500 MG/50ML IV EMUL
INTRAVENOUS | Status: DC | PRN
  Administered 2016-11-01: 25 ug/kg/min via INTRAVENOUS

## 2016-11-01 MED ORDER — LIDOCAINE HCL (PF) 1 % IJ SOLN
INTRAMUSCULAR | Status: AC
  Filled 2016-11-01: qty 5

## 2016-11-01 MED ORDER — HEPARIN SOD (PORK) LOCK FLUSH 100 UNIT/ML IV SOLN
INTRAVENOUS | Status: AC
  Filled 2016-11-01: qty 5

## 2016-11-01 MED ORDER — MIDAZOLAM HCL 5 MG/5ML IJ SOLN
INTRAMUSCULAR | Status: DC | PRN
  Administered 2016-11-01: 1 mg via INTRAVENOUS
  Administered 2016-11-01 (×2): 0.5 mg via INTRAVENOUS

## 2016-11-01 MED ORDER — LIDOCAINE HCL (PF) 1 % IJ SOLN
INTRAMUSCULAR | Status: DC | PRN
  Administered 2016-11-01: 12 mL

## 2016-11-01 MED ORDER — MIDAZOLAM HCL 2 MG/2ML IJ SOLN
INTRAMUSCULAR | Status: AC
  Filled 2016-11-01: qty 2

## 2016-11-01 MED ORDER — LACTATED RINGERS IV SOLN
INTRAVENOUS | Status: DC
  Administered 2016-11-01: 10:00:00 via INTRAVENOUS

## 2016-11-01 MED ORDER — HEPARIN SOD (PORK) LOCK FLUSH 100 UNIT/ML IV SOLN
INTRAVENOUS | Status: DC | PRN
  Administered 2016-11-01: 500 [IU]

## 2016-11-01 MED ORDER — CEFAZOLIN SODIUM-DEXTROSE 2-4 GM/100ML-% IV SOLN
2.0000 g | INTRAVENOUS | Status: AC
Start: 2016-11-01 — End: 2016-11-01
  Administered 2016-11-01: 2 g via INTRAVENOUS

## 2016-11-01 SURGICAL SUPPLY — 29 items
BAG DECANTER FOR FLEXI CONT (MISCELLANEOUS) ×3 IMPLANT
BAG HAMPER (MISCELLANEOUS) ×3 IMPLANT
CHLORAPREP W/TINT 10.5 ML (MISCELLANEOUS) ×3 IMPLANT
CLOTH BEACON ORANGE TIMEOUT ST (SAFETY) ×3 IMPLANT
COVER LIGHT HANDLE STERIS (MISCELLANEOUS) ×6 IMPLANT
DECANTER SPIKE VIAL GLASS SM (MISCELLANEOUS) ×3 IMPLANT
DERMABOND ADVANCED (GAUZE/BANDAGES/DRESSINGS) ×2
DERMABOND ADVANCED .7 DNX12 (GAUZE/BANDAGES/DRESSINGS) ×1 IMPLANT
DRAPE C-ARM FOLDED MOBILE STRL (DRAPES) ×3 IMPLANT
ELECT REM PT RETURN 9FT ADLT (ELECTROSURGICAL) ×3
ELECTRODE REM PT RTRN 9FT ADLT (ELECTROSURGICAL) ×1 IMPLANT
GLOVE BIOGEL PI IND STRL 7.0 (GLOVE) ×1 IMPLANT
GLOVE BIOGEL PI INDICATOR 7.0 (GLOVE) ×2
GLOVE SURG SS PI 7.5 STRL IVOR (GLOVE) ×3 IMPLANT
GOWN STRL REUS W/TWL LRG LVL3 (GOWN DISPOSABLE) ×6 IMPLANT
IV NS 500ML (IV SOLUTION) ×2
IV NS 500ML BAXH (IV SOLUTION) ×1 IMPLANT
KIT PORT POWER 8FR ISP MRI (Port) ×3 IMPLANT
KIT ROOM TURNOVER APOR (KITS) ×3 IMPLANT
MANIFOLD NEPTUNE II (INSTRUMENTS) ×3 IMPLANT
NEEDLE HYPO 25X1 1.5 SAFETY (NEEDLE) ×3 IMPLANT
PACK MINOR (CUSTOM PROCEDURE TRAY) ×3 IMPLANT
PAD ARMBOARD 7.5X6 YLW CONV (MISCELLANEOUS) ×3 IMPLANT
SET BASIN LINEN APH (SET/KITS/TRAYS/PACK) ×3 IMPLANT
SUT VIC AB 3-0 SH 27 (SUTURE) ×2
SUT VIC AB 3-0 SH 27X BRD (SUTURE) ×1 IMPLANT
SUT VIC AB 4-0 PS2 27 (SUTURE) ×3 IMPLANT
SYR 20CC LL (SYRINGE) ×3 IMPLANT
SYR CONTROL 10ML LL (SYRINGE) ×3 IMPLANT

## 2016-11-01 NOTE — Interval H&P Note (Signed)
History and Physical Interval Note:  11/01/2016 10:49 AM  Sherry Hopkins  has presented today for surgery, with the diagnosis of ovarian cancer  The various methods of treatment have been discussed with the patient and family. After consideration of risks, benefits and other options for treatment, the patient has consented to  Procedure(s): INSERTION PORT-A-CATH (N/A) as a surgical intervention .  The patient's history has been reviewed, patient examined, no change in status, stable for surgery.  I have reviewed the patient's chart and labs.  Questions were answered to the patient's satisfaction.     Aviva Signs

## 2016-11-01 NOTE — Op Note (Signed)
Patient:  Sherry Hopkins  DOB:  [birth year]  MRN:  [id number]   Preop Diagnosis:  Peritoneal carcinomatosis   Postop Diagnosis:  Same  Procedure:  Port-A-Cath insertion  Surgeon:  Aviva Signs, M.D.  Anes:  Mac  Indications:  Patient is a 76 year old black female who was undergoing chemotherapy for peritoneal carcinomatosis. She needs central venous access. The risks and benefits of the procedure including bleeding, infection, and the possibility of a pneumothorax were fully explained to the patient, who gave informed consent.  Procedure note:  The patient was placed in the Trendelenburg position after monitored anesthesia care was given. The left upper chest was prepped and draped using the usual sterile technique with DuraPrep. Surgical site confirmation was performed. One percent Xylocaine was used for local anesthesia.  An incision was made below the left clavicle. A subcutaneous pocket was formed. The needles advanced into the left subclavian vein using the Seldinger technique without difficulty. A guidewire was then advanced into the right atrium under fluoroscopic guidance. An introducer and peel-away sheath were placed over the guidewire. The catheter was then inserted through the peel-away sheath and the peel-away sheath was removed. The catheter was then attached to the port and the port placed in subcutaneous pocket. Adequate positioning was confirmed by fluoroscopy. Good backflow of blood was noted on aspiration of the port. The port was flushed with heparin flush. Subcutaneous layer was reapproximated using a 3-0 Vicryl interrupted suture. The skin was closed using a 4-0 Vicryl subcuticular suture. Dermabond was applied.  All tape and needle counts were correct at the end of the procedure. The patient was transferred to PACU in stable condition. A chest x-ray will be performed at that time.  Complications:  None  EBL:  Minimal  Specimen:  None

## 2016-11-01 NOTE — Anesthesia Postprocedure Evaluation (Signed)
Anesthesia Post Note  Patient: Sherry Hopkins  Procedure(s) Performed: Procedure(s) (LRB): INSERTION PORT-A-CATH LEFT SUBCLAVIAN (Left)  Patient location during evaluation: PACU Anesthesia Type: MAC Level of consciousness: awake and alert and oriented Pain management: pain level controlled Vital Signs Assessment: post-procedure vital signs reviewed and stable Respiratory status: spontaneous breathing Cardiovascular status: stable Postop Assessment: no signs of nausea or vomiting Anesthetic complications: no     Last Vitals:  Vitals:   11/01/16 1015 11/01/16 1141  BP: 118/70 116/71  Pulse: 86   Resp: 18 12  Temp: 36.7 C 36.4 C  SpO2: 98% 93%    Last Pain:  Vitals:   11/01/16 1015  TempSrc: Oral                 Ridley Dileo

## 2016-11-01 NOTE — Anesthesia Preprocedure Evaluation (Signed)
Anesthesia Evaluation  Patient identified by MRN, date of birth, ID band Patient awake    Airway Mallampati: I  TM Distance: >3 FB Neck ROM: Full    Dental  (+) Teeth Intact   Pulmonary    Pulmonary exam normal        Cardiovascular hypertension,  Rhythm:Regular Rate:Normal     Neuro/Psych    GI/Hepatic Peritoneal carcinomatosis, ascites   Endo/Other  diabetesHypothyroidism   Renal/GU      Musculoskeletal   Abdominal   Peds  Hematology  (+) anemia ,   Anesthesia Other Findings   Reproductive/Obstetrics                             Anesthesia Physical Anesthesia Plan  ASA: IV  Anesthesia Plan: MAC   Post-op Pain Management:    Induction: Intravenous  PONV Risk Score and Plan:   Airway Management Planned: Mask and Natural Airway  Additional Equipment:   Intra-op Plan:   Post-operative Plan:   Informed Consent: I have reviewed the patients History and Physical, chart, labs and discussed the procedure including the risks, benefits and alternatives for the proposed anesthesia with the patient or authorized representative who has indicated his/her understanding and acceptance.     Plan Discussed with: CRNA  Anesthesia Plan Comments:         Anesthesia Quick Evaluation

## 2016-11-01 NOTE — Transfer of Care (Signed)
Immediate Anesthesia Transfer of Care Note  Patient: Sherry Hopkins  Procedure(s) Performed: Procedure(s): INSERTION PORT-A-CATH LEFT SUBCLAVIAN (Left)  Patient Location: PACU  Anesthesia Type:MAC  Level of Consciousness: awake, alert  and oriented  Airway & Oxygen Therapy: Patient Spontanous Breathing  Post-op Assessment: Report given to RN  Post vital signs: Reviewed and stable  Last Vitals:  Vitals:   11/01/16 1015  BP: 118/70  Pulse: 86  Resp: 18  Temp: 36.7 C  SpO2: 98%    Last Pain:  Vitals:   11/01/16 1015  TempSrc: Oral      Patients Stated Pain Goal: 5 (37/16/96 7893)  Complications: No apparent anesthesia complications

## 2016-11-01 NOTE — Discharge Instructions (Signed)
Implanted Port Home Guide °An implanted port is a type of central line that is placed under the skin. Central lines are used to provide IV access when treatment or nutrition needs to be given through a person’s veins. Implanted ports are used for long-term IV access. An implanted port may be placed because: °· You need IV medicine that would be irritating to the small veins in your hands or arms. °· You need long-term IV medicines, such as antibiotics. °· You need IV nutrition for a long period. °· You need frequent blood draws for lab tests. °· You need dialysis. ° °Implanted ports are usually placed in the chest area, but they can also be placed in the upper arm, the abdomen, or the leg. An implanted port has two main parts: °· Reservoir. The reservoir is round and will appear as a small, raised area under your skin. The reservoir is the part where a needle is inserted to give medicines or draw blood. °· Catheter. The catheter is a thin, flexible tube that extends from the reservoir. The catheter is placed into a large vein. Medicine that is inserted into the reservoir goes into the catheter and then into the vein. ° °How will I care for my incision site? °Do not get the incision site wet. Bathe or shower as directed by your health care provider. °How is my port accessed? °Special steps must be taken to access the port: °· Before the port is accessed, a numbing cream can be placed on the skin. This helps numb the skin over the port site. °· Your health care provider uses a sterile technique to access the port. °? Your health care provider must put on a mask and sterile gloves. °? The skin over your port is cleaned carefully with an antiseptic and allowed to dry. °? The port is gently pinched between sterile gloves, and a needle is inserted into the port. °· Only "non-coring" port needles should be used to access the port. Once the port is accessed, a blood return should be checked. This helps ensure that the port  is in the vein and is not clogged. °· If your port needs to remain accessed for a constant infusion, a clear (transparent) bandage will be placed over the needle site. The bandage and needle will need to be changed every week, or as directed by your health care provider. °· Keep the bandage covering the needle clean and dry. Do not get it wet. Follow your health care provider’s instructions on how to take a shower or bath while the port is accessed. °· If your port does not need to stay accessed, no bandage is needed over the port. ° °What is flushing? °Flushing helps keep the port from getting clogged. Follow your health care provider’s instructions on how and when to flush the port. Ports are usually flushed with saline solution or a medicine called heparin. The need for flushing will depend on how the port is used. °· If the port is used for intermittent medicines or blood draws, the port will need to be flushed: °? After medicines have been given. °? After blood has been drawn. °? As part of routine maintenance. °· If a constant infusion is running, the port may not need to be flushed. ° °How long will my port stay implanted? °The port can stay in for as long as your health care provider thinks it is needed. When it is time for the port to come out, surgery will be   done to remove it. The procedure is similar to the one performed when the port was put in. °When should I seek immediate medical care? °When you have an implanted port, you should seek immediate medical care if: °· You notice a bad smell coming from the incision site. °· You have swelling, redness, or drainage at the incision site. °· You have more swelling or pain at the port site or the surrounding area. °· You have a fever that is not controlled with medicine. ° °This information is not intended to replace advice given to you by your health care provider. Make sure you discuss any questions you have with your health care provider. °Document  Released: 03/01/2005 Document Revised: 08/07/2015 Document Reviewed: 11/06/2012 °Elsevier Interactive Patient Education © 2017 [employer] °Implanted Port Insertion, Care After °This sheet gives you information about how to care for yourself after your procedure. Your health care provider may also give you more specific instructions. If you have problems or questions, contact your health care provider. °What can I expect after the procedure? °After your procedure, it is common to have: °· Discomfort at the port insertion site. °· Bruising on the skin over the port. This should improve over 3-4 days. ° °Follow these instructions at home: °Port care °· After your port is placed, you will get a manufacturer's information card. The card has information about your port. Keep this card with you at all times. °· Take care of the port as told by your health care provider. Ask your health care provider if you or a family member can get training for taking care of the port at home. A home health care nurse may also take care of the port. °· Make sure to remember what type of port you have. °Incision care °· Follow instructions from your health care provider about how to take care of your port insertion site. Make sure you: °? Wash your hands with soap and water before you change your bandage (dressing). If soap and water are not available, use hand sanitizer. °? Change your dressing as told by your health care provider. °? Leave stitches (sutures), skin glue, or adhesive strips in place. These skin closures may need to stay in place for 2 weeks or longer. If adhesive strip edges start to loosen and curl up, you may trim the loose edges. Do not remove adhesive strips completely unless your health care provider tells you to do that. °· Check your port insertion site every day for signs of infection. Check for: °? More redness, swelling, or pain. °? More fluid or blood. °? Warmth. °? Pus or a bad smell. °General  instructions °· Do not take baths, swim, or use a hot tub until your health care provider approves. °· Do not lift anything that is heavier than 10 lb (4.5 kg) for a week, or as told by your health care provider. °· Ask your health care provider when it is okay to: °? Return to work or school. °? Resume usual physical activities or sports. °· Do not drive for 24 hours if you were given a medicine to help you relax (sedative). °· Take over-the-counter and prescription medicines only as told by your health care provider. °· Wear a medical alert bracelet in case of an emergency. This will tell any health care providers that you have a port. °· Keep all follow-up visits as told by your health care provider. This is important. °Contact a health care provider if: °· You cannot   flush your port with saline as directed, or you cannot draw blood from the port. °· You have a fever or chills. °· You have more redness, swelling, or pain around your port insertion site. °· You have more fluid or blood coming from your port insertion site. °· Your port insertion site feels warm to the touch. °· You have pus or a bad smell coming from the port insertion site. °Get help right away if: °· You have chest pain or shortness of breath. °· You have bleeding from your port that you cannot control. °Summary °· Take care of the port as told by your health care provider. °· Change your dressing as told by your health care provider. °· Keep all follow-up visits as told by your health care provider. °This information is not intended to replace advice given to you by your health care provider. Make sure you discuss any questions you have with your health care provider. °Document Released: 12/20/2012 Document Revised: 01/21/2016 Document Reviewed: 01/21/2016 °Elsevier Interactive Patient Education © 2017 [employer] ° °

## 2016-11-03 ENCOUNTER — Encounter (HOSPITAL_COMMUNITY): Payer: Self-pay | Admitting: General Surgery

## 2016-11-03 ENCOUNTER — Encounter (HOSPITAL_BASED_OUTPATIENT_CLINIC_OR_DEPARTMENT_OTHER): Payer: Medicare Other

## 2016-11-03 VITALS — BP 118/66 | HR 76 | Temp 99.2°F | Resp 16 | Wt 129.0 lb

## 2016-11-03 DIAGNOSIS — C801 Malignant (primary) neoplasm, unspecified: Secondary | ICD-10-CM | POA: Diagnosis not present

## 2016-11-03 DIAGNOSIS — Z5111 Encounter for antineoplastic chemotherapy: Secondary | ICD-10-CM | POA: Diagnosis not present

## 2016-11-03 DIAGNOSIS — E876 Hypokalemia: Secondary | ICD-10-CM | POA: Diagnosis not present

## 2016-11-03 DIAGNOSIS — E039 Hypothyroidism, unspecified: Secondary | ICD-10-CM | POA: Diagnosis not present

## 2016-11-03 DIAGNOSIS — Z7982 Long term (current) use of aspirin: Secondary | ICD-10-CM | POA: Diagnosis not present

## 2016-11-03 DIAGNOSIS — R300 Dysuria: Secondary | ICD-10-CM | POA: Diagnosis not present

## 2016-11-03 DIAGNOSIS — Z7984 Long term (current) use of oral hypoglycemic drugs: Secondary | ICD-10-CM | POA: Diagnosis not present

## 2016-11-03 DIAGNOSIS — Z79899 Other long term (current) drug therapy: Secondary | ICD-10-CM | POA: Diagnosis not present

## 2016-11-03 DIAGNOSIS — E119 Type 2 diabetes mellitus without complications: Secondary | ICD-10-CM | POA: Diagnosis not present

## 2016-11-03 DIAGNOSIS — E7800 Pure hypercholesterolemia, unspecified: Secondary | ICD-10-CM | POA: Diagnosis not present

## 2016-11-03 DIAGNOSIS — K219 Gastro-esophageal reflux disease without esophagitis: Secondary | ICD-10-CM | POA: Diagnosis not present

## 2016-11-03 DIAGNOSIS — D649 Anemia, unspecified: Secondary | ICD-10-CM | POA: Diagnosis not present

## 2016-11-03 DIAGNOSIS — C786 Secondary malignant neoplasm of retroperitoneum and peritoneum: Secondary | ICD-10-CM | POA: Diagnosis not present

## 2016-11-03 DIAGNOSIS — I10 Essential (primary) hypertension: Secondary | ICD-10-CM | POA: Diagnosis not present

## 2016-11-03 LAB — CBC WITH DIFFERENTIAL/PLATELET
Basophils Absolute: 0 10*3/uL (ref 0.0–0.1)
Basophils Relative: 0 %
EOS ABS: 0.1 10*3/uL (ref 0.0–0.7)
EOS PCT: 2 %
HCT: 29.7 % — ABNORMAL LOW (ref 36.0–46.0)
Hemoglobin: 9.3 g/dL — ABNORMAL LOW (ref 12.0–15.0)
LYMPHS ABS: 1.2 10*3/uL (ref 0.7–4.0)
Lymphocytes Relative: 16 %
MCH: 26.1 pg (ref 26.0–34.0)
MCHC: 31.3 g/dL (ref 30.0–36.0)
MCV: 83.2 fL (ref 78.0–100.0)
Monocytes Absolute: 0.9 10*3/uL (ref 0.1–1.0)
Monocytes Relative: 11 %
Neutro Abs: 5.3 10*3/uL (ref 1.7–7.7)
Neutrophils Relative %: 71 %
Platelets: 325 10*3/uL (ref 150–400)
RBC: 3.57 MIL/uL — AB (ref 3.87–5.11)
RDW: 16.7 % — ABNORMAL HIGH (ref 11.5–15.5)
WBC: 7.6 10*3/uL (ref 4.0–10.5)

## 2016-11-03 LAB — COMPREHENSIVE METABOLIC PANEL
ALT: 18 U/L (ref 14–54)
ANION GAP: 9 (ref 5–15)
AST: 24 U/L (ref 15–41)
Albumin: 3 g/dL — ABNORMAL LOW (ref 3.5–5.0)
Alkaline Phosphatase: 67 U/L (ref 38–126)
BUN: 8 mg/dL (ref 6–20)
CO2: 34 mmol/L — AB (ref 22–32)
CREATININE: 0.59 mg/dL (ref 0.44–1.00)
Calcium: 8.8 mg/dL — ABNORMAL LOW (ref 8.9–10.3)
Chloride: 95 mmol/L — ABNORMAL LOW (ref 101–111)
GFR calc non Af Amer: >60 mL/min (ref >60)
Glucose, Bld: 94 mg/dL (ref 65–99)
Potassium: 3.2 mmol/L — ABNORMAL LOW (ref 3.5–5.1)
SODIUM: 138 mmol/L (ref 135–145)
Total Bilirubin: 0.7 mg/dL (ref 0.3–1.2)
Total Protein: 7.3 g/dL (ref 6.5–8.1)

## 2016-11-03 MED ORDER — SODIUM CHLORIDE 0.9 % IV SOLN
353.0000 mg | Freq: Once | INTRAVENOUS | Status: AC
  Administered 2016-11-03: 350 mg via INTRAVENOUS
  Filled 2016-11-03: qty 35

## 2016-11-03 MED ORDER — POTASSIUM CHLORIDE 10 MEQ/100ML IV SOLN
10.0000 meq | INTRAVENOUS | Status: AC
  Administered 2016-11-03 (×4): 10 meq via INTRAVENOUS
  Filled 2016-11-03 (×4): qty 100

## 2016-11-03 MED ORDER — POTASSIUM CHLORIDE CRYS ER 20 MEQ PO TBCR
40.0000 meq | EXTENDED_RELEASE_TABLET | Freq: Once | ORAL | Status: AC
  Administered 2016-11-03: 40 meq via ORAL
  Filled 2016-11-03: qty 2

## 2016-11-03 MED ORDER — FAMOTIDINE IN NACL 20-0.9 MG/50ML-% IV SOLN
20.0000 mg | Freq: Once | INTRAVENOUS | Status: AC
  Administered 2016-11-03: 20 mg via INTRAVENOUS
  Filled 2016-11-03: qty 50

## 2016-11-03 MED ORDER — SODIUM CHLORIDE 0.9 % IV SOLN
20.0000 mg | Freq: Once | INTRAVENOUS | Status: AC
  Administered 2016-11-03: 20 mg via INTRAVENOUS
  Filled 2016-11-03: qty 2

## 2016-11-03 MED ORDER — DIPHENHYDRAMINE HCL 50 MG/ML IJ SOLN
50.0000 mg | Freq: Once | INTRAMUSCULAR | Status: AC
  Administered 2016-11-03: 50 mg via INTRAVENOUS
  Filled 2016-11-03: qty 1

## 2016-11-03 MED ORDER — SODIUM CHLORIDE 0.9 % IV SOLN
Freq: Once | INTRAVENOUS | Status: AC
  Administered 2016-11-03: 500 mL via INTRAVENOUS

## 2016-11-03 MED ORDER — PALONOSETRON HCL INJECTION 0.25 MG/5ML
0.2500 mg | Freq: Once | INTRAVENOUS | Status: AC
  Administered 2016-11-03: 0.25 mg via INTRAVENOUS
  Filled 2016-11-03: qty 5

## 2016-11-03 MED ORDER — SODIUM CHLORIDE 0.9% FLUSH
10.0000 mL | INTRAVENOUS | Status: DC | PRN
  Administered 2016-11-03: 10 mL
  Filled 2016-11-03: qty 10

## 2016-11-03 MED ORDER — HEPARIN SOD (PORK) LOCK FLUSH 100 UNIT/ML IV SOLN
500.0000 [IU] | Freq: Once | INTRAVENOUS | Status: AC | PRN
  Administered 2016-11-03: 500 [IU]

## 2016-11-03 MED ORDER — DEXTROSE 5 % IV SOLN
175.0000 mg/m2 | Freq: Once | INTRAVENOUS | Status: AC
  Administered 2016-11-03: 288 mg via INTRAVENOUS
  Filled 2016-11-03: qty 48

## 2016-11-03 NOTE — Progress Notes (Signed)
Reviewed labs and patients complaint of SOB x 1 yesterday with Mike Craze, NP.  Reviewed chest xray report after port placement on Monday.  Orders received for Potassium 40 meq PO and runs of potassium x 4 before treatment today.    Micromedex checked with all pre medications for compatibility with no contraindications noted.    Patient tolerated chemotherapy well.  No complaints voiced during treatment.  Port site clean and dry with no bruising noted.  Instructed the patient to call for any fevers, burning with urination, SOB, or any other problems with understanding verbalized.  VSS with discharge.  Left in wheelchair with family at side.  No complaints voiced with discharge.

## 2016-11-03 NOTE — Patient Instructions (Signed)
[facility] Discharge Instructions for Patients Receiving Chemotherapy  Today you received the following chemotherapy agents Taxol and Carboplatin.  Keep scheduled appointments and call for any concerns.   To help prevent nausea and vomiting after your treatment, we encourage you to take your nausea medication.     If you develop nausea and vomiting that is not controlled by your nausea medication, call the clinic.   BELOW ARE SYMPTOMS THAT SHOULD BE REPORTED IMMEDIATELY:  *FEVER GREATER THAN 100.5 F  *CHILLS WITH OR WITHOUT FEVER  NAUSEA AND VOMITING THAT IS NOT CONTROLLED WITH YOUR NAUSEA MEDICATION  *UNUSUAL SHORTNESS OF BREATH  *UNUSUAL BRUISING OR BLEEDING  TENDERNESS IN MOUTH AND THROAT WITH OR WITHOUT PRESENCE OF ULCERS  *URINARY PROBLEMS  *BOWEL PROBLEMS  UNUSUAL RASH Items with * indicate a potential emergency and should be followed up as soon as possible.  Feel free to call the clinic you have any questions or concerns. The clinic phone number is [phone].  Please show the [place] [facility] at check-in to the Emergency Department and triage nurse.

## 2016-11-04 ENCOUNTER — Telehealth (HOSPITAL_COMMUNITY): Payer: Self-pay

## 2016-11-04 ENCOUNTER — Ambulatory Visit (HOSPITAL_COMMUNITY): Payer: Medicare Other

## 2016-11-04 NOTE — Telephone Encounter (Signed)
See telephone encounter note.

## 2016-11-05 ENCOUNTER — Telehealth (HOSPITAL_COMMUNITY): Payer: Self-pay

## 2016-11-05 NOTE — Telephone Encounter (Signed)
See telephone encounter note.

## 2016-11-06 ENCOUNTER — Encounter (HOSPITAL_COMMUNITY): Payer: Self-pay | Admitting: Emergency Medicine

## 2016-11-06 ENCOUNTER — Emergency Department (HOSPITAL_COMMUNITY)
Admission: EM | Admit: 2016-11-06 | Discharge: 2016-11-06 | Disposition: A | Discharge status: Home / Self Care | Payer: Medicare Other | Attending: Emergency Medicine | Admitting: Emergency Medicine

## 2016-11-06 DIAGNOSIS — Z7984 Long term (current) use of oral hypoglycemic drugs: Secondary | ICD-10-CM | POA: Insufficient documentation

## 2016-11-06 DIAGNOSIS — Z79899 Other long term (current) drug therapy: Secondary | ICD-10-CM | POA: Diagnosis not present

## 2016-11-06 DIAGNOSIS — C482 Malignant neoplasm of peritoneum, unspecified: Secondary | ICD-10-CM | POA: Insufficient documentation

## 2016-11-06 DIAGNOSIS — I10 Essential (primary) hypertension: Secondary | ICD-10-CM | POA: Diagnosis not present

## 2016-11-06 DIAGNOSIS — E119 Type 2 diabetes mellitus without complications: Secondary | ICD-10-CM | POA: Diagnosis not present

## 2016-11-06 DIAGNOSIS — Z7982 Long term (current) use of aspirin: Secondary | ICD-10-CM | POA: Diagnosis not present

## 2016-11-06 DIAGNOSIS — E039 Hypothyroidism, unspecified: Secondary | ICD-10-CM | POA: Insufficient documentation

## 2016-11-06 DIAGNOSIS — R1110 Vomiting, unspecified: Secondary | ICD-10-CM | POA: Insufficient documentation

## 2016-11-06 MED ORDER — ONDANSETRON 4 MG PO TBDP: ORAL_TABLET | ORAL | 0 refills | Status: DC

## 2016-11-06 MED ORDER — ONDANSETRON HCL 4 MG/2ML IJ SOLN
4.0000 mg | Freq: Once | INTRAMUSCULAR | Status: AC
  Administered 2016-11-06: 4 mg via INTRAVENOUS
  Filled 2016-11-06: qty 2

## 2016-11-06 MED ORDER — SODIUM CHLORIDE 0.9 % IV BOLUS (SEPSIS)
500.0000 mL | Freq: Once | INTRAVENOUS | Status: AC
  Administered 2016-11-06: 500 mL via INTRAVENOUS

## 2016-11-06 NOTE — Discharge Instructions (Signed)
Follow up with your md this week for recheck  °

## 2016-11-06 NOTE — ED Notes (Signed)
Pt reports feeling better

## 2016-11-06 NOTE — ED Notes (Signed)
Delay in discharge after discharge instructions reviewed, prescription reviewed and amily reports daughter is "almost here" - Daughter asked this RN  To tell her mother that N/V were side effects of her chemo-  Pt encouraged to speak with [place] M=NP on [place], when she has an appt to speak of meds, side effects and answer any questions Dr Roderic Palau informed and asked to speak with pt and family

## 2016-11-06 NOTE — ED Notes (Signed)
Dr Z in to assess 

## 2016-11-06 NOTE — ED Notes (Signed)
Pos offered and accepted by pt and family

## 2016-11-06 NOTE — ED Notes (Signed)
Dr Roderic Palau in to discuss with family

## 2016-11-06 NOTE — ED Notes (Signed)
Pt has an implanted port that was last accessed on Monday.

## 2016-11-06 NOTE — ED Notes (Signed)
Awaiting recheck and dispo

## 2016-11-06 NOTE — ED Provider Notes (Signed)
[facility] DEPT Provider Note   CSN: [id number] Arrival date & time: 11/06/16  1019     History   Chief Complaint Chief Complaint  Patient presents with  . Emesis    HPI Sherry Hopkins is a 76 y.o. female.  Patient had chemotherapy done 3 days ago She vomited once yesterday and once today. She no longer feels nauseated.   The history is provided by the patient.  Emesis   This is a new problem. The current episode started 12 to 24 hours ago. Episode frequency: 2 episodes of vomiting in 2 days. The problem has been resolved. The emesis has an appearance of stomach contents. There has been no fever. Pertinent negatives include no abdominal pain, no cough, no diarrhea and no headaches. Risk factors: chemotherapy.    Past Medical History:  Diagnosis Date  . Diabetes mellitus type 2, diet-controlled (HCC)    diet controlled  . Hypercholesteremia   . Hypertension   . Hypothyroidism   . Iron deficiency anemia 10/12/2016  . 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 Severe malnutrition ([place]) 10/20/2016  . Peripheral edema 10/14/2016  . Iron deficiency anemia 10/12/2016  . 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 Burning with urination 10/11/2016  . Ascites 10/08/2016  . Nausea with vomiting 10/08/2016    Past Surgical History:  Procedure Laterality Date  . ABDOMINAL HYSTERECTOMY    . CATARACT EXTRACTION W/PHACO  08/23/2011   Procedure: CATARACT EXTRACTION PHACO AND INTRAOCULAR LENS PLACEMENT (IOC);  Surgeon: Tonny Branch, MD;  Location: [facility];  Service: Ophthalmology;  Laterality: Left;  CDE:15.60  . CATARACT EXTRACTION W/PHACO Right 08/16/2013   Procedure: CATARACT EXTRACTION PHACO AND INTRAOCULAR LENS PLACEMENT (IOC);  Surgeon: Tonny Branch, MD;  Location: [facility];  Service: Ophthalmology;  Laterality: Right;  CDE:8.18  . PORTACATH PLACEMENT Left  11/01/2016   Procedure: INSERTION PORT-A-CATH LEFT SUBCLAVIAN;  Surgeon: Aviva Signs, MD;  Location: [facility];  Service: General;  Laterality: Left;    OB History    No data available       Home Medications    Prior to Admission medications   Medication Sig Start Date End Date Taking? Authorizing Provider  aspirin EC 81 MG tablet Take 81 mg by mouth every morning.   Yes [provider]  Cholecalciferol (VITAMIN D PO) Take 1 tablet by mouth daily.   Yes [provider]  enalapril (VASOTEC) 5 MG tablet Take 5 mg by mouth every morning.   Yes [provider]  furosemide (LASIX) 40 MG tablet Take 1 tablet (40 mg total) by mouth daily as needed. Patient taking differently: Take 40 mg by mouth daily as needed for fluid or edema.  10/14/16  Yes Twana First, MD  levothyroxine (SYNTHROID, LEVOTHROID) 25 MCG tablet Take 25 mcg by mouth daily before breakfast.   Yes [provider]  lidocaine-prilocaine (EMLA) cream Apply a quarter size amount to affected area 1 hour prior to coming to chemotherapy. 10/25/16  Yes Holley Bouche, NP  LORazepam (ATIVAN) 0.5 MG tablet Take 1 tablet (0.5 mg total) by mouth every 8 (eight) hours. Patient taking differently: Take 0.5 mg by mouth every 8 (eight) hours as needed for anxiety.  10/28/16  Yes Holley Bouche, NP  metFORMIN (GLUCOPHAGE-XR) 500 MG 24 hr tablet Take 500 mg by mouth daily with supper.    Yes [provider]  omeprazole ([place])  20 MG capsule Take 1 capsule (20 mg total) by mouth daily. 10/11/16  Yes Kefalas, Manon Hilding, PA-C  polyethylene glycol powder (GLYCOLAX/MIRALAX) powder Take 1 capful daily. Patient taking differently: Take 17 g by mouth daily. Take 1 capful daily. 1 capful = 17g 10/28/16  Yes Holley Bouche, NP  potassium chloride 20 MEQ TBCR Take 10 mEq by mouth 2 (two) times daily. Patient taking differently: Take 20 mEq by mouth 2 (two) times daily.  10/28/16  Yes Aviva Signs, MD   pravastatin (PRAVACHOL) 80 MG tablet Take 80 mg by mouth daily.    Yes [provider]  prochlorperazine (COMPAZINE) 10 MG tablet Take 1 tablet (10 mg total) by mouth every 6 (six) hours as needed for nausea or vomiting. 10/25/16  Yes Holley Bouche, NP  spironolactone (ALDACTONE) 25 MG tablet Take 25 mg by mouth 2 (two) times daily.   Yes [provider]  traMADol (ULTRAM) 50 MG tablet Take 1 tablet (50 mg total) by mouth every 6 (six) hours as needed. Patient taking differently: Take 50 mg by mouth every 6 (six) hours as needed (pain).  10/28/16  Yes Holley Bouche, NP  valsartan (DIOVAN) 80 MG tablet Take 80 mg by mouth daily.   Yes [provider]  naproxen sodium (ANAPROX) 220 MG tablet Take 220 mg by mouth daily as needed (pain).     [provider]  ondansetron (ZOFRAN ODT) 4 MG disintegrating tablet 4mg  ODT q4 hours prn nausea/vomit 11/06/16   Milton Ferguson, MD    Family History History reviewed. No pertinent family history.  Social History Social History  Substance Use Topics  . Smoking status: Never Smoker  . Smokeless tobacco: Never Used  . Alcohol use No     Allergies   Patient has no known allergies.   Review of Systems Review of Systems  Constitutional: Negative for appetite change and fatigue.  HENT: Negative for congestion, ear discharge and sinus pressure.   Eyes: Negative for discharge.  Respiratory: Negative for cough.   Cardiovascular: Negative for chest pain.  Gastrointestinal: Positive for vomiting. Negative for abdominal pain and diarrhea.  Genitourinary: Negative for frequency and hematuria.  Musculoskeletal: Negative for back pain.  Skin: Negative for rash.  Neurological: Negative for seizures and headaches.  Psychiatric/Behavioral: Negative for hallucinations.     Physical Exam Updated Vital Signs BP 125/71   Pulse 87   Temp 98.9 F (37.2 C) (Oral)   Resp 14   Ht 5\' 5"  (1.651 m)   Wt 58.5 kg (129  lb)   SpO2 97%   BMI 21.47 kg/m   Physical Exam  Constitutional: She is oriented to person, place, and time. She appears well-developed.  HENT:  Head: Normocephalic.  Eyes: Conjunctivae and EOM are normal. No scleral icterus.  Neck: Neck supple. No thyromegaly present.  Cardiovascular: Normal rate and regular rhythm.  Exam reveals no gallop and no friction rub.   No murmur heard. Pulmonary/Chest: No stridor. She has no wheezes. She has no rales. She exhibits no tenderness.  Abdominal: She exhibits no distension. There is no tenderness. There is no rebound.  Musculoskeletal: Normal range of motion. She exhibits no edema.  Lymphadenopathy:    She has no cervical adenopathy.  Neurological: She is oriented to person, place, and time. She exhibits normal muscle tone. Coordination normal.  Skin: No rash noted. No erythema.  Psychiatric: She has a normal mood and affect. Her behavior is normal.     ED Treatments /  Results  Labs (all labs ordered are listed, but only abnormal results are displayed) Labs Reviewed - No data to display  EKG  EKG Interpretation None       Radiology No results found.  Procedures Procedures (including critical care time)  Medications Ordered in ED Medications  sodium chloride 0.9 % bolus 500 mL (0 mLs Intravenous Stopped 11/06/16 1144)  ondansetron (ZOFRAN) injection 4 mg (4 mg Intravenous Given 11/06/16 1110)     Initial Impression / Assessment and Plan / ED Course  I have reviewed the triage vital signs and the nursing notes.  Pertinent labs & imaging results that were available during my care of the patient were reviewed by me and considered in my medical decision making (see chart for details).     Patient on chemotherapy. Patient had 2 episodes of vomiting in 2 days. She improved with Zofran and fluids in the emergency department. She will be discharged home on Zofran and will follow-up with her doctor  Final Clinical Impressions(s) /  ED Diagnoses   Final diagnoses:  Acute vomiting    New Prescriptions New Prescriptions   ONDANSETRON (ZOFRAN ODT) 4 MG DISINTEGRATING TABLET    4mg  ODT q4 hours prn nausea/vomit     Milton Ferguson, MD 11/06/16 1345

## 2016-11-06 NOTE — ED Triage Notes (Signed)
Pt reports nausea and vomiting for last several days. Pt reports last chemo infusion on 11/03/16. Pt reports is to have blood drawn on 11/10/16.

## 2016-11-08 ENCOUNTER — Encounter (HOSPITAL_COMMUNITY): Payer: Self-pay

## 2016-11-08 ENCOUNTER — Telehealth (HOSPITAL_COMMUNITY): Payer: Self-pay | Admitting: *Deleted

## 2016-11-08 ENCOUNTER — Encounter (HOSPITAL_COMMUNITY): Payer: Medicare Other | Attending: Adult Health

## 2016-11-08 DIAGNOSIS — Z5189 Encounter for other specified aftercare: Secondary | ICD-10-CM | POA: Diagnosis not present

## 2016-11-08 DIAGNOSIS — C786 Secondary malignant neoplasm of retroperitoneum and peritoneum: Secondary | ICD-10-CM

## 2016-11-08 MED ORDER — SODIUM CHLORIDE 0.9 % IV SOLN
INTRAVENOUS | Status: DC
  Administered 2016-11-08: 14:00:00 via INTRAVENOUS

## 2016-11-08 MED ORDER — HEPARIN SOD (PORK) LOCK FLUSH 100 UNIT/ML IV SOLN
500.0000 [IU] | Freq: Once | INTRAVENOUS | Status: AC
  Administered 2016-11-08: 500 [IU] via INTRAVENOUS
  Filled 2016-11-08: qty 5

## 2016-11-08 NOTE — Progress Notes (Signed)
Patient presented today for hydration fluids. Patient tolerated it well. No problems. Vitals stable and discharged home from clinic via wheelchair.follow up as scheduled.

## 2016-11-08 NOTE — Telephone Encounter (Signed)
We had a fax from the call center this morning that patient was vomiting over the weekend and had taken Zofran without relief.  The call center RN had advised the patient to report to the emergency room.  I attempted to call the patient and was met with patient's home voice mail. I left message advising patient that we were aware of her concerns over the weekend and for her to call our clinic today if she should need anything further. I left our number and advised her to ask for any of the nurses.

## 2016-11-09 ENCOUNTER — Other Ambulatory Visit (HOSPITAL_COMMUNITY): Payer: Self-pay | Admitting: Oncology

## 2016-11-10 ENCOUNTER — Encounter (HOSPITAL_BASED_OUTPATIENT_CLINIC_OR_DEPARTMENT_OTHER): Payer: Medicare Other | Admitting: Adult Health

## 2016-11-10 ENCOUNTER — Encounter (HOSPITAL_BASED_OUTPATIENT_CLINIC_OR_DEPARTMENT_OTHER): Payer: Medicare Other

## 2016-11-10 ENCOUNTER — Encounter (HOSPITAL_COMMUNITY): Payer: Medicare Other

## 2016-11-10 ENCOUNTER — Other Ambulatory Visit (HOSPITAL_COMMUNITY): Payer: Self-pay

## 2016-11-10 ENCOUNTER — Encounter (HOSPITAL_COMMUNITY): Payer: Self-pay

## 2016-11-10 VITALS — BP 112/53 | HR 87 | Temp 98.0°F | Resp 18 | Wt 120.5 lb

## 2016-11-10 DIAGNOSIS — K5900 Constipation, unspecified: Secondary | ICD-10-CM

## 2016-11-10 DIAGNOSIS — Z5189 Encounter for other specified aftercare: Secondary | ICD-10-CM

## 2016-11-10 DIAGNOSIS — C786 Secondary malignant neoplasm of retroperitoneum and peritoneum: Secondary | ICD-10-CM

## 2016-11-10 DIAGNOSIS — C801 Malignant (primary) neoplasm, unspecified: Secondary | ICD-10-CM | POA: Diagnosis not present

## 2016-11-10 DIAGNOSIS — R634 Abnormal weight loss: Secondary | ICD-10-CM | POA: Diagnosis not present

## 2016-11-10 DIAGNOSIS — F419 Anxiety disorder, unspecified: Secondary | ICD-10-CM | POA: Diagnosis not present

## 2016-11-10 DIAGNOSIS — R531 Weakness: Secondary | ICD-10-CM

## 2016-11-10 DIAGNOSIS — C481 Malignant neoplasm of specified parts of peritoneum: Secondary | ICD-10-CM

## 2016-11-10 DIAGNOSIS — R300 Dysuria: Secondary | ICD-10-CM | POA: Diagnosis not present

## 2016-11-10 LAB — CBC WITH DIFFERENTIAL/PLATELET
Basophils Absolute: 0 10*3/uL (ref 0.0–0.1)
Basophils Relative: 0 %
EOS PCT: 2 %
Eosinophils Absolute: 0.1 10*3/uL (ref 0.0–0.7)
HCT: 28.7 % — ABNORMAL LOW (ref 36.0–46.0)
Hemoglobin: 8.9 g/dL — ABNORMAL LOW (ref 12.0–15.0)
LYMPHS ABS: 1.1 10*3/uL (ref 0.7–4.0)
LYMPHS PCT: 23 %
MCH: 25.8 pg — AB (ref 26.0–34.0)
MCHC: 31 g/dL (ref 30.0–36.0)
MCV: 83.2 fL (ref 78.0–100.0)
MONO ABS: 0.2 10*3/uL (ref 0.1–1.0)
MONOS PCT: 4 %
Neutro Abs: 3.4 10*3/uL (ref 1.7–7.7)
Neutrophils Relative %: 71 %
PLATELETS: 303 10*3/uL (ref 150–400)
RBC: 3.45 MIL/uL — ABNORMAL LOW (ref 3.87–5.11)
RDW: 16.8 % — AB (ref 11.5–15.5)
WBC: 4.8 10*3/uL (ref 4.0–10.5)

## 2016-11-10 LAB — COMPREHENSIVE METABOLIC PANEL
ALT: 21 U/L (ref 14–54)
AST: 24 U/L (ref 15–41)
Albumin: 3.4 g/dL — ABNORMAL LOW (ref 3.5–5.0)
Alkaline Phosphatase: 72 U/L (ref 38–126)
Anion gap: 11 (ref 5–15)
BUN: 13 mg/dL (ref 6–20)
CALCIUM: 9.1 mg/dL (ref 8.9–10.3)
CHLORIDE: 92 mmol/L — AB (ref 101–111)
CO2: 30 mmol/L (ref 22–32)
Creatinine, Ser: 0.7 mg/dL (ref 0.44–1.00)
Glucose, Bld: 111 mg/dL — ABNORMAL HIGH (ref 65–99)
POTASSIUM: 3.5 mmol/L (ref 3.5–5.1)
Sodium: 133 mmol/L — ABNORMAL LOW (ref 135–145)
TOTAL PROTEIN: 7.6 g/dL (ref 6.5–8.1)
Total Bilirubin: 0.7 mg/dL (ref 0.3–1.2)

## 2016-11-10 MED ORDER — SODIUM CHLORIDE 0.9% FLUSH
10.0000 mL | Freq: Once | INTRAVENOUS | Status: AC
  Administered 2016-11-10: 10 mL via INTRAVENOUS

## 2016-11-10 MED ORDER — HEPARIN SOD (PORK) LOCK FLUSH 100 UNIT/ML IV SOLN
500.0000 [IU] | Freq: Once | INTRAVENOUS | Status: AC
  Administered 2016-11-10: 500 [IU] via INTRAVENOUS

## 2016-11-10 MED ORDER — HEPARIN SOD (PORK) LOCK FLUSH 100 UNIT/ML IV SOLN
INTRAVENOUS | Status: AC
  Filled 2016-11-10: qty 5

## 2016-11-10 MED ORDER — SODIUM CHLORIDE 0.9 % IV SOLN
INTRAVENOUS | Status: DC
  Administered 2016-11-10: 1000 mL via INTRAVENOUS

## 2016-11-10 NOTE — Patient Instructions (Signed)
[place] at [facility]  Discharge Instructions:  You received hydration today.  Keep scheduled appointments and call for any problems or questions.   _______________________________________________________________  Thank you for choosing [place] at [facility] to provide your oncology and hematology care.  To afford each patient quality time with our providers, please arrive at least 15 minutes before your scheduled appointment.  You need to re-schedule your appointment if you arrive 10 or more minutes late.  We strive to give you quality time with our providers, and arriving late affects you and other patients whose appointments are after yours.  Also, if you no show three or more times for appointments you may be dismissed from the clinic.  Again, thank you for choosing [place] at [facility] hope is that these requests will allow you access to exceptional care and in a timely manner. _______________________________________________________________  If you have questions after your visit, please contact our office at [phone] between the hours of 8:30 a.m. and 5:00 p.m. Voicemails left after 4:30 p.m. will not be returned until the following business day. _______________________________________________________________  For prescription refill requests, have your pharmacy contact our office. _______________________________________________________________  Recommendations made by the consultant and any test results will be sent to your referring physician. _______________________________________________________________

## 2016-11-10 NOTE — Progress Notes (Signed)
Patient tolerated hydration with no complaints voiced.  Port site clean and dry with no bruising or swelling noted at site.  Band aid applied.  VSS with discharge.  Instructed the patient on good fluid intake and foods with understanding verbalized and by family.  Patient left via wheelchair with family at side.

## 2016-11-10 NOTE — Patient Instructions (Signed)
[place] at [facility] Discharge Instructions  RECOMMENDATIONS MADE BY THE CONSULTANT AND ANY TEST RESULTS WILL BE SENT TO YOUR REFERRING PHYSICIAN.  One liter of fluids given today  Follow up as scheduled.  Thank you for choosing [place] at [facility] to provide your oncology and hematology care.  To afford each patient quality time with our provider, please arrive at least 15 minutes before your scheduled appointment time.    If you have a lab appointment with the [facility] please come in thru the  Main Entrance and check in at the main information desk  You need to re-schedule your appointment should you arrive 10 or more minutes late.  We strive to give you quality time with our providers, and arriving late affects you and other patients whose appointments are after yours.  Also, if you no show three or more times for appointments you may be dismissed from the clinic at the providers discretion.     Again, thank you for choosing [facility].  Our hope is that these requests will decrease the amount of time that you wait before being seen by our physicians.       _____________________________________________________________  Should you have questions after your visit to [facility], please contact our office at [phone] between the hours of 8:30 a.m. and 4:30 p.m.  Voicemails left after 4:30 p.m. will not be returned until the following business day.  For prescription refill requests, have your pharmacy contact our office.       Resources For Cancer Patients and their Caregivers ? American Cancer Society: Can assist with transportation, wigs, general needs, runs Look Good Feel Better.        414-466-7270 ? Cancer Care: Provides financial assistance, online support groups, medication/co-pay assistance.  1-800-813-HOPE 571-732-4831) ? [place] Assists [place] cancer  patients and their families through emotional , educational and financial support.  629-535-3100 ? [place] DSS Where to apply for food stamps, Medicaid and utility assistance. 530-602-9475 ? [facility]: Transportation to medical appointments. 416-646-2567 ? Social Security Administration: May apply for disability if have a Stage IV cancer. (912) 775-0868 779-788-9764 ? [employer], Disability and Transit Services: Assists with nutrition, care and transit needs. [place] Support Programs: @10RELATIVEDAYS @ > Cancer Support Group  2nd Tuesday of the month 1pm-2pm, Journey Room  > Creative Journey  3rd Tuesday of the month 1130am-1pm, Journey Room  > Look Good Feel Better  1st Wednesday of the month 10am-12 noon, Journey Room (Call [place] to register [phone])

## 2016-11-11 ENCOUNTER — Ambulatory Visit (HOSPITAL_COMMUNITY): Payer: Medicare Other | Admitting: Adult Health

## 2016-11-11 ENCOUNTER — Ambulatory Visit (HOSPITAL_COMMUNITY): Payer: Medicare Other

## 2016-11-11 ENCOUNTER — Other Ambulatory Visit (HOSPITAL_COMMUNITY): Payer: Medicare Other

## 2016-11-12 ENCOUNTER — Telehealth (HOSPITAL_COMMUNITY): Payer: Self-pay

## 2016-11-12 NOTE — Telephone Encounter (Signed)
Nutrition  Patient identified on Malnutrition Screening Tool for weight loss and poor appetite.  Called patient and left message asking patient to call me back.    Harout Scheurich B. Zenia Resides, [place], [place] Registered Dietitian [phone] (pager)

## 2016-11-14 NOTE — Progress Notes (Signed)
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 Serita Grit note on 10/20/16)  Sherry Hopkins is a 76 y.o. female with a medical history significant for hypertension, hypothyroidism, type 2 diabetes, GERD who is referred to the [facility] for peritoneal carcinomatosis.  Patient reported emergency department on 10/07/2016 with abdominal pain. She reported at that time that it started approximately 1 week ago.. This is accompanied by nausea with vomiting. She notes 5-10 emeses per day. Emeses has the appearance of stomach contents. At that time, she denied any fevers.  In the emergency department, CT imaging of abdomen and pelvis was performed.   --- [name]  is a 76 y.o.  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 She has severe protein malnutrition and debilitation from her disease.   Given that [name] is of such poor performance status and nutritional status I do not think that she is a good candidate for primary upfront debulking surgery.  Instead I am recommending neoadjuvant chemotherapy with 3 cycles of carboplatin and paclitaxel followed by repeat imaging and interval debulking 3 weeks after cycle 3 should she demonstrate good response to therapy and improved underlying health.     INTERVAL HISTORY:  [name] 76 y.o. female returns for routine follow-up for ovarian cancer/primary peritoneal cancer.   S/p port-a-cath placement on 11/01/16 with Dr. Arnoldo Morale; procedure went well.   She is here today for supportive care with IVF.  She started chemo with  Carbo/Taxol on 11/03/16. She had ED visit on 11/06/16 for N&V; reports that her nausea symptoms are now resolved.  She was given IVF and Zofran in the ED and symptoms improved before she was discharged home in stable condition.    Review of systems is a little difficult as she is not interested in talking very much. She appears slightly anxious. Does state "my chest feels tight"; denies any chest pain. VS are stable and no other associated symptoms concerning for cardiac etiology. When asked if she ever tried any Ativan that was prescribed at her last visit, it was difficult for her to tell me; "I think I may have taken a pill, but I'm not sure. I have no many pills."    Reports some issues with constipation. She has been trying Miralax once daily per report. She has taken some Milk of Magnesia with results.   Chart reviewed; she has lost ~12 lbs in the past 2 weeks and ~27 lbs in the past month.     REVIEW OF SYSTEMS:  Review of Systems  Constitutional: Positive for appetite change, fatigue and unexpected weight change. Negative for chills and fever.  HENT:  Negative.  Negative for mouth sores and nosebleeds.   Eyes: Negative.   Respiratory: Negative.   Cardiovascular: Negative.   Gastrointestinal: Positive for constipation. Negative for abdominal pain, diarrhea, nausea and vomiting.  Endocrine: Negative.   Genitourinary: Negative.  Negative for dysuria and hematuria.   Neurological: Positive for extremity weakness (feels weak). Negative for dizziness and headaches.  Hematological: Negative.   Psychiatric/Behavioral: The patient is nervous/anxious.      PAST  MEDICAL/SURGICAL HISTORY:  Past Medical History:  Diagnosis Date  . Diabetes mellitus type 2, diet-controlled (HCC)    diet controlled  . Hypercholesteremia   . Hypertension   . Hypothyroidism   . Iron deficiency anemia 10/12/2016  . Peritoneal carcinomatosis ([facility]) 10/11/2016   Past Surgical History:  Procedure Laterality Date   . ABDOMINAL HYSTERECTOMY    . CATARACT EXTRACTION W/PHACO  08/23/2011   Procedure: CATARACT EXTRACTION PHACO AND INTRAOCULAR LENS PLACEMENT (IOC);  Surgeon: Tonny Branch, MD;  Location: [facility];  Service: Ophthalmology;  Laterality: Left;  CDE:15.60  . CATARACT EXTRACTION W/PHACO Right 08/16/2013   Procedure: CATARACT EXTRACTION PHACO AND INTRAOCULAR LENS PLACEMENT (IOC);  Surgeon: Tonny Branch, MD;  Location: [facility];  Service: Ophthalmology;  Laterality: Right;  CDE:8.18  . PORTACATH PLACEMENT Left 11/01/2016   Procedure: INSERTION PORT-A-CATH LEFT SUBCLAVIAN;  Surgeon: Aviva Signs, MD;  Location: [facility];  Service: General;  Laterality: Left;     SOCIAL HISTORY:  Social History   Social History  . Marital status: Divorced    Spouse name: N/A  . Number of children: N/A  . Years of education: N/A   Occupational History  . Not on file.   Social History Main Topics  . Smoking status: Never Smoker  . Smokeless tobacco: Never Used  . Alcohol use No  . Drug use: No  . Sexual activity: Yes    Birth control/ protection: Surgical   Other Topics Concern  . Not on file   Social History Narrative  . No narrative on file    FAMILY HISTORY:  No family history on file.  CURRENT MEDICATIONS:  Outpatient Encounter Prescriptions as of 11/10/2016  Medication Sig Note  . aspirin EC 81 MG tablet Take 81 mg by mouth every morning.   . Cholecalciferol (VITAMIN D PO) Take 1 tablet by mouth daily.   . enalapril (VASOTEC) 5 MG tablet Take 5 mg by mouth every morning. 9/38/1829: Patient is uncertain if she is still taking this medication or not  . furosemide (LASIX) 40 MG tablet Take 1 tablet (40 mg total) by mouth daily as needed. (Patient taking differently: Take 40 mg by mouth daily as needed for fluid or edema. )   . levothyroxine (SYNTHROID, LEVOTHROID) 25 MCG tablet Take 25 mcg by mouth daily before breakfast.   . lidocaine-prilocaine (EMLA) cream Apply a quarter size amount to affected area 1  hour prior to coming to chemotherapy.   Marland Kitchen LORazepam (ATIVAN) 0.5 MG tablet Take 1 tablet (0.5 mg total) by mouth every 8 (eight) hours. (Patient taking differently: Take 0.5 mg by mouth every 8 (eight) hours as needed for anxiety. )   . metFORMIN (GLUCOPHAGE-XR) 500 MG 24 hr tablet Take 500 mg by mouth daily with supper.    . naproxen sodium (ANAPROX) 220 MG tablet Take 220 mg by mouth daily as needed (pain).    Marland Kitchen omeprazole (PRILOSEC) 20 MG capsule Take 1 capsule (20 mg total) by mouth daily. 10/29/2016: Sometimes forgets to take.  . ondansetron (ZOFRAN ODT) 4 MG disintegrating tablet 4mg  ODT q4 hours prn nausea/vomit   . polyethylene glycol powder (GLYCOLAX/MIRALAX) powder Take 1 capful daily. (Patient taking differently: Take 17 g by mouth daily. Take 1 capful daily. 1 capful = 17g)   . potassium chloride 20 MEQ TBCR Take 10 mEq by mouth 2 (two) times daily. (Patient taking differently: Take 20 mEq by mouth 2 (two) times daily. ) 10/29/2016: Seems confused about this med.  . pravastatin (PRAVACHOL)  80 MG tablet Take 80 mg by mouth daily.    . prochlorperazine (COMPAZINE) 10 MG tablet Take 1 tablet (10 mg total) by mouth every 6 (six) hours as needed for nausea or vomiting.   Marland Kitchen spironolactone (ALDACTONE) 25 MG tablet Take 25 mg by mouth 2 (two) times daily. 10/29/2016: Forgets the second dose often.  . traMADol (ULTRAM) 50 MG tablet Take 1 tablet (50 mg total) by mouth every 6 (six) hours as needed. (Patient taking differently: Take 50 mg by mouth every 6 (six) hours as needed (pain). )   . valsartan (DIOVAN) 80 MG tablet Take 80 mg by mouth daily.   . [EXPIRED] heparin lock flush 100 unit/mL    . [EXPIRED] sodium chloride flush (NS) 0.9 % injection 10 mL    . [DISCONTINUED] 0.9 %  sodium chloride infusion    . [DISCONTINUED] 0.9 %  sodium chloride infusion     No facility-administered encounter medications on file as of 11/10/2016.     ALLERGIES:  No Known Allergies   PHYSICAL EXAM:  ECOG  Performance status: 2-3 - Symptomatic; requires assistance     Physical Exam  Constitutional: She is oriented to person, place, and time.  Thin frail female in no acute distress -Seen in infusion chair in chemo area   HENT:  Head: Normocephalic.  Mouth/Throat: Oropharynx is clear and moist.  Eyes: Conjunctivae are normal. No scleral icterus.  Neck: Normal range of motion. Neck supple.  Cardiovascular: Normal rate and regular rhythm.   Pulmonary/Chest: Effort normal and breath sounds normal. No respiratory distress.  Abdominal: Soft. Bowel sounds are normal. There is no tenderness.  Musculoskeletal: Normal range of motion. She exhibits no edema.  Lymphadenopathy:    She has no cervical adenopathy.  Neurological: She is alert and oriented to person, place, and time.  Skin: Skin is warm and dry. No rash noted.  Psychiatric: Judgment normal. Her mood appears anxious. She has a flat affect.  Appears slightly anxious; does not make eye contact often. Mildly flat affect.   Nursing note and vitals reviewed.    LABORATORY DATA:  I have reviewed the labs as listed.  CBC    Component Value Date/Time   WBC 4.8 11/10/2016 1112   RBC 3.45 (L) 11/10/2016 1112   HGB 8.9 (L) 11/10/2016 1112   HCT 28.7 (L) 11/10/2016 1112   PLT 303 11/10/2016 1112   MCV 83.2 11/10/2016 1112   MCH 25.8 (L) 11/10/2016 1112   MCHC 31.0 11/10/2016 1112   RDW 16.8 (H) 11/10/2016 1112   LYMPHSABS 1.1 11/10/2016 1112   MONOABS 0.2 11/10/2016 1112   EOSABS 0.1 11/10/2016 1112   BASOSABS 0.0 11/10/2016 1112   CMP Latest Ref Rng & Units 11/10/2016 11/03/2016 10/28/2016  Glucose 65 - 99 mg/dL 111(H) 94 89  BUN 6 - 20 mg/dL 13 8 8   Creatinine 0.44 - 1.00 mg/dL 0.70 0.59 0.73  Sodium 135 - 145 mmol/L 133(L) 138 135  Potassium 3.5 - 5.1 mmol/L 3.5 3.2(L) 3.1(L)  Chloride 101 - 111 mmol/L 92(L) 95(L) 87(L)  CO2 22 - 32 mmol/L 30 34(H) 35(H)  Calcium 8.9 - 10.3 mg/dL 9.1 8.8(L) 8.4(L)  Total Protein 6.5 - 8.1 g/dL  7.6 7.3 6.9  Total Bilirubin 0.3 - 1.2 mg/dL 0.7 0.7 0.6  Alkaline Phos 38 - 126 U/L 72 67 65  AST 15 - 41 U/L 24 24 18   ALT 14 - 54 U/L 21 18 10(L)    PENDING LABS:  DIAGNOSTIC IMAGING:  *The following radiologic images and reports have been reviewed independently and agree with below findings.  CT abd/pelvis: 10/07/16 CLINICAL DATA:  Abdominal pain  EXAM: CT ABDOMEN AND PELVIS WITH CONTRAST  TECHNIQUE: Multidetector CT imaging of the abdomen and pelvis was performed using the standard protocol following bolus administration of intravenous contrast.  CONTRAST:  126mL ISOVUE-300 IOPAMIDOL (ISOVUE-300) INJECTION 61%  COMPARISON:  None.  FINDINGS: Lower chest: No acute abnormality.  Hepatobiliary: No focal liver abnormality. The gallbladder is normal. No biliary dilatation.  Pancreas: There is a normal appearance of the pancreas.  Spleen: Spleen is unremarkable  Adrenals/Urinary Tract: The adrenal glands both appear normal. 1 cm cyst arises from the midpole of left kidney. Urinary bladder is not well seen.  Stomach/Bowel: Moderate size hiatal hernia. No pathologic dilatation of the large or small bowel loops.  Vascular/Lymphatic: Aortic atherosclerosis. No enlarged retroperitoneal lymph nodes. No pelvic or inguinal adenopathy.  Reproductive: The uterus is surgically absent. The adnexal structures are not well visualized.  Other: There is a marked volume of complex and loculated ascites within the abdomen and pelvis. There is evidence of extensive peritoneal carcinomatosis with omental cake measuring 14.1 x 2.5 by 6.9 cm. There are numerous calcified peritoneal deposits identified predominantly involving the dependent portions of the peritoneal cavity.  Musculoskeletal: No acute or significant osseous findings.  IMPRESSION: 1. There is evidence of extensive peritoneal carcinomatosis including large volume of loculated ascites, omental caking,  and calcified peritoneal deposits. The patient is status post hysterectomy. If the patient still has ovaries then this would be a likely origin. Alternatively GI malignancies may present with peritoneal carcinomatosis. Oncologic consultation is advised. 2. At this time there is no evidence for high-grade bowel obstruction or obstructive uropathy.   Electronically Signed   By: Kerby Moors M.D.   On: 10/07/2016 16:35   CT chest: 10/18/16 CLINICAL DATA:  Peritoneal carcinomatosis.  EXAM: CT CHEST WITHOUT CONTRAST  TECHNIQUE: Multidetector CT imaging of the chest was performed following the standard protocol without IV contrast.  COMPARISON:  Abdomen pelvis CT from 10/07/2016  FINDINGS: Cardiovascular: The heart is enlarged. No substantial pericardial effusion. Coronary artery calcification is noted. Atherosclerotic calcification is noted in the wall of the thoracic aorta.  Mediastinum/Nodes: No mediastinal lymphadenopathy. Index precarinal lymph node is 9 mm in short axis. No evidence for gross hilar lymphadenopathy although assessment is limited by the lack of intravenous contrast on today's study. Tiny hiatal hernia. Esophagus otherwise unremarkable. There is no axillary lymphadenopathy.  Lungs/Pleura: Subpleural atelectasis noted in the lung bases. No suspicious pulmonary nodule or mass. No focal airspace consolidation. No pulmonary edema. Tiny left pleural effusion noted.  Upper Abdomen: Intraperitoneal fluid again evident, better documented on the recent abdomen and pelvis CT.  Musculoskeletal: Bone windows reveal no worrisome lytic or sclerotic osseous lesions.  IMPRESSION: 1. Tiny left pleural effusion with basilar atelectasis in both lungs. No suspicious pulmonary nodule or mass. 2.  Emphysema. (ICD10-J43.9) 3.  Aortic Atherosclerois (ICD10-170.0)   Electronically Signed   By: Misty Stanley M.D.   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 Evaluated by Gyn-Onc (Dr. Denman George) who felt that patient was not a candidate for upfront debulking surgery d/t poor performance status and protein/calorie malnutrition. Dr. Denman George has recommended Cdarbo/Taxol x 3 cycles, followed by restaging imaging  and consideration for debulking surgery about 3 weeks after cycle #3. She may require additional chemotherapy in the adjuvant setting as well.  -s/p cycle #1 Carbo/Taxol on 11/03/16. We have been providing IVF support both prior to her chemo initiation, as well as post-chemo. She is here today for supportive care IVF d/t poor intake.  She also had ED visit on 11/06/16 for N&V; symptoms have since resolved.  -May need to consider adding on twice weekly IVF for at least the first 2 weeks after chemo; can discuss with her at next visit when she returns for cycle #2.  -Return to cancer center for follow-up and cycle #2 as directed.   Anxiety about health:  -Ativan PRN prescription given at last visit. She is unsure if she has taken any of the medication. "I think my daughter gave me one, but I'm not sure." She appears anxious during today's visit.  -Provided emotional support and reassurance today through validation of her concerns and active listening.  -Chaplain was consulted by nursing and she was seen by Luz Brazen, [place] today during her IVF.   Weight loss:  -She has lost ~12 lbs in the past 2 weeks; about 27 lbs in the past month which is very concerning.  -Recommended she increase Ensure/Boost intake as tolerated; stressed the importance of adequate protein and caloric intake during chemo. Inadequate nutrition will also make healing from any potential surgery in the future very difficult.  -Will also consult Nutrition for additional support  Constipation:  -Likely multifactorial given chemo, disease  burden, dehydration, and decreased nutritional intake.  -Recommended she increase Miralax to BID with stool softeners if needed.  Also encouraged her to increase fluid intake and activity, as tolerated.     Dispo:  -Return to cancer center as scheduled for cycle #2 chemotherapy.    All questions were answered to patient's stated satisfaction. Encouraged patient to call with any new concerns or questions before her next visit to the cancer center and we can certain see her sooner, if needed.    Plan of care discussed with Dr. Talbert Cage, who agrees with the above aforementioned.    Orders placed this encounter:  Orders Placed This Encounter  Procedures  . Amb Referral to Nutrition and Diabetic E      Mike Craze, NP [place] [phone]

## 2016-11-18 ENCOUNTER — Other Ambulatory Visit (HOSPITAL_COMMUNITY): Payer: Medicare Other

## 2016-11-18 ENCOUNTER — Ambulatory Visit (HOSPITAL_COMMUNITY): Payer: Medicare Other | Admitting: Adult Health

## 2016-11-24 ENCOUNTER — Encounter (HOSPITAL_COMMUNITY): Payer: Self-pay

## 2016-11-24 ENCOUNTER — Encounter (HOSPITAL_COMMUNITY): Payer: Medicare Other

## 2016-11-24 ENCOUNTER — Encounter (HOSPITAL_BASED_OUTPATIENT_CLINIC_OR_DEPARTMENT_OTHER): Payer: Medicare Other | Admitting: Oncology

## 2016-11-24 ENCOUNTER — Encounter (HOSPITAL_COMMUNITY): Payer: Medicare Other | Attending: Oncology

## 2016-11-24 VITALS — BP 121/74 | HR 86 | Temp 98.1°F | Resp 18 | Wt 115.1 lb

## 2016-11-24 DIAGNOSIS — C801 Malignant (primary) neoplasm, unspecified: Principal | ICD-10-CM

## 2016-11-24 DIAGNOSIS — C786 Secondary malignant neoplasm of retroperitoneum and peritoneum: Secondary | ICD-10-CM

## 2016-11-24 DIAGNOSIS — Z5111 Encounter for antineoplastic chemotherapy: Secondary | ICD-10-CM | POA: Diagnosis not present

## 2016-11-24 DIAGNOSIS — R634 Abnormal weight loss: Secondary | ICD-10-CM | POA: Diagnosis not present

## 2016-11-24 DIAGNOSIS — C481 Malignant neoplasm of specified parts of peritoneum: Secondary | ICD-10-CM

## 2016-11-24 LAB — CBC WITH DIFFERENTIAL/PLATELET
Basophils Absolute: 0 10*3/uL (ref 0.0–0.1)
Basophils Relative: 1 %
EOS ABS: 1.7 10*3/uL — AB (ref 0.0–0.7)
EOS PCT: 19 %
HCT: 31.9 % — ABNORMAL LOW (ref 36.0–46.0)
Hemoglobin: 9.9 g/dL — ABNORMAL LOW (ref 12.0–15.0)
LYMPHS ABS: 1.8 10*3/uL (ref 0.7–4.0)
Lymphocytes Relative: 20 %
MCH: 25.6 pg — AB (ref 26.0–34.0)
MCHC: 31 g/dL (ref 30.0–36.0)
MCV: 82.6 fL (ref 78.0–100.0)
MONO ABS: 0.8 10*3/uL (ref 0.1–1.0)
Monocytes Relative: 9 %
Neutro Abs: 4.6 10*3/uL (ref 1.7–7.7)
Neutrophils Relative %: 51 %
PLATELETS: 335 10*3/uL (ref 150–400)
RBC: 3.86 MIL/uL — AB (ref 3.87–5.11)
RDW: 18.1 % — AB (ref 11.5–15.5)
WBC: 8.8 10*3/uL (ref 4.0–10.5)

## 2016-11-24 LAB — COMPREHENSIVE METABOLIC PANEL
ALT: 16 U/L (ref 14–54)
ANION GAP: 9 (ref 5–15)
AST: 23 U/L (ref 15–41)
Albumin: 3.6 g/dL (ref 3.5–5.0)
Alkaline Phosphatase: 67 U/L (ref 38–126)
BUN: 9 mg/dL (ref 6–20)
CHLORIDE: 97 mmol/L — AB (ref 101–111)
CO2: 34 mmol/L — AB (ref 22–32)
Calcium: 9.2 mg/dL (ref 8.9–10.3)
Creatinine, Ser: 0.72 mg/dL (ref 0.44–1.00)
GFR calc non Af Amer: >60 mL/min (ref >60)
Glucose, Bld: 97 mg/dL (ref 65–99)
POTASSIUM: 3.5 mmol/L (ref 3.5–5.1)
SODIUM: 140 mmol/L (ref 135–145)
Total Bilirubin: 0.4 mg/dL (ref 0.3–1.2)
Total Protein: 7.4 g/dL (ref 6.5–8.1)

## 2016-11-24 MED ORDER — SODIUM CHLORIDE 0.9 % IV SOLN
353.0000 mg | Freq: Once | INTRAVENOUS | Status: AC
  Administered 2016-11-24: 350 mg via INTRAVENOUS
  Filled 2016-11-24: qty 35

## 2016-11-24 MED ORDER — SODIUM CHLORIDE 0.9 % IV SOLN
Freq: Once | INTRAVENOUS | Status: AC
  Administered 2016-11-24: 11:00:00 via INTRAVENOUS

## 2016-11-24 MED ORDER — PALONOSETRON HCL INJECTION 0.25 MG/5ML
0.2500 mg | Freq: Once | INTRAVENOUS | Status: AC
  Administered 2016-11-24: 0.25 mg via INTRAVENOUS
  Filled 2016-11-24: qty 5

## 2016-11-24 MED ORDER — FAMOTIDINE IN NACL 20-0.9 MG/50ML-% IV SOLN
20.0000 mg | Freq: Once | INTRAVENOUS | Status: AC
  Administered 2016-11-24: 20 mg via INTRAVENOUS
  Filled 2016-11-24: qty 50

## 2016-11-24 MED ORDER — DEXAMETHASONE SODIUM PHOSPHATE 100 MG/10ML IJ SOLN
20.0000 mg | Freq: Once | INTRAMUSCULAR | Status: AC
  Administered 2016-11-24: 20 mg via INTRAVENOUS
  Filled 2016-11-24: qty 2

## 2016-11-24 MED ORDER — HEPARIN SOD (PORK) LOCK FLUSH 100 UNIT/ML IV SOLN
500.0000 [IU] | Freq: Once | INTRAVENOUS | Status: AC | PRN
  Administered 2016-11-24: 500 [IU]
  Filled 2016-11-24: qty 5

## 2016-11-24 MED ORDER — PACLITAXEL CHEMO INJECTION 300 MG/50ML
175.0000 mg/m2 | Freq: Once | INTRAVENOUS | Status: AC
  Administered 2016-11-24: 288 mg via INTRAVENOUS
  Filled 2016-11-24: qty 48

## 2016-11-24 MED ORDER — DIPHENHYDRAMINE HCL 50 MG/ML IJ SOLN
50.0000 mg | Freq: Once | INTRAMUSCULAR | Status: AC
  Administered 2016-11-24: 50 mg via INTRAVENOUS
  Filled 2016-11-24: qty 1

## 2016-11-24 NOTE — Progress Notes (Signed)
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 Serita Grit note on 10/20/16)  Sherry Hopkins is a 76 y.o. female with a medical history significant for hypertension, hypothyroidism, type 2 diabetes, GERD who is referred to the [facility] for peritoneal carcinomatosis.  Patient reported emergency department on 10/07/2016 with abdominal pain. She reported at that time that it started approximately 1 week ago.. This is accompanied by nausea with vomiting. She notes 5-10 emeses per day. Emeses has the appearance of stomach contents. At that time, she denied any fevers.  In the emergency department, CT imaging of abdomen and pelvis was performed.   --- [name]  is a 76 y.o.  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 She has severe protein malnutrition and debilitation from her disease.   Given that Ms Sherry Hopkins is of such poor performance status and nutritional status I do not think that she is a good candidate for primary upfront debulking surgery.  Instead I am recommending neoadjuvant chemotherapy with 3 cycles of carboplatin and paclitaxel followed by repeat imaging and interval debulking 3 weeks after cycle 3 should she demonstrate good response to therapy and improved underlying health.  S/p port-a-cath placement on 11/01/16 with Dr. Arnoldo Morale; procedure went well.   She is here today for supportive care with IVF.  She started chemo with Carbo/Taxol on 11/03/16. She had ED visit on 11/06/16 for N&V; reports that her nausea symptoms are now resolved.  She was given IVF and Zofran  in the ED and symptoms improved before she was discharged home in stable condition.     INTERVAL HISTORY:  [name] 76 y.o. female returns for routine follow-up for ovarian cancer/primary peritoneal cancer.   Patient presents today for cycle 2 carboplatin and Taxol. She states that her appetite is still pretty poor. She is lost about 5 pounds since her last visit on 11/10/16. She complains of fatigue with generalized weakness and intermittent dizziness. She denies any recent falls. She denies any chest pain, shortness breath, abdominal pain, focal weakness, nausea, vomiting, diarrhea.    REVIEW OF SYSTEMS:  Review of Systems  Constitutional: Positive for fatigue. Negative for appetite change, chills, fever and unexpected weight change.  HENT:  Negative.  Negative for mouth sores and nosebleeds.   Eyes: Negative.   Respiratory: Negative.   Cardiovascular: Negative.   Gastrointestinal: Negative for abdominal pain, constipation, diarrhea, nausea and vomiting.  Endocrine: Negative.   Genitourinary: Negative.  Negative for dysuria and hematuria.   Neurological: Positive for dizziness. Negative for extremity weakness and headaches.  Hematological: Negative.   Psychiatric/Behavioral: The patient is not nervous/anxious.      PAST MEDICAL/SURGICAL HISTORY:  Past Medical History:  Diagnosis Date  . Diabetes mellitus type 2, diet-controlled (HCC)    diet controlled  . Hypercholesteremia   . Hypertension   . Hypothyroidism   . Iron deficiency anemia 10/12/2016  . Peritoneal carcinomatosis ([facility]) 10/11/2016   Past Surgical History:  Procedure Laterality Date  . ABDOMINAL HYSTERECTOMY    . CATARACT EXTRACTION W/PHACO  08/23/2011   Procedure: CATARACT EXTRACTION PHACO AND INTRAOCULAR LENS  PLACEMENT (IOC);  Surgeon: Tonny Branch, MD;  Location: [facility];  Service: Ophthalmology;  Laterality: Left;  CDE:15.60  . CATARACT EXTRACTION W/PHACO Right 08/16/2013   Procedure: CATARACT EXTRACTION PHACO  AND INTRAOCULAR LENS PLACEMENT (IOC);  Surgeon: Tonny Branch, MD;  Location: [facility];  Service: Ophthalmology;  Laterality: Right;  CDE:8.18  . PORTACATH PLACEMENT Left 11/01/2016   Procedure: INSERTION PORT-A-CATH LEFT SUBCLAVIAN;  Surgeon: Aviva Signs, MD;  Location: [facility];  Service: General;  Laterality: Left;     SOCIAL HISTORY:  Social History   Social History  . Marital status: Divorced    Spouse name: N/A  . Number of children: N/A  . Years of education: N/A   Occupational History  . Not on file.   Social History Main Topics  . Smoking status: Never Smoker  . Smokeless tobacco: Never Used  . Alcohol use No  . Drug use: No  . Sexual activity: Yes    Birth control/ protection: Surgical   Other Topics Concern  . Not on file   Social History Narrative  . No narrative on file    FAMILY HISTORY:  History reviewed. No pertinent family history.  CURRENT MEDICATIONS:  Outpatient Encounter Prescriptions as of 11/24/2016  Medication Sig Note  . aspirin EC 81 MG tablet Take 81 mg by mouth every morning.   . Cholecalciferol (VITAMIN D PO) Take 1 tablet by mouth daily.   . enalapril (VASOTEC) 5 MG tablet Take 5 mg by mouth every morning. 9/50/9326: Patient is uncertain if she is still taking this medication or not  . furosemide (LASIX) 40 MG tablet Take 1 tablet (40 mg total) by mouth daily as needed. (Patient taking differently: Take 40 mg by mouth daily as needed for fluid or edema. )   . levothyroxine (SYNTHROID, LEVOTHROID) 25 MCG tablet Take 25 mcg by mouth daily before breakfast.   . lidocaine-prilocaine (EMLA) cream Apply a quarter size amount to affected area 1 hour prior to coming to chemotherapy.   Marland Kitchen LORazepam (ATIVAN) 0.5 MG tablet Take 1 tablet (0.5 mg total) by mouth every 8 (eight) hours. (Patient taking differently: Take 0.5 mg by mouth every 8 (eight) hours as needed for anxiety. )   . metFORMIN (GLUCOPHAGE-XR) 500 MG 24 hr tablet Take 500 mg by mouth daily with  supper.    . naproxen sodium (ANAPROX) 220 MG tablet Take 220 mg by mouth daily as needed (pain).    Marland Kitchen omeprazole (PRILOSEC) 20 MG capsule Take 1 capsule (20 mg total) by mouth daily. 10/29/2016: Sometimes forgets to take.  . ondansetron (ZOFRAN ODT) 4 MG disintegrating tablet 4mg  ODT q4 hours prn nausea/vomit   . ondansetron (ZOFRAN-ODT) 8 MG disintegrating tablet TAKE 1 TABLET (8 MG TOTAL) BY MOUTH EVERY 8 (EIGHT) HOURS AS NEEDED FOR NAUSEA OR VOMITING.   . polyethylene glycol powder (GLYCOLAX/MIRALAX) powder Take 1 capful daily. (Patient taking differently: Take 17 g by mouth daily. Take 1 capful daily. 1 capful = 17g)   . potassium chloride 20 MEQ TBCR Take 10 mEq by mouth 2 (two) times daily. (Patient taking differently: Take 20 mEq by mouth 2 (two) times daily. ) 10/29/2016: Seems confused about this med.  . pravastatin (PRAVACHOL) 80 MG tablet Take 80 mg by mouth daily.    . prochlorperazine (COMPAZINE) 10 MG tablet Take 1 tablet (10 mg total) by mouth every 6 (six) hours as needed for nausea or vomiting.   Marland Kitchen spironolactone (ALDACTONE) 25 MG tablet Take 25 mg by mouth  2 (two) times daily. 10/29/2016: Forgets the second dose often.  . traMADol (ULTRAM) 50 MG tablet Take 1 tablet (50 mg total) by mouth every 6 (six) hours as needed. (Patient taking differently: Take 50 mg by mouth every 6 (six) hours as needed (pain). )   . valsartan (DIOVAN) 80 MG tablet Take 80 mg by mouth daily.    Facility-Administered Encounter Medications as of 11/24/2016  Medication  . 0.9 %  sodium chloride infusion  . CARBOplatin (PARAPLATIN) 350 mg in sodium chloride 0.9 % 250 mL chemo infusion  . dexamethasone (DECADRON) 20 mg in sodium chloride 0.9 % 50 mL IVPB  . diphenhydrAMINE (BENADRYL) injection 50 mg  . famotidine (PEPCID) IVPB 20 mg premix  . heparin lock flush 100 unit/mL  . PACLitaxel (TAXOL) 288 mg in dextrose 5 % 250 mL chemo infusion (> 80mg /m2)  . palonosetron (ALOXI) injection 0.25 mg    ALLERGIES:   No Known Allergies   PHYSICAL EXAM:  ECOG Performance status: 2-3 - Symptomatic; requires assistance  Vital signs blood pressure 111/65 pulse 80 respiration 18 temp 98.6 O2 sat 96%.   Physical Exam  Constitutional: She is oriented to person, place, and time.  Thin frail female in no acute distress   HENT:  Head: Normocephalic.  Mouth/Throat: Oropharynx is clear and moist.  Eyes: Conjunctivae are normal. No scleral icterus.  Neck: Normal range of motion. Neck supple.  Cardiovascular: Normal rate and regular rhythm.   Pulmonary/Chest: Effort normal and breath sounds normal. No respiratory distress.  Abdominal: Soft. Bowel sounds are normal. There is no tenderness.  Firm lower abdominal mass palpated.  Musculoskeletal: Normal range of motion. She exhibits no edema.  Lymphadenopathy:    She has no cervical adenopathy.  Neurological: She is alert and oriented to person, place, and time.  Skin: Skin is warm and dry. No rash noted.  Psychiatric: Judgment normal. Her mood appears anxious. She has a flat affect.  Appears slightly anxious; does not make eye contact often. Mildly flat affect.   Nursing note and vitals reviewed.    LABORATORY DATA:  I have reviewed the labs as listed.  CBC    Component Value Date/Time   WBC 8.8 11/24/2016 0951   RBC 3.86 (L) 11/24/2016 0951   HGB 9.9 (L) 11/24/2016 0951   HCT 31.9 (L) 11/24/2016 0951   PLT 335 11/24/2016 0951   MCV 82.6 11/24/2016 0951   MCH 25.6 (L) 11/24/2016 0951   MCHC 31.0 11/24/2016 0951   RDW 18.1 (H) 11/24/2016 0951   LYMPHSABS 1.8 11/24/2016 0951   MONOABS 0.8 11/24/2016 0951   EOSABS 1.7 (H) 11/24/2016 0951   BASOSABS 0.0 11/24/2016 0951   CMP Latest Ref Rng & Units 11/24/2016 11/10/2016 11/03/2016  Glucose 65 - 99 mg/dL 97 111(H) 94  BUN 6 - 20 mg/dL 9 13 8   Creatinine 0.44 - 1.00 mg/dL 0.72 0.70 0.59  Sodium 135 - 145 mmol/L 140 133(L) 138  Potassium 3.5 - 5.1 mmol/L 3.5 3.5 3.2(L)  Chloride 101 - 111 mmol/L  97(L) 92(L) 95(L)  CO2 22 - 32 mmol/L 34(H) 30 34(H)  Calcium 8.9 - 10.3 mg/dL 9.2 9.1 8.8(L)  Total Protein 6.5 - 8.1 g/dL 7.4 7.6 7.3  Total Bilirubin 0.3 - 1.2 mg/dL 0.4 0.7 0.7  Alkaline Phos 38 - 126 U/L 67 72 67  AST 15 - 41 U/L 23 24 24   ALT 14 - 54 U/L 16 21 18     PENDING LABS:    DIAGNOSTIC IMAGING:  *  The following radiologic images and reports have been reviewed independently and agree with below findings.  CT abd/pelvis: 10/07/16 CLINICAL DATA:  Abdominal pain  EXAM: CT ABDOMEN AND PELVIS WITH CONTRAST  TECHNIQUE: Multidetector CT imaging of the abdomen and pelvis was performed using the standard protocol following bolus administration of intravenous contrast.  CONTRAST:  110mL ISOVUE-300 IOPAMIDOL (ISOVUE-300) INJECTION 61%  COMPARISON:  None.  FINDINGS: Lower chest: No acute abnormality.  Hepatobiliary: No focal liver abnormality. The gallbladder is normal. No biliary dilatation.  Pancreas: There is a normal appearance of the pancreas.  Spleen: Spleen is unremarkable  Adrenals/Urinary Tract: The adrenal glands both appear normal. 1 cm cyst arises from the midpole of left kidney. Urinary bladder is not well seen.  Stomach/Bowel: Moderate size hiatal hernia. No pathologic dilatation of the large or small bowel loops.  Vascular/Lymphatic: Aortic atherosclerosis. No enlarged retroperitoneal lymph nodes. No pelvic or inguinal adenopathy.  Reproductive: The uterus is surgically absent. The adnexal structures are not well visualized.  Other: There is a marked volume of complex and loculated ascites within the abdomen and pelvis. There is evidence of extensive peritoneal carcinomatosis with omental cake measuring 14.1 x 2.5 by 6.9 cm. There are numerous calcified peritoneal deposits identified predominantly involving the dependent portions of the peritoneal cavity.  Musculoskeletal: No acute or significant osseous  findings.  IMPRESSION: 1. There is evidence of extensive peritoneal carcinomatosis including large volume of loculated ascites, omental caking, and calcified peritoneal deposits. The patient is status post hysterectomy. If the patient still has ovaries then this would be a likely origin. Alternatively GI malignancies may present with peritoneal carcinomatosis. Oncologic consultation is advised. 2. At this time there is no evidence for high-grade bowel obstruction or obstructive uropathy.   Electronically Signed   By: Kerby Moors M.D.   On: 10/07/2016 16:35   CT chest: 10/18/16 CLINICAL DATA:  Peritoneal carcinomatosis.  EXAM: CT CHEST WITHOUT CONTRAST  TECHNIQUE: Multidetector CT imaging of the chest was performed following the standard protocol without IV contrast.  COMPARISON:  Abdomen pelvis CT from 10/07/2016  FINDINGS: Cardiovascular: The heart is enlarged. No substantial pericardial effusion. Coronary artery calcification is noted. Atherosclerotic calcification is noted in the wall of the thoracic aorta.  Mediastinum/Nodes: No mediastinal lymphadenopathy. Index precarinal lymph node is 9 mm in short axis. No evidence for gross hilar lymphadenopathy although assessment is limited by the lack of intravenous contrast on today's study. Tiny hiatal hernia. Esophagus otherwise unremarkable. There is no axillary lymphadenopathy.  Lungs/Pleura: Subpleural atelectasis noted in the lung bases. No suspicious pulmonary nodule or mass. No focal airspace consolidation. No pulmonary edema. Tiny left pleural effusion noted.  Upper Abdomen: Intraperitoneal fluid again evident, better documented on the recent abdomen and pelvis CT.  Musculoskeletal: Bone windows reveal no worrisome lytic or sclerotic osseous lesions.  IMPRESSION: 1. Tiny left pleural effusion with basilar atelectasis in both lungs. No suspicious pulmonary nodule or mass. 2.  Emphysema.  (ICD10-J43.9) 3.  Aortic Atherosclerois (ICD10-170.0)   Electronically Signed   By: Misty Stanley M.D.   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 Evaluated by Gyn-Onc (Dr. Denman George) who felt that patient was not a candidate for upfront debulking surgery d/t poor performance status and protein/calorie malnutrition. Dr. Denman George has recommended Carbo/Taxol x 3 cycles, followed by restaging imaging and consideration for  debulking surgery about 3 weeks after cycle #3. She may require additional chemotherapy in the adjuvant setting as well.  -Labs reviewed. Proceed with cycle 2 of chemotherapy. - As per Dr. Serita Grit recommendations. We will plan to proceed with neoadjuvant chemotherapy with 3 cycles of carboplatin and paclitaxel followed by repeat imaging and interval debulking 3 weeks after cycle 3 should she demonstrate good response to therapy and improved underlying health.  Weight loss:  -Nutrition consult was placed and she will be meeting with them today. -Encouraged her to increase oral intake and maintain hydration.   Dispo:  -Return to cancer center as scheduled for cycle #3 chemotherapy. Return to clinic in 3 weeks for follow-up. Plan to repeat her restaging scans once she completes cycle 3 of chemotherapy.   All questions were answered to patient's stated satisfaction. Encouraged patient to call with any new concerns or questions before her next visit to the cancer center and we can certain see her sooner, if needed.    Twana First M.D.

## 2016-11-24 NOTE — Progress Notes (Signed)
Tolerated infusion w/o adverse reaction.  Alert, in no distress.  VSS. Discharged via wheelchair in c/o daughter for transport home.

## 2016-11-24 NOTE — Patient Instructions (Addendum)
Sherry Hopkins at [facility] Discharge Instructions  RECOMMENDATIONS MADE BY THE CONSULTANT AND ANY TEST RESULTS WILL BE SENT TO YOUR REFERRING PHYSICIAN.  You were seen today by Dr. Twana First You will get treatment today Follow up in 3 weeks with next treatment and visit with physician.   Thank you for choosing [place] at [facility] to provide your oncology and hematology care.  To afford each patient quality time with our provider, please arrive at least 15 minutes before your scheduled appointment time.    If you have a lab appointment with the [facility] please come in thru the  Main Entrance and check in at the main information desk  You need to re-schedule your appointment should you arrive 10 or more minutes late.  We strive to give you quality time with our providers, and arriving late affects you and other patients whose appointments are after yours.  Also, if you no show three or more times for appointments you may be dismissed from the clinic at the providers discretion.     Again, thank you for choosing [facility].  Our hope is that these requests will decrease the amount of time that you wait before being seen by our physicians.       _____________________________________________________________  Should you have questions after your visit to [facility], please contact our office at [phone] between the hours of 8:30 a.m. and 4:30 p.m.  Voicemails left after 4:30 p.m. will not be returned until the following business day.  For prescription refill requests, have your pharmacy contact our office.       Resources For Cancer Patients and their Caregivers ? American Cancer Society: Can assist with transportation, wigs, general needs, runs Look Good Feel Better.        959-752-7805 ? Cancer Care: Provides financial assistance, online support groups, medication/co-pay assistance.   1-800-813-HOPE 281-689-7790) ? [facility] Assists [place] cancer patients and their families through emotional , educational and financial support.  701 690 8958 ? [place] DSS Where to apply for food stamps, Medicaid and utility assistance. (934)247-2673 ? [facility]: Transportation to medical appointments. 540-062-5535 ? Social Security Administration: May apply for disability if have a Stage IV cancer. 845 875 9229 610-431-8765 ? [employer], Disability and Transit Services: Assists with nutrition, care and transit needs. [place] Support Programs: @10RELATIVEDAYS @ > Cancer Support Group  2nd Tuesday of the month 1pm-2pm, Journey Room  > Creative Journey  3rd Tuesday of the month 1130am-1pm, Journey Room  > Look Good Feel Better  1st Wednesday of the month 10am-12 noon, Journey Room (Call [place] to register [phone])

## 2016-11-30 ENCOUNTER — Encounter (HOSPITAL_BASED_OUTPATIENT_CLINIC_OR_DEPARTMENT_OTHER): Payer: Medicare Other

## 2016-11-30 ENCOUNTER — Encounter (HOSPITAL_COMMUNITY): Payer: Medicare Other | Admitting: Dietician

## 2016-11-30 ENCOUNTER — Encounter (HOSPITAL_COMMUNITY): Payer: Self-pay

## 2016-11-30 VITALS — BP 111/62 | HR 84 | Temp 98.2°F | Resp 18

## 2016-11-30 DIAGNOSIS — Z95828 Presence of other vascular implants and grafts: Secondary | ICD-10-CM

## 2016-11-30 DIAGNOSIS — E860 Dehydration: Secondary | ICD-10-CM

## 2016-11-30 DIAGNOSIS — C786 Secondary malignant neoplasm of retroperitoneum and peritoneum: Secondary | ICD-10-CM

## 2016-11-30 MED ORDER — SODIUM CHLORIDE 0.9 % IV SOLN
INTRAVENOUS | Status: DC
  Administered 2016-11-30: 11:00:00 via INTRAVENOUS

## 2016-11-30 MED ORDER — SODIUM CHLORIDE 0.9% FLUSH
10.0000 mL | INTRAVENOUS | Status: DC | PRN
  Administered 2016-11-30: 10 mL via INTRAVENOUS
  Filled 2016-11-30: qty 10

## 2016-11-30 MED ORDER — HEPARIN SOD (PORK) LOCK FLUSH 100 UNIT/ML IV SOLN
500.0000 [IU] | Freq: Once | INTRAVENOUS | Status: AC
  Administered 2016-11-30: 500 [IU] via INTRAVENOUS

## 2016-11-30 NOTE — Progress Notes (Signed)
Nutrition Assessment  Reason for Assessment: Nutritional referral by MD/NP  ASSESSMENT: 76 y.o.female PMHx HTN, DM2, GERD, protein calorie malnutrition. She had developed abdominal pain in May of this year. Workup at that time was unremarkable. She was prescribed acid reflux medication which controlled symptoms. Once medication ran out, reexperienced severe pain w/ emesis and presented to the ED at the end of July. Imaging at that time revealed extensive peritoneal carcinomatosis including large volume of loculated ascites, omental caking, and calcified peritoneal deposits. She has had multiple paracenteses. Thought she has stage IIIC ovarian cancer versus primary peritoneal cancer.  She is currently receiving neoadjuvant chemotherapy with plan then to undergo debulking surgery. She was thought too malnourished to undergo surgery upfront.   Today, patient presents with her daughter, approximately 25-30 minutes late. Per chart review, she has been notably late for other appointments. Noted she has had 2 paracenteses, one of 2 Liter 7/30 and one of 4.2 Liters 8/16. This would equate to roughly 13.5 lbs.   Patient reports that she is "woozy" and has a "staggering gait". She displays confusion. Has not had bowel movement in 2 days. Today, she shows further wt loss. Is 112.5 lbs. She has lost 3.5 lbs in the last week. Her mouth "is always dry"  Took 24 hr diet recall. Of note, the patient would mention amounts of items that the daughter reported as outright false. The daughter says the patient report is wrong and she uses measuring cups and is positive patient is drastically over exaggerating. What follows is daughter report of the patients intake  Breakfast: 2-3 bites of English muffin w/ butter and apple butter. "Sips" of Ginger ale.  Lunch:4-6 oz of Broccoli and cheese soup Dinner: 1 ensure (high protein)   Wt Readings from Last 10 Encounters:  11/30/16 112 lb 8 oz (51 kg)  11/24/16 115 lb 1.6  oz (52.2 kg)  11/10/16 120 lb 8 oz (54.7 kg)  11/08/16 119 lb 11.2 oz (54.3 kg)  11/06/16 129 lb (58.5 kg)  11/03/16 129 lb (58.5 kg)  10/28/16 132 lb (59.9 kg)  10/28/16 132 lb (59.9 kg)  10/28/16 130 lb 4.8 oz (59.1 kg)  10/20/16 139 lb (63 kg)   Nutrition Focused Physical Exam: Mod muscle/ severe fat wasting  Medications: Ultram, Spironolactone, Compazine, Zofran, KCL, Miralax, Zofran, H2RA, PRN lasix, D3  Labs: None today.   Anthropometrics:  Height: 5'5"  Weight: 112.5 lbs Body mass index is 18.72 kg/m.  Estimated Energy Needs Kcals: 1800-2050 kcals (35-40 kcal/kg bw) Protein: 75-90g Pro (1.5-1.8 g/kg bw) Fluid: >1.5 L fluid (30 ml/kg bw)  NUTRITION DIAGNOSIS: Inadequate Oral intake related to depression vs other psychiatric problems (?), poor appetite, nausea, food aversion AEB the patients diet recall (see assessment)  MALNUTRITION DIAGNOSIS: Severe malnutrition, Chronic. Criteria are weight loss, estimated intake that has met </= 75% of needs for >/= 1 month, severe fat loss, wt loss (even after accounting for LVPs)  INTERVENTION:   Based on weight loss, patients report of being woozy/staggering, dry mouth, minimal fluid intake (est <20 oz in 24 hrs) and confusion, RD was very concerned the patient was clinically dehydrated. RD conveyed findings to MD/NP. She subsequently was ordered IVF, 1 Liter over 2 hrs. Additionally, the patient now has scheduled IVF infusions on MWF  From discussion with MD/NP, the patient sounds to be quickly declining. She has ongoing weight loss and seems to be unable to help herself despite repeated counseling. She has met with chaplain due to her  anxiety.   RD had frank conversation with patient about her continued to decline. It is re-emphasized to patient that if her nutritional status continues to decline, she would no longer be a candidate for treatment.   RD attempted to find out why continues to struggle so much with eating and  drinking.  She sounds to have a complete aversion to food. She "doesnt want to even look at food". She reports having pain in abdomen after eating. RD asked if her current pain medication was not controlling this. She states that she doesn't think she is taking any medications. Her daughter, showing concern, asks why she doesn't remember the medications she had just given her. Patient displayed memory issues throughout encounter.  Specifically, RD explained that she is drastically under consuming fluids. She says that "I thought I was not supposed to drink much fluids". Her daughter believes this came from the ED physician telling her that water could "increase bubbles in her stomach and worsen her nausea". RD informed the patient that she IS supposed to be drinking a large amount of fluids.   RD provided handouts titled "Increasing Calories and Protein" and "Dehydration". Reviewed handouts in depth. Emphasized the symptoms of dehydration and tips to prevent it. Advocated for eating foods high in water. She is given a goal of at least 1.5 L of fluid per day, based off of 30 ml/kg bw. Told to drink 6/5 cups, 50 oz.  Went over protein sources and how she can increase the calorie content of her meals. Advocated for liberal use of toppings/condiments.   She needs to eat more frequently. Given she can only tolerate a small amount at a time, she will need to be grazing throughout the entire day. Advocated for supplements. She can sip on these throughout day.   RD provided patient with a large number of samples as there were cases available. Will order one that the daughter says she will pick up tomorrow. This is her first case.    Patients reports understanding, though, given her confusion and continued decline refractory to interventions from other providers, RD remains highly concerned she will continual to decline  MONITORING, EVALUATION, GOAL: Weight, reported intake, goals of care  NEXT VISIT: 9/28 w/  Friday RD  Nathan Franks RD, LDN, CNSC Clinical Nutrition Pager: [phone] 11/30/2016 12:37 PM     

## 2016-11-30 NOTE — Progress Notes (Signed)
Patient here today to see Ovid Curd, Dietitian.  Per Ovid Curd, patient is not eating or drinking much at home.  She continues to lose weight.   I briefly saw her for a few moments today. She is here today with her daughter.  Encouraged her to eat and drink as she is able, with adjustments/recommendations from nutrition.  Shared my concerns with her regarding her progressive weight loss and dehydration and how these things impact her ability to continue chemotherapy.    I will talk to nursing to see if they can help [place] get Luz Brazen, Chaplain to come back and spend some time with the patient at some point in the near future. Patient expressed that her recent visit with Mardene Celeste was helpful.     Discussed with Dr. Talbert Cage.  Will make arrangements for 1L NS IVF today and Thursday of this week. Then will have her come in for IVF (1L over 2 hours) on Monday, Wednesday, and Friday for at least the next 2 weeks. Patient/daughter concerned about transportation. Patient lives in [place]; I will ask our patient advocate for help with transportation assistance for patient.   Patient/family agrees with this plan.    Mike Craze, NP [place] [phone]

## 2016-11-30 NOTE — Patient Instructions (Signed)
[place] at [facility] Discharge Instructions  RECOMMENDATIONS MADE BY THE CONSULTANT AND ANY TEST RESULTS WILL BE SENT TO YOUR REFERRING PHYSICIAN.  You got one liter of IV fluids today. You will get fluids again on Thursday this week.  We will schedule you for fluids on Monday, Wednesday, and Friday next week.  Follow up at scheduled times.  Thank you for choosing [place] at [facility] to provide your oncology and hematology care.  To afford each patient quality time with our provider, please arrive at least 15 minutes before your scheduled appointment time.    If you have a lab appointment with the [place] please come in thru the  Main Entrance and check in at the main information desk  You need to re-schedule your appointment should you arrive 10 or more minutes late.  We strive to give you quality time with our providers, and arriving late affects you and other patients whose appointments are after yours.  Also, if you no show three or more times for appointments you may be dismissed from the clinic at the providers discretion.     Again, thank you for choosing [facility].  Our hope is that these requests will decrease the amount of time that you wait before being seen by our physicians.       _____________________________________________________________  Should you have questions after your visit to [facility], please contact our office at [phone] between the hours of 8:30 a.m. and 4:30 p.m.  Voicemails left after 4:30 p.m. will not be returned until the following business day.  For prescription refill requests, have your pharmacy contact our office.       Resources For Cancer Patients and their Caregivers ? American Cancer Society: Can assist with transportation, wigs, general needs, runs Look Good Feel Better.        503-822-7091 ? Cancer Care: Provides financial assistance, online  support groups, medication/co-pay assistance.  1-800-813-HOPE 802-782-7077) ? [place] Assists [place] cancer patients and their families through emotional , educational and financial support.  (712)541-3633 ? [place] DSS Where to apply for food stamps, Medicaid and utility assistance. 404-649-1267 ? RCATS: Transportation to medical appointments. (814)587-6239 ? Social Security Administration: May apply for disability if have a Stage IV cancer. 365-042-6587 4011777343 ? [employer], Disability and Transit Services: Assists with nutrition, care and transit needs. [place] Support Programs: @10RELATIVEDAYS @ > Cancer Support Group  2nd Tuesday of the month 1pm-2pm, Journey Room  > Creative Journey  3rd Tuesday of the month 1130am-1pm, Journey Room  > Look Good Feel Better  1st Wednesday of the month 10am-12 noon, Journey Room (Call [place] to register [phone])

## 2016-11-30 NOTE — Progress Notes (Signed)
Pt tolerated IV fluids today without incidence. Patient to follow up on Thursday for fluids. Family aware of appointment time. Patient discharged ambulatory and in stable condition from clinic with daughter.

## 2016-12-01 ENCOUNTER — Encounter: Payer: Self-pay | Admitting: *Deleted

## 2016-12-01 NOTE — Progress Notes (Signed)
[place] Clinical Social Work  Clinical Social Work was referred by Mike Craze, NP, for transportation concerns.  Patient lives in [place] and has difficulty with transportation for three days a week fluids. CSW attempted to leave voicemail with patient's daughter, voicemail box was full.  Clinical Social Worker contacted patient by phone to offer support and assess for needs.  Ms. Baglio shared she believes she met with [facility] yesterday to sign up for services.  Patient plans to utilize friends/family and [employer] transportation.  CSW requested patient share with her daughter that CSW called and to follow up.   Maryjean Morn, MSW, LCSW, OSW-C Clinical Social Worker [facility] [phone]

## 2016-12-02 ENCOUNTER — Encounter (HOSPITAL_COMMUNITY): Payer: Self-pay

## 2016-12-02 ENCOUNTER — Encounter (HOSPITAL_BASED_OUTPATIENT_CLINIC_OR_DEPARTMENT_OTHER): Payer: Medicare Other

## 2016-12-02 VITALS — BP 125/71 | HR 82 | Temp 97.7°F | Resp 18 | Wt 113.0 lb

## 2016-12-02 DIAGNOSIS — C481 Malignant neoplasm of specified parts of peritoneum: Secondary | ICD-10-CM

## 2016-12-02 DIAGNOSIS — E860 Dehydration: Secondary | ICD-10-CM | POA: Diagnosis not present

## 2016-12-02 DIAGNOSIS — C786 Secondary malignant neoplasm of retroperitoneum and peritoneum: Secondary | ICD-10-CM

## 2016-12-02 MED ORDER — SODIUM CHLORIDE 0.9 % IV SOLN
INTRAVENOUS | Status: DC
  Administered 2016-12-02: 10:00:00 via INTRAVENOUS

## 2016-12-02 MED ORDER — SODIUM CHLORIDE 0.9% FLUSH
10.0000 mL | INTRAVENOUS | Status: DC | PRN
  Administered 2016-12-02: 10 mL via INTRAVENOUS
  Filled 2016-12-02: qty 10

## 2016-12-02 MED ORDER — HEPARIN SOD (PORK) LOCK FLUSH 100 UNIT/ML IV SOLN
500.0000 [IU] | Freq: Once | INTRAVENOUS | Status: AC
  Administered 2016-12-02: 500 [IU] via INTRAVENOUS

## 2016-12-02 NOTE — Patient Instructions (Signed)
[place] at [facility] Discharge Instructions  RECOMMENDATIONS MADE BY THE CONSULTANT AND ANY TEST RESULTS WILL BE SENT TO YOUR REFERRING PHYSICIAN.  Pt received 1 Liter of NS for hydration over 2 hrs today. Follow-up as scheduled. Call clinic for any questions or concerns  Thank you for choosing [place] at [facility] to provide your oncology and hematology care.  To afford each patient quality time with our provider, please arrive at least 15 minutes before your scheduled appointment time.    If you have a lab appointment with the [place] please come in thru the  Main Entrance and check in at the main information desk  You need to re-schedule your appointment should you arrive 10 or more minutes late.  We strive to give you quality time with our providers, and arriving late affects you and other patients whose appointments are after yours.  Also, if you no show three or more times for appointments you may be dismissed from the clinic at the providers discretion.     Again, thank you for choosing [facility].  Our hope is that these requests will decrease the amount of time that you wait before being seen by our physicians.       _____________________________________________________________  Should you have questions after your visit to [facility], please contact our office at [phone] between the hours of 8:30 a.m. and 4:30 p.m.  Voicemails left after 4:30 p.m. will not be returned until the following business day.  For prescription refill requests, have your pharmacy contact our office.       Resources For Cancer Patients and their Caregivers ? American Cancer Society: Can assist with transportation, wigs, general needs, runs Look Good Feel Better.        907-122-2986 ? Cancer Care: Provides financial assistance, online support groups, medication/co-pay assistance.  1-800-813-HOPE  (580)754-6343) ? [place] Assists [place] cancer patients and their families through emotional , educational and financial support.  647-497-4166 ? [place] DSS Where to apply for food stamps, Medicaid and utility assistance. 904-351-5024 ? RCATS: Transportation to medical appointments. 306-684-6109 ? Social Security Administration: May apply for disability if have a Stage IV cancer. 605-351-0228 5160851167 ? [employer], Disability and Transit Services: Assists with nutrition, care and transit needs. [place] Support Programs: @10RELATIVEDAYS @ > Cancer Support Group  2nd Tuesday of the month 1pm-2pm, Journey Room  > Creative Journey  3rd Tuesday of the month 1130am-1pm, Journey Room  > Look Good Feel Better  1st Wednesday of the month 10am-12 noon, Journey Room (Call [place] to register [phone])

## 2016-12-02 NOTE — Progress Notes (Signed)
Sherry Hopkins tolerated hydration with NS over 2 hrs well without complaints or incident. VSS upon discharge. Pt discharged self ambulatory in satisfactory condition accompanied by a friend

## 2016-12-03 ENCOUNTER — Telehealth: Payer: Self-pay | Admitting: *Deleted

## 2016-12-03 NOTE — Telephone Encounter (Signed)
Contacted the patient's daughter and gave follow up appt for October 15th at 12pm.

## 2016-12-06 ENCOUNTER — Encounter (HOSPITAL_COMMUNITY): Payer: Self-pay

## 2016-12-06 ENCOUNTER — Encounter (HOSPITAL_BASED_OUTPATIENT_CLINIC_OR_DEPARTMENT_OTHER): Payer: Medicare Other

## 2016-12-06 VITALS — BP 116/63 | HR 68 | Temp 97.6°F | Resp 19 | Wt 111.0 lb

## 2016-12-06 DIAGNOSIS — C801 Malignant (primary) neoplasm, unspecified: Principal | ICD-10-CM

## 2016-12-06 DIAGNOSIS — C786 Secondary malignant neoplasm of retroperitoneum and peritoneum: Secondary | ICD-10-CM

## 2016-12-06 DIAGNOSIS — C481 Malignant neoplasm of specified parts of peritoneum: Secondary | ICD-10-CM

## 2016-12-06 DIAGNOSIS — R112 Nausea with vomiting, unspecified: Secondary | ICD-10-CM

## 2016-12-06 DIAGNOSIS — E43 Unspecified severe protein-calorie malnutrition: Secondary | ICD-10-CM

## 2016-12-06 MED ORDER — HEPARIN SOD (PORK) LOCK FLUSH 100 UNIT/ML IV SOLN
500.0000 [IU] | Freq: Once | INTRAVENOUS | Status: AC
  Administered 2016-12-06: 500 [IU] via INTRAVENOUS
  Filled 2016-12-06: qty 5

## 2016-12-06 MED ORDER — SODIUM CHLORIDE 0.9 % IV SOLN
INTRAVENOUS | Status: DC
  Administered 2016-12-06: 1000 mL via INTRAVENOUS

## 2016-12-06 MED ORDER — SODIUM CHLORIDE 0.9% FLUSH
10.0000 mL | Freq: Once | INTRAVENOUS | Status: AC
Start: 2016-12-06 — End: 2016-12-06
  Administered 2016-12-06: 10 mL via INTRAVENOUS

## 2016-12-06 NOTE — Patient Instructions (Signed)
[place] at [facility]  Discharge Instructions:  You received hydration today.  Keep all scheduled appointments and call for any questions or concerns.  _______________________________________________________________  Thank you for choosing [place] at [facility] to provide your oncology and hematology care.  To afford each patient quality time with our providers, please arrive at least 15 minutes before your scheduled appointment.  You need to re-schedule your appointment if you arrive 10 or more minutes late.  We strive to give you quality time with our providers, and arriving late affects you and other patients whose appointments are after yours.  Also, if you no show three or more times for appointments you may be dismissed from the clinic.  Again, thank you for choosing [place] at [place] hope is that these requests will allow you access to exceptional care and in a timely manner. _______________________________________________________________  If you have questions after your visit, please contact our office at [phone] between the hours of 8:30 a.m. and 5:00 p.m. Voicemails left after 4:30 p.m. will not be returned until the following business day. _______________________________________________________________  For prescription refill requests, have your pharmacy contact our office. _______________________________________________________________  Recommendations made by the consultant and any test results will be sent to your referring physician. _______________________________________________________________

## 2016-12-06 NOTE — Progress Notes (Signed)
To treatment area for hydration per last note by practitioner on November 30, 2016.  Patient stated fair appetite and energy level.  No complaints of pain.  Friend at side.  No s/s of distress.    Patient tolerated hydration with no complaints voiced.  Port site clean and dry with no bruising or swelling noted at site.  Band aid applied.  VSS stable with discharge and left ambulatory with friend.  No s/s of distress with discharge.  Patient ate lunch with no complaints of pain.

## 2016-12-08 ENCOUNTER — Encounter (HOSPITAL_BASED_OUTPATIENT_CLINIC_OR_DEPARTMENT_OTHER): Payer: Medicare Other

## 2016-12-08 ENCOUNTER — Encounter (HOSPITAL_COMMUNITY): Payer: Self-pay

## 2016-12-08 ENCOUNTER — Ambulatory Visit (HOSPITAL_COMMUNITY): Payer: Medicare Other

## 2016-12-08 ENCOUNTER — Ambulatory Visit (HOSPITAL_COMMUNITY): Payer: Medicare Other | Admitting: Adult Health

## 2016-12-08 ENCOUNTER — Other Ambulatory Visit (HOSPITAL_COMMUNITY): Payer: Medicare Other

## 2016-12-08 VITALS — BP 122/72 | HR 80 | Temp 98.2°F | Resp 16 | Wt 114.4 lb

## 2016-12-08 DIAGNOSIS — C786 Secondary malignant neoplasm of retroperitoneum and peritoneum: Secondary | ICD-10-CM

## 2016-12-08 DIAGNOSIS — C801 Malignant (primary) neoplasm, unspecified: Principal | ICD-10-CM

## 2016-12-08 DIAGNOSIS — R112 Nausea with vomiting, unspecified: Secondary | ICD-10-CM

## 2016-12-08 DIAGNOSIS — C481 Malignant neoplasm of specified parts of peritoneum: Secondary | ICD-10-CM

## 2016-12-08 MED ORDER — HEPARIN SOD (PORK) LOCK FLUSH 100 UNIT/ML IV SOLN
500.0000 [IU] | Freq: Once | INTRAVENOUS | Status: AC
  Administered 2016-12-08: 500 [IU] via INTRAVENOUS

## 2016-12-08 MED ORDER — SODIUM CHLORIDE 0.9 % IV SOLN
INTRAVENOUS | Status: DC
  Administered 2016-12-08: 11:00:00 via INTRAVENOUS

## 2016-12-08 NOTE — Patient Instructions (Signed)
[facility] at [facility] Discharge Instructions  RECOMMENDATIONS MADE BY THE CONSULTANT AND ANY TEST RESULTS WILL BE SENT TO YOUR REFERRING PHYSICIAN.  One liter of fluids today Follow up as scheduled.  Thank you for choosing [facility] at [facility] to provide your oncology and hematology care.  To afford each patient quality time with our provider, please arrive at least 15 minutes before your scheduled appointment time.    If you have a lab appointment with the Cancer Center please come in thru the  Main Entrance and check in at the main information desk  You need to re-schedule your appointment should you arrive 10 or more minutes late.  We strive to give you quality time with our providers, and arriving late affects you and other patients whose appointments are after yours.  Also, if you no show three or more times for appointments you may be dismissed from the clinic at the providers discretion.     Again, thank you for choosing [facility].  Our hope is that these requests will decrease the amount of time that you wait before being seen by our physicians.       _____________________________________________________________  Should you have questions after your visit to [facility], please contact our office at [phone] between the hours of 8:30 a.m. and 4:30 p.m.  Voicemails left after 4:30 p.m. will not be returned until the following business day.  For prescription refill requests, have your pharmacy contact our office.       Resources For Cancer Patients and their Caregivers ? American Cancer Society: Can assist with transportation, wigs, general needs, runs Look Good Feel Better.        1-888-227-6333 ? Cancer Care: Provides financial assistance, online support groups, medication/co-pay assistance.  1-800-813-HOPE (4673) ? [facility] Assists [place] cancer patients  and their families through emotional , educational and financial support.  336-427-4357 ? [place] DSS Where to apply for food stamps, Medicaid and utility assistance. 336-342-1394 ? RCATS: Transportation to medical appointments. 336-347-2287 ? Social Security Administration: May apply for disability if have a Stage IV cancer. 336-342-7796 1-800-772-1213 ? [place] Aging, Disability and Transit Services: Assists with nutrition, care and transit needs. 336-349-2343  Cancer Center Support Programs: @10RELATIVEDAYS@ > Cancer Support Group  2nd Tuesday of the month 1pm-2pm, Journey Room  > Creative Journey  3rd Tuesday of the month 1130am-1pm, Journey Room  > Look Good Feel Better  1st Wednesday of the month 10am-12 noon, Journey Room (Call [employer] to register [phone])   

## 2016-12-08 NOTE — Progress Notes (Signed)
One liter of fluids given today per orders.  Treatment given per orders. Patient tolerated it well without problems. Vitals stable and discharged home from clinic ambulatory. Follow up as scheduled.  

## 2016-12-10 ENCOUNTER — Encounter (HOSPITAL_COMMUNITY): Payer: Medicare Other

## 2016-12-10 ENCOUNTER — Encounter (HOSPITAL_BASED_OUTPATIENT_CLINIC_OR_DEPARTMENT_OTHER): Payer: Medicare Other

## 2016-12-10 ENCOUNTER — Encounter (HOSPITAL_COMMUNITY): Payer: Self-pay

## 2016-12-10 VITALS — BP 130/65 | HR 69 | Temp 97.6°F | Resp 16 | Wt 117.6 lb

## 2016-12-10 DIAGNOSIS — R112 Nausea with vomiting, unspecified: Secondary | ICD-10-CM

## 2016-12-10 DIAGNOSIS — C786 Secondary malignant neoplasm of retroperitoneum and peritoneum: Secondary | ICD-10-CM | POA: Diagnosis not present

## 2016-12-10 DIAGNOSIS — E43 Unspecified severe protein-calorie malnutrition: Secondary | ICD-10-CM

## 2016-12-10 DIAGNOSIS — C801 Malignant (primary) neoplasm, unspecified: Principal | ICD-10-CM

## 2016-12-10 MED ORDER — SODIUM CHLORIDE 0.9 % IV SOLN
INTRAVENOUS | Status: DC
Start: 2016-12-10 — End: 2016-12-10
  Administered 2016-12-10: 10:00:00 via INTRAVENOUS

## 2016-12-10 MED ORDER — HEPARIN SOD (PORK) LOCK FLUSH 100 UNIT/ML IV SOLN
500.0000 [IU] | Freq: Once | INTRAVENOUS | Status: AC
  Administered 2016-12-10: 500 [IU] via INTRAVENOUS

## 2016-12-10 MED ORDER — SODIUM CHLORIDE 0.9% FLUSH
10.0000 mL | Freq: Once | INTRAVENOUS | Status: AC
Start: 2016-12-10 — End: 2016-12-10
  Administered 2016-12-10: 10 mL via INTRAVENOUS

## 2016-12-10 MED ORDER — HEPARIN SOD (PORK) LOCK FLUSH 100 UNIT/ML IV SOLN
INTRAVENOUS | Status: AC
  Filled 2016-12-10: qty 5

## 2016-12-10 NOTE — Progress Notes (Signed)
Nutrition Follow-up:   Met with patient prior to IV fluid infusion.  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  Patient came to clinic alone and brought by [facility] van.   Patient reports that she feels better. "I don't know if its the fluids or what."  Reports that she did not eat breakfast this am because she didn't realize she had to catch the Lucianne Lei so early.  Reports yesterday she ate couple of peaches, 100% of ham and cheese sandwich for lunch and 100% of egg biscuit family member brought her from [employer].  Reports that she has been drinking 2-3 ensure plus per day but just started that recently.  Reports that she is drinking what appears to be a 3, 8oz glasses of gatorade per day (by what she holds up with her hands).  Reports that she is drinking juice and water(no more than 16 oz bottle/dailly, visual provided) as well. Reports that her daughter bought her klondike bars and it was 6 in the pack and she has 2 left. Patient reports that she prepares meals at home but daughter and son help buy groceries. Daughter not present to confirm intake.    Reports no BM in 4-5 days, reports took miralax this am, unclear if she has been taking miralax for several days prior to today as well.    Reports 1 episode of nausea but took zofran  Medications: reviewed  Labs: reviewed  Anthropometrics:   Weight increased to 114 lb 6.4 oz on 9/26 from 112 lb 8 oz on 9/18  NUTRITION DIAGNOSIS: Inadequate oral intake improved per patient report   MALNUTRITION DIAGNOSIS: Severe malnutrition continues   INTERVENTION:   Reviewed ways to increase calories and protein Encouraged small frequent meals, setting a alarm to remind patient to eat every 2-3 hours.   Encouraged continued intake oral nutrition supplements Reviewed hydration and goals for intake daily with patient. Discussed lack of bowel movement with RN, Horris Latino.      MONITORING, EVALUATION, GOAL: weight trends,  intake   NEXT VISIT: October 5 during infusion  Adisynn Suleiman B. Zenia Resides, [place], [place] Registered Dietitian [phone] (pager)

## 2016-12-10 NOTE — Patient Instructions (Signed)
[place] at [facility]  Discharge Instructions:  Hydration today.  Call for any questions or concerns.   _______________________________________________________________  Thank you for choosing [place] at [facility] to provide your oncology and hematology care.  To afford each patient quality time with our providers, please arrive at least 15 minutes before your scheduled appointment.  You need to re-schedule your appointment if you arrive 10 or more minutes late.  We strive to give you quality time with our providers, and arriving late affects you and other patients whose appointments are after yours.  Also, if you no show three or more times for appointments you may be dismissed from the clinic.  Again, thank you for choosing [place] at [place] hope is that these requests will allow you access to exceptional care and in a timely manner. _______________________________________________________________  If you have questions after your visit, please contact our office at [phone] between the hours of 8:30 a.m. and 5:00 p.m. Voicemails left after 4:30 p.m. will not be returned until the following business day. _______________________________________________________________  For prescription refill requests, have your pharmacy contact our office. _______________________________________________________________  Recommendations made by the consultant and any test results will be sent to your referring physician. _______________________________________________________________

## 2016-12-10 NOTE — Progress Notes (Signed)
Patient to treatment area for hydration per note.  Patient stated good appetite but constipation prn.  Per flowsheet last bowel movement 12/03/16 but when talking with the patient she has difficulty remembering her last bowel movement. Patient stated she is passing gas and "hears" her stomach growling and when she has a bowel movement it is soft.  Reviewed miralax and constipation management and given hand out with understanding verbalized.  Patient was seen by the dietitian today.  Instructed the patient to call on Monday to let us know if she has a bowel movement over the weekend.  Abdomen soft and bowel sounds noted.  Patient denied sharp, piercing, or abdominal bloating.  Reviewed with the patient when to go to the emergency room with understanding verbalized.   Patient tolerated hydration with no complaints voiced.  Port site clean and dry with no bruising or swelling noted.  Band aid applied.  VSS with discharge and left with no s/s of distress noted.

## 2016-12-13 ENCOUNTER — Encounter (HOSPITAL_COMMUNITY): Payer: Self-pay

## 2016-12-13 ENCOUNTER — Encounter (HOSPITAL_COMMUNITY): Payer: Medicare Other | Attending: Oncology

## 2016-12-13 VITALS — BP 122/66 | HR 64 | Temp 97.7°F | Resp 18 | Wt 118.4 lb

## 2016-12-13 DIAGNOSIS — C786 Secondary malignant neoplasm of retroperitoneum and peritoneum: Secondary | ICD-10-CM | POA: Diagnosis not present

## 2016-12-13 DIAGNOSIS — R300 Dysuria: Secondary | ICD-10-CM | POA: Insufficient documentation

## 2016-12-13 DIAGNOSIS — E119 Type 2 diabetes mellitus without complications: Secondary | ICD-10-CM | POA: Insufficient documentation

## 2016-12-13 DIAGNOSIS — C801 Malignant (primary) neoplasm, unspecified: Secondary | ICD-10-CM | POA: Insufficient documentation

## 2016-12-13 DIAGNOSIS — D649 Anemia, unspecified: Secondary | ICD-10-CM | POA: Insufficient documentation

## 2016-12-13 DIAGNOSIS — E7800 Pure hypercholesterolemia, unspecified: Secondary | ICD-10-CM | POA: Insufficient documentation

## 2016-12-13 DIAGNOSIS — E039 Hypothyroidism, unspecified: Secondary | ICD-10-CM | POA: Insufficient documentation

## 2016-12-13 DIAGNOSIS — Z7982 Long term (current) use of aspirin: Secondary | ICD-10-CM | POA: Insufficient documentation

## 2016-12-13 DIAGNOSIS — E43 Unspecified severe protein-calorie malnutrition: Secondary | ICD-10-CM

## 2016-12-13 DIAGNOSIS — Z7984 Long term (current) use of oral hypoglycemic drugs: Secondary | ICD-10-CM | POA: Insufficient documentation

## 2016-12-13 DIAGNOSIS — K219 Gastro-esophageal reflux disease without esophagitis: Secondary | ICD-10-CM | POA: Insufficient documentation

## 2016-12-13 DIAGNOSIS — I10 Essential (primary) hypertension: Secondary | ICD-10-CM | POA: Insufficient documentation

## 2016-12-13 DIAGNOSIS — Z79899 Other long term (current) drug therapy: Secondary | ICD-10-CM | POA: Insufficient documentation

## 2016-12-13 MED ORDER — HEPARIN SOD (PORK) LOCK FLUSH 100 UNIT/ML IV SOLN
500.0000 [IU] | Freq: Once | INTRAVENOUS | Status: AC
  Administered 2016-12-13: 500 [IU] via INTRAVENOUS

## 2016-12-13 MED ORDER — SODIUM CHLORIDE 0.9% FLUSH
10.0000 mL | Freq: Once | INTRAVENOUS | Status: AC
  Administered 2016-12-13: 10 mL via INTRAVENOUS

## 2016-12-13 MED ORDER — SODIUM CHLORIDE 0.9 % IV SOLN
INTRAVENOUS | Status: DC
  Administered 2016-12-13: 10:00:00 via INTRAVENOUS

## 2016-12-13 NOTE — Patient Instructions (Signed)
[facility] at [facility]  Discharge Instructions:  You received hydration today.  _______________________________________________________________  Thank you for choosing [facility] at [facility] to provide your oncology and hematology care.  To afford each patient quality time with our providers, please arrive at least 15 minutes before your scheduled appointment.  You need to re-schedule your appointment if you arrive 10 or more minutes late.  We strive to give you quality time with our providers, and arriving late affects you and other patients whose appointments are after yours.  Also, if you no show three or more times for appointments you may be dismissed from the clinic.  Again, thank you for choosing  [facility] at [facility]. Our hope is that these requests will allow you access to exceptional care and in a timely manner. _______________________________________________________________  If you have questions after your visit, please contact our office at [phone] between the hours of 8:30 a.m. and 5:00 p.m. Voicemails left after 4:30 p.m. will not be returned until the following business day. _______________________________________________________________  For prescription refill requests, have your pharmacy contact our office. _______________________________________________________________  Recommendations made by the consultant and any test results will be sent to your referring physician. _______________________________________________________________ 

## 2016-12-13 NOTE — Progress Notes (Signed)
Reviewed constipation directions again with the patient and patient verbalized understanding.  Patient tolerated hydration with no complaints voiced.  Port site clean and dry with no bruising or swelling noted at site.  Band aid applied.  VSS stable with discharge and left ambulatory by self and riding [employer] home today.  No s/s of distress noted.

## 2016-12-15 ENCOUNTER — Encounter (HOSPITAL_BASED_OUTPATIENT_CLINIC_OR_DEPARTMENT_OTHER): Payer: Medicare Other | Admitting: Adult Health

## 2016-12-15 ENCOUNTER — Encounter (HOSPITAL_BASED_OUTPATIENT_CLINIC_OR_DEPARTMENT_OTHER): Payer: Medicare Other

## 2016-12-15 ENCOUNTER — Ambulatory Visit (HOSPITAL_COMMUNITY): Payer: Medicare Other

## 2016-12-15 ENCOUNTER — Other Ambulatory Visit (HOSPITAL_COMMUNITY): Payer: Medicare Other

## 2016-12-15 VITALS — BP 128/77 | HR 79 | Temp 98.9°F | Resp 18 | Wt 119.8 lb

## 2016-12-15 DIAGNOSIS — E039 Hypothyroidism, unspecified: Secondary | ICD-10-CM | POA: Diagnosis not present

## 2016-12-15 DIAGNOSIS — Z79899 Other long term (current) drug therapy: Secondary | ICD-10-CM | POA: Diagnosis not present

## 2016-12-15 DIAGNOSIS — C801 Malignant (primary) neoplasm, unspecified: Principal | ICD-10-CM

## 2016-12-15 DIAGNOSIS — I10 Essential (primary) hypertension: Secondary | ICD-10-CM

## 2016-12-15 DIAGNOSIS — E7800 Pure hypercholesterolemia, unspecified: Secondary | ICD-10-CM | POA: Diagnosis not present

## 2016-12-15 DIAGNOSIS — C786 Secondary malignant neoplasm of retroperitoneum and peritoneum: Secondary | ICD-10-CM | POA: Diagnosis not present

## 2016-12-15 DIAGNOSIS — E119 Type 2 diabetes mellitus without complications: Secondary | ICD-10-CM | POA: Diagnosis not present

## 2016-12-15 DIAGNOSIS — E876 Hypokalemia: Secondary | ICD-10-CM | POA: Diagnosis not present

## 2016-12-15 DIAGNOSIS — Z5111 Encounter for antineoplastic chemotherapy: Secondary | ICD-10-CM

## 2016-12-15 DIAGNOSIS — D649 Anemia, unspecified: Secondary | ICD-10-CM | POA: Diagnosis not present

## 2016-12-15 DIAGNOSIS — Z7982 Long term (current) use of aspirin: Secondary | ICD-10-CM | POA: Diagnosis not present

## 2016-12-15 DIAGNOSIS — R630 Anorexia: Secondary | ICD-10-CM | POA: Diagnosis not present

## 2016-12-15 DIAGNOSIS — R300 Dysuria: Secondary | ICD-10-CM | POA: Diagnosis present

## 2016-12-15 DIAGNOSIS — K219 Gastro-esophageal reflux disease without esophagitis: Secondary | ICD-10-CM | POA: Diagnosis not present

## 2016-12-15 DIAGNOSIS — Z7984 Long term (current) use of oral hypoglycemic drugs: Secondary | ICD-10-CM | POA: Diagnosis not present

## 2016-12-15 DIAGNOSIS — C481 Malignant neoplasm of specified parts of peritoneum: Secondary | ICD-10-CM

## 2016-12-15 LAB — CBC WITH DIFFERENTIAL/PLATELET
BASOS PCT: 1 %
Basophils Absolute: 0 10*3/uL (ref 0.0–0.1)
EOS PCT: 1 %
Eosinophils Absolute: 0 10*3/uL (ref 0.0–0.7)
HEMATOCRIT: 28.6 % — AB (ref 36.0–46.0)
Hemoglobin: 8.9 g/dL — ABNORMAL LOW (ref 12.0–15.0)
Lymphocytes Relative: 33 %
Lymphs Abs: 1.7 10*3/uL (ref 0.7–4.0)
MCH: 26.3 pg (ref 26.0–34.0)
MCHC: 31.1 g/dL (ref 30.0–36.0)
MCV: 84.4 fL (ref 78.0–100.0)
MONO ABS: 0.5 10*3/uL (ref 0.1–1.0)
MONOS PCT: 9 %
Neutro Abs: 2.9 10*3/uL (ref 1.7–7.7)
Neutrophils Relative %: 56 %
PLATELETS: 332 10*3/uL (ref 150–400)
RBC: 3.39 MIL/uL — ABNORMAL LOW (ref 3.87–5.11)
RDW: 20.1 % — AB (ref 11.5–15.5)
WBC: 5.1 10*3/uL (ref 4.0–10.5)

## 2016-12-15 LAB — COMPREHENSIVE METABOLIC PANEL
ALBUMIN: 3.4 g/dL — AB (ref 3.5–5.0)
ALT: 14 U/L (ref 14–54)
ANION GAP: 8 (ref 5–15)
AST: 25 U/L (ref 15–41)
Alkaline Phosphatase: 57 U/L (ref 38–126)
BILIRUBIN TOTAL: 0.3 mg/dL (ref 0.3–1.2)
BUN: 8 mg/dL (ref 6–20)
CO2: 31 mmol/L (ref 22–32)
Calcium: 8.8 mg/dL — ABNORMAL LOW (ref 8.9–10.3)
Chloride: 100 mmol/L — ABNORMAL LOW (ref 101–111)
Creatinine, Ser: 0.53 mg/dL (ref 0.44–1.00)
GFR calc Af Amer: >60 mL/min (ref >60)
GFR calc non Af Amer: >60 mL/min (ref >60)
GLUCOSE: 112 mg/dL — AB (ref 65–99)
POTASSIUM: 3.3 mmol/L — AB (ref 3.5–5.1)
SODIUM: 139 mmol/L (ref 135–145)
TOTAL PROTEIN: 6.5 g/dL (ref 6.5–8.1)

## 2016-12-15 MED ORDER — POTASSIUM CHLORIDE CRYS ER 20 MEQ PO TBCR
EXTENDED_RELEASE_TABLET | ORAL | Status: AC
  Filled 2016-12-15: qty 2

## 2016-12-15 MED ORDER — SODIUM CHLORIDE 0.9% FLUSH
10.0000 mL | INTRAVENOUS | Status: DC | PRN
  Administered 2016-12-15: 10 mL
  Filled 2016-12-15: qty 10

## 2016-12-15 MED ORDER — PALONOSETRON HCL INJECTION 0.25 MG/5ML
0.2500 mg | Freq: Once | INTRAVENOUS | Status: AC
  Administered 2016-12-15: 0.25 mg via INTRAVENOUS
  Filled 2016-12-15: qty 5

## 2016-12-15 MED ORDER — SODIUM CHLORIDE 0.9 % IV SOLN
20.0000 mg | Freq: Once | INTRAVENOUS | Status: AC
  Administered 2016-12-15: 20 mg via INTRAVENOUS
  Filled 2016-12-15: qty 2

## 2016-12-15 MED ORDER — PACLITAXEL CHEMO INJECTION 300 MG/50ML
175.0000 mg/m2 | Freq: Once | INTRAVENOUS | Status: AC
  Administered 2016-12-15: 288 mg via INTRAVENOUS
  Filled 2016-12-15: qty 48

## 2016-12-15 MED ORDER — MEGESTROL ACETATE 40 MG/ML PO SUSP: 80.0000 mg | Freq: Two times a day (BID) | ORAL | 0 refills | Status: DC

## 2016-12-15 MED ORDER — SODIUM CHLORIDE 0.9 % IV SOLN
Freq: Once | INTRAVENOUS | Status: AC
  Administered 2016-12-15: 11:00:00 via INTRAVENOUS

## 2016-12-15 MED ORDER — CARBOPLATIN CHEMO INJECTION 450 MG/45ML
349.5000 mg | Freq: Once | INTRAVENOUS | Status: AC
  Administered 2016-12-15: 350 mg via INTRAVENOUS
  Filled 2016-12-15: qty 35

## 2016-12-15 MED ORDER — FAMOTIDINE IN NACL 20-0.9 MG/50ML-% IV SOLN
20.0000 mg | Freq: Once | INTRAVENOUS | Status: AC
  Administered 2016-12-15: 20 mg via INTRAVENOUS
  Filled 2016-12-15: qty 50

## 2016-12-15 MED ORDER — POTASSIUM CHLORIDE CRYS ER 20 MEQ PO TBCR
40.0000 meq | EXTENDED_RELEASE_TABLET | Freq: Once | ORAL | Status: AC
  Administered 2016-12-15: 40 meq via ORAL

## 2016-12-15 MED ORDER — POTASSIUM CHLORIDE ER 20 MEQ PO TBCR: 20.0000 meq | EXTENDED_RELEASE_TABLET | Freq: Two times a day (BID) | ORAL | 3 refills | Status: DC

## 2016-12-15 MED ORDER — DIPHENHYDRAMINE HCL 50 MG/ML IJ SOLN
50.0000 mg | Freq: Once | INTRAMUSCULAR | Status: AC
  Administered 2016-12-15: 50 mg via INTRAVENOUS
  Filled 2016-12-15: qty 1

## 2016-12-15 MED ORDER — HEPARIN SOD (PORK) LOCK FLUSH 100 UNIT/ML IV SOLN
500.0000 [IU] | Freq: Once | INTRAVENOUS | Status: AC | PRN
  Administered 2016-12-15: 500 [IU]

## 2016-12-15 NOTE — Progress Notes (Signed)
Labs reviewed with Mike Craze, NP, and potassium 3.3.  Ok to treat today.    Patient tolerated chemotherapy well.  Port site clean and dry with no bruising or swelling noted at site.  Band aid applied.  Patient stated low back stiffness from sitting in chair and daughter gave the patient an Aleve without notifying the nurses.  Patient ambulated in the room and stated the stiffness was better.  Instructed the patient to call if the stiffness or discomfort continued.  Patient and daughter verbalized understanding.  VSS with discharge and left ambulatory with daughter.  No other complaints voiced.  No s/s of distress noted.

## 2016-12-15 NOTE — Progress Notes (Signed)
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 Serita Grit note on 10/20/16)  Sherry Hopkins is a 76 y.o. female with a medical history significant for hypertension, hypothyroidism, type 2 diabetes, GERD who is referred to the [facility] for peritoneal carcinomatosis.  Patient reported emergency department on 10/07/2016 with abdominal pain. She reported at that time that it started approximately 1 week ago.. This is accompanied by nausea with vomiting. She notes 5-10 emeses per day. Emeses has the appearance of stomach contents. At that time, she denied any fevers.  In the emergency department, CT imaging of abdomen and pelvis was performed.   --- [name]  is a 76 y.o.  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 She has severe protein malnutrition and debilitation from her disease.   Given that Ms Slager is of such poor performance status and nutritional status I do not think that she is a good candidate for primary upfront debulking surgery.  Instead I am recommending neoadjuvant chemotherapy with 3 cycles of carboplatin and paclitaxel followed by repeat imaging and interval debulking 3 weeks after cycle 3 should she demonstrate good response to therapy and improved underlying health.     INTERVAL HISTORY:  Sherry Hopkins 76 y.o. female returns for routine follow-up for ovarian cancer/primary peritoneal cancer.   Here today with her daughter.  Overall, she tells me that she is feeling much better.  The IVF have been helping her appetite some; appetite 50%; energy levels  50%. She states "both my appetite and energy are better."   Her daughter states that she has noticed that her mom is eating a bit better in recent weeks.  They are wondering if there are other things she can do to continue to improve her appetite.  Chart reviewed; her weight on 12/06/16 was ~111 lbs. Weight today is 119 lbs.    Her constipation is improving; she has been using Miralax and occasional milk of magnesia.  She's had some continued dizziness, but is improving as well.  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    Due for cycle #3 Carbo/Taxol chemo today.   She is scheduled to see Dr. Denman George with gyn-onc on 12/27/16.        REVIEW OF SYSTEMS:  Review of Systems  Constitutional: Positive for appetite change, fatigue and unexpected weight change. Negative for chills and fever.  HENT:  Negative.   Eyes: Negative.   Respiratory: Negative.   Cardiovascular: Negative.   Gastrointestinal: Positive for constipation. Negative for abdominal pain, blood in stool, diarrhea, nausea and vomiting.  Endocrine: Negative.   Genitourinary: Negative.  Negative for dysuria, hematuria and vaginal bleeding.   Musculoskeletal: Negative.   Skin: Negative.   Neurological: Positive for dizziness and numbness.  Hematological: Negative.      PAST MEDICAL/SURGICAL HISTORY:  Past Medical History:  Diagnosis Date  . Diabetes mellitus type 2, diet-controlled (HCC)    diet controlled  . Hypercholesteremia   . Hypertension   . Hypothyroidism   .  Iron deficiency anemia 10/12/2016  . Peritoneal carcinomatosis ([place]) 10/11/2016   Past Surgical History:  Procedure Laterality Date  . ABDOMINAL HYSTERECTOMY    . CATARACT EXTRACTION W/PHACO  08/23/2011   Procedure: CATARACT EXTRACTION PHACO AND INTRAOCULAR LENS PLACEMENT (IOC);  Surgeon: Tonny Branch, MD;  Location: [facility];  Service: Ophthalmology;  Laterality: Left;   CDE:15.60  . CATARACT EXTRACTION W/PHACO Right 08/16/2013   Procedure: CATARACT EXTRACTION PHACO AND INTRAOCULAR LENS PLACEMENT (IOC);  Surgeon: Tonny Branch, MD;  Location: [facility];  Service: Ophthalmology;  Laterality: Right;  CDE:8.18  . PORTACATH PLACEMENT Left 11/01/2016   Procedure: INSERTION PORT-A-CATH LEFT SUBCLAVIAN;  Surgeon: Aviva Signs, MD;  Location: [facility];  Service: General;  Laterality: Left;     SOCIAL HISTORY:  Social History   Social History  . Marital status: Divorced    Spouse name: N/A  . Number of children: N/A  . Years of education: N/A   Occupational History  . Not on file.   Social History Main Topics  . Smoking status: Never Smoker  . Smokeless tobacco: Never Used  . Alcohol use No  . Drug use: No  . Sexual activity: Yes    Birth control/ protection: Surgical   Other Topics Concern  . Not on file   Social History Narrative  . No narrative on file    FAMILY HISTORY:  No family history on file.  CURRENT MEDICATIONS:  Outpatient Encounter Prescriptions as of 12/15/2016  Medication Sig Note  . aspirin EC 81 MG tablet Take 81 mg by mouth every morning.   . Cholecalciferol (VITAMIN D PO) Take 1 tablet by mouth daily.   . enalapril (VASOTEC) 5 MG tablet Take 5 mg by mouth every morning. 2/83/1517: Patient is uncertain if she is still taking this medication or not  . furosemide (LASIX) 40 MG tablet Take 1 tablet (40 mg total) by mouth daily as needed. (Patient taking differently: Take 40 mg by mouth daily as needed for fluid or edema. )   . levothyroxine (SYNTHROID, LEVOTHROID) 25 MCG tablet Take 25 mcg by mouth daily before breakfast.   . lidocaine-prilocaine (EMLA) cream Apply a quarter size amount to affected area 1 hour prior to coming to chemotherapy.   Marland Kitchen LORazepam (ATIVAN) 0.5 MG tablet Take 1 tablet (0.5 mg total) by mouth every 8 (eight) hours. (Patient taking differently: Take 0.5 mg by mouth every 8 (eight) hours as needed for anxiety. )   .  megestrol (MEGACE) 40 MG/ML suspension Take 2 mLs (80 mg total) by mouth 2 (two) times daily.   . metFORMIN (GLUCOPHAGE-XR) 500 MG 24 hr tablet Take 500 mg by mouth daily with supper.    . naproxen sodium (ANAPROX) 220 MG tablet Take 220 mg by mouth daily as needed (pain).    Marland Kitchen omeprazole (PRILOSEC) 20 MG capsule Take 1 capsule (20 mg total) by mouth daily. 10/29/2016: Sometimes forgets to take.  . ondansetron (ZOFRAN ODT) 4 MG disintegrating tablet 4mg  ODT q4 hours prn nausea/vomit   . ondansetron (ZOFRAN-ODT) 8 MG disintegrating tablet TAKE 1 TABLET (8 MG TOTAL) BY MOUTH EVERY 8 (EIGHT) HOURS AS NEEDED FOR NAUSEA OR VOMITING.   . polyethylene glycol powder (GLYCOLAX/MIRALAX) powder Take 1 capful daily. (Patient taking differently: Take 17 g by mouth daily. Take 1 capful daily. 1 capful = 17g)   . Potassium Chloride ER 20 MEQ TBCR Take 20 mEq by mouth 2 (two) times daily.   . pravastatin (PRAVACHOL) 80 MG tablet Take 80 mg  by mouth daily.    . prochlorperazine (COMPAZINE) 10 MG tablet Take 1 tablet (10 mg total) by mouth every 6 (six) hours as needed for nausea or vomiting.   Marland Kitchen spironolactone (ALDACTONE) 25 MG tablet Take 25 mg by mouth 2 (two) times daily. 10/29/2016: Forgets the second dose often.  . traMADol (ULTRAM) 50 MG tablet Take 1 tablet (50 mg total) by mouth every 6 (six) hours as needed. (Patient taking differently: Take 50 mg by mouth every 6 (six) hours as needed (pain). )   . valsartan (DIOVAN) 80 MG tablet Take 80 mg by mouth daily.   . [DISCONTINUED] megestrol (MEGACE) 40 MG/ML suspension Take 2 mLs (80 mg total) by mouth 2 (two) times daily.   . [DISCONTINUED] potassium chloride 20 MEQ TBCR Take 10 mEq by mouth 2 (two) times daily. (Patient taking differently: Take 20 mEq by mouth 2 (two) times daily. ) 10/29/2016: Seems confused about this med.   No facility-administered encounter medications on file as of 12/15/2016.     ALLERGIES:  No Known Allergies   PHYSICAL EXAM:  ECOG  Performance status: 2-3 - Symptomatic; requires assistance     Physical Exam  Constitutional: She is oriented to person, place, and time.  -Thin female in no acute distress -Seen in chemo chair in infusion area   HENT:  Head: Normocephalic.  Mouth/Throat: Oropharynx is clear and moist.  Eyes: Conjunctivae are normal. No scleral icterus.  Neck: Normal range of motion. Neck supple.  Cardiovascular: Normal rate and regular rhythm.   Pulmonary/Chest: Effort normal and breath sounds normal. No respiratory distress.  Abdominal: Soft. Bowel sounds are normal. There is no tenderness.  Musculoskeletal: Normal range of motion. She exhibits no edema.  Lymphadenopathy:    She has no cervical adenopathy.       Right: No supraclavicular adenopathy present.       Left: No supraclavicular adenopathy present.  Neurological: She is alert and oriented to person, place, and time. No cranial nerve deficit.  Skin: Skin is warm and dry. No rash noted.  Psychiatric: Memory and judgment normal.  Nursing note and vitals reviewed.    LABORATORY DATA:  I have reviewed the labs as listed.  CBC    Component Value Date/Time   WBC 5.1 12/15/2016 0956   RBC 3.39 (L) 12/15/2016 0956   HGB 8.9 (L) 12/15/2016 0956   HCT 28.6 (L) 12/15/2016 0956   PLT 332 12/15/2016 0956   MCV 84.4 12/15/2016 0956   MCH 26.3 12/15/2016 0956   MCHC 31.1 12/15/2016 0956   RDW 20.1 (H) 12/15/2016 0956   LYMPHSABS 1.7 12/15/2016 0956   MONOABS 0.5 12/15/2016 0956   EOSABS 0.0 12/15/2016 0956   BASOSABS 0.0 12/15/2016 0956   CMP Latest Ref Rng & Units 12/15/2016 11/24/2016 11/10/2016  Glucose 65 - 99 mg/dL 112(H) 97 111(H)  BUN 6 - 20 mg/dL 8 9 13   Creatinine 0.44 - 1.00 mg/dL 0.53 0.72 0.70  Sodium 135 - 145 mmol/L 139 140 133(L)  Potassium 3.5 - 5.1 mmol/L 3.3(L) 3.5 3.5  Chloride 101 - 111 mmol/L 100(L) 97(L) 92(L)  CO2 22 - 32 mmol/L 31 34(H) 30  Calcium 8.9 - 10.3 mg/dL 8.8(L) 9.2 9.1  Total Protein 6.5 - 8.1 g/dL  6.5 7.4 7.6  Total Bilirubin 0.3 - 1.2 mg/dL 0.3 0.4 0.7  Alkaline Phos 38 - 126 U/L 57 67 72  AST 15 - 41 U/L 25 23 24   ALT 14 - 54 U/L 14 16 21  PENDING LABS:    DIAGNOSTIC IMAGING:  *The following radiologic images and reports have been reviewed independently and agree with below findings.  CT abd/pelvis: 10/07/16 CLINICAL DATA:  Abdominal pain  EXAM: CT ABDOMEN AND PELVIS WITH CONTRAST  TECHNIQUE: Multidetector CT imaging of the abdomen and pelvis was performed using the standard protocol following bolus administration of intravenous contrast.  CONTRAST:  184mL ISOVUE-300 IOPAMIDOL (ISOVUE-300) INJECTION 61%  COMPARISON:  None.  FINDINGS: Lower chest: No acute abnormality.  Hepatobiliary: No focal liver abnormality. The gallbladder is normal. No biliary dilatation.  Pancreas: There is a normal appearance of the pancreas.  Spleen: Spleen is unremarkable  Adrenals/Urinary Tract: The adrenal glands both appear normal. 1 cm cyst arises from the midpole of left kidney. Urinary bladder is not well seen.  Stomach/Bowel: Moderate size hiatal hernia. No pathologic dilatation of the large or small bowel loops.  Vascular/Lymphatic: Aortic atherosclerosis. No enlarged retroperitoneal lymph nodes. No pelvic or inguinal adenopathy.  Reproductive: The uterus is surgically absent. The adnexal structures are not well visualized.  Other: There is a marked volume of complex and loculated ascites within the abdomen and pelvis. There is evidence of extensive peritoneal carcinomatosis with omental cake measuring 14.1 x 2.5 by 6.9 cm. There are numerous calcified peritoneal deposits identified predominantly involving the dependent portions of the peritoneal cavity.  Musculoskeletal: No acute or significant osseous findings.  IMPRESSION: 1. There is evidence of extensive peritoneal carcinomatosis including large volume of loculated ascites, omental caking,  and calcified peritoneal deposits. The patient is status post hysterectomy. If the patient still has ovaries then this would be a likely origin. Alternatively GI malignancies may present with peritoneal carcinomatosis. Oncologic consultation is advised. 2. At this time there is no evidence for high-grade bowel obstruction or obstructive uropathy.   Electronically Signed   By: Kerby Moors M.D.   On: 10/07/2016 16:35   CT chest: 10/18/16 CLINICAL DATA:  Peritoneal carcinomatosis.  EXAM: CT CHEST WITHOUT CONTRAST  TECHNIQUE: Multidetector CT imaging of the chest was performed following the standard protocol without IV contrast.  COMPARISON:  Abdomen pelvis CT from 10/07/2016  FINDINGS: Cardiovascular: The heart is enlarged. No substantial pericardial effusion. Coronary artery calcification is noted. Atherosclerotic calcification is noted in the wall of the thoracic aorta.  Mediastinum/Nodes: No mediastinal lymphadenopathy. Index precarinal lymph node is 9 mm in short axis. No evidence for gross hilar lymphadenopathy although assessment is limited by the lack of intravenous contrast on today's study. Tiny hiatal hernia. Esophagus otherwise unremarkable. There is no axillary lymphadenopathy.  Lungs/Pleura: Subpleural atelectasis noted in the lung bases. No suspicious pulmonary nodule or mass. No focal airspace consolidation. No pulmonary edema. Tiny left pleural effusion noted.  Upper Abdomen: Intraperitoneal fluid again evident, better documented on the recent abdomen and pelvis CT.  Musculoskeletal: Bone windows reveal no worrisome lytic or sclerotic osseous lesions.  IMPRESSION: 1. Tiny left pleural effusion with basilar atelectasis in both lungs. No suspicious pulmonary nodule or mass. 2.  Emphysema. (ICD10-J43.9) 3.  Aortic Atherosclerois (ICD10-170.0)   Electronically Signed   By: Misty Stanley M.D.   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 Evaluated by Gyn-Onc (Dr. Denman George) who felt that patient was not a candidate for upfront debulking surgery d/t poor performance status and protein/calorie malnutrition. Dr. Denman George has recommended Cdarbo/Taxol x 3  cycles, followed by restaging imaging and consideration for debulking surgery about 3 weeks after cycle #3. She may require additional chemotherapy in the adjuvant setting as well.  -Due for cycle #3 Carbo/Taxol today.  Labs reviewed and are adequate for treatment today.   -Restaging imaging with CT chest/abd/pelvis due before she goes to see Dr. Denman George on 12/27/16; orders placed today.   -Return to cancer center a few days after she sees gyn-onc to solidify plan of care/treatment plan.    Hypokalemia:  -Likely d/t malignancy, chemo, and decreased nutritional intake.  -K 3.3 today.  Will replete potassium today. She has periodically had episodes of hypokalemia and will likely need to increase potassium supplementation at home.  -E-scribed K-Dur for patient to start as an outpatient (20 mEq po BID)   Protein/calorie malnutrition and Decreased appetite:  -Continue her oral intake efforts with food/Ensure or Boost intake. Her weight has increased, which is encouraging.   -Continue IVF 3 days per week (on Mon/Wed/Fri) for supportive care while she continues chemotherapy.  -Discussed the option of starting an appetite stimulant like Megace or Marinol. Reviewed the mechanism of action and possible side effects of each. She would like to Megace. Will start her on Megace 160 mg po BID; e-scribed to her pharmacy.  If not helpful, then we can certainly try Marinol in the future if needed.        Dispo:  -Restaging CT chest/abd/pelvis soon; orders placed today.  -Seeing gyn-onc on 12/27/16 after scans.  -Return to cancer center a few days after she  sees Dr. Denman George for continued treatment planning.    All questions were answered to patient's stated satisfaction. Encouraged patient to call with any new concerns or questions before her next visit to the cancer center and we can certain see her sooner, if needed.    Plan of care discussed with Dr. Talbert Cage, who agrees with the above aforementioned.    Orders placed this encounter:  Orders Placed This Encounter  Procedures  . CT Chest W Contrast  . CT Abdomen Pelvis W Contrast      Mike Craze, NP [place] [phone]

## 2016-12-15 NOTE — Patient Instructions (Signed)
[place] Discharge Instructions for Patients Receiving Chemotherapy  Today you received the following chemotherapy agents taxol and carboplatin.   To help prevent nausea and vomiting after your treatment, we encourage you to take your nausea medication.    If you develop nausea and vomiting that is not controlled by your nausea medication, call the clinic.   BELOW ARE SYMPTOMS THAT SHOULD BE REPORTED IMMEDIATELY:  *FEVER GREATER THAN 100.5 F  *CHILLS WITH OR WITHOUT FEVER  NAUSEA AND VOMITING THAT IS NOT CONTROLLED WITH YOUR NAUSEA MEDICATION  *UNUSUAL SHORTNESS OF BREATH  *UNUSUAL BRUISING OR BLEEDING  TENDERNESS IN MOUTH AND THROAT WITH OR WITHOUT PRESENCE OF ULCERS  *URINARY PROBLEMS  *BOWEL PROBLEMS  UNUSUAL RASH Items with * indicate a potential emergency and should be followed up as soon as possible.  Feel free to call the clinic should you have any questions or concerns. The clinic phone number is [phone].  Please show the [place] at check-in to the Emergency Department and triage nurse.

## 2016-12-17 ENCOUNTER — Encounter (HOSPITAL_COMMUNITY): Payer: Medicare Other

## 2016-12-17 ENCOUNTER — Encounter (HOSPITAL_COMMUNITY): Payer: Self-pay

## 2016-12-17 ENCOUNTER — Encounter (HOSPITAL_BASED_OUTPATIENT_CLINIC_OR_DEPARTMENT_OTHER): Payer: Medicare Other

## 2016-12-17 VITALS — BP 118/64 | HR 78 | Temp 97.6°F | Resp 20

## 2016-12-17 DIAGNOSIS — E876 Hypokalemia: Secondary | ICD-10-CM

## 2016-12-17 DIAGNOSIS — C801 Malignant (primary) neoplasm, unspecified: Principal | ICD-10-CM

## 2016-12-17 DIAGNOSIS — C786 Secondary malignant neoplasm of retroperitoneum and peritoneum: Secondary | ICD-10-CM | POA: Diagnosis not present

## 2016-12-17 MED ORDER — SODIUM CHLORIDE 0.9 % IV SOLN
INTRAVENOUS | Status: DC
  Administered 2016-12-17: 11:00:00 via INTRAVENOUS

## 2016-12-17 MED ORDER — HEPARIN SOD (PORK) LOCK FLUSH 100 UNIT/ML IV SOLN
INTRAVENOUS | Status: AC
  Filled 2016-12-17: qty 5

## 2016-12-17 MED ORDER — SODIUM CHLORIDE 0.9% FLUSH
10.0000 mL | INTRAVENOUS | Status: DC | PRN
  Administered 2016-12-17: 10 mL via INTRAVENOUS
  Filled 2016-12-17: qty 10

## 2016-12-17 MED ORDER — HEPARIN SOD (PORK) LOCK FLUSH 100 UNIT/ML IV SOLN
500.0000 [IU] | Freq: Once | INTRAVENOUS | Status: AC
  Administered 2016-12-17: 500 [IU] via INTRAVENOUS

## 2016-12-17 NOTE — Patient Instructions (Signed)
[place] at [facility] Discharge Instructions  RECOMMENDATIONS MADE BY THE CONSULTANT AND ANY TEST RESULTS WILL BE SENT TO YOUR REFERRING PHYSICIAN.  Received Hydration with NS over 2 hours today. Follow-up as scheduled. Call clinic for any questions or concerns  Thank you for choosing [place] at [facility] to provide your oncology and hematology care.  To afford each patient quality time with our provider, please arrive at least 15 minutes before your scheduled appointment time.    If you have a lab appointment with the [facility] please come in thru the  Main Entrance and check in at the main information desk  You need to re-schedule your appointment should you arrive 10 or more minutes late.  We strive to give you quality time with our providers, and arriving late affects you and other patients whose appointments are after yours.  Also, if you no show three or more times for appointments you may be dismissed from the clinic at the providers discretion.     Again, thank you for choosing [facility].  Our hope is that these requests will decrease the amount of time that you wait before being seen by our physicians.       _____________________________________________________________  Should you have questions after your visit to [facility], please contact our office at [phone] between the hours of 8:30 a.m. and 4:30 p.m.  Voicemails left after 4:30 p.m. will not be returned until the following business day.  For prescription refill requests, have your pharmacy contact our office.       Resources For Cancer Patients and their Caregivers ? American Cancer Society: Can assist with transportation, wigs, general needs, runs Look Good Feel Better.        (808)779-7462 ? Cancer Care: Provides financial assistance, online support groups, medication/co-pay assistance.  1-800-813-HOPE (579) 366-9576) ? [place] Assists [place] cancer patients and their families through emotional , educational and financial support.  6065542479 ? [place] DSS Where to apply for food stamps, Medicaid and utility assistance. 217-827-8826 ? [facility]: Transportation to medical appointments. 9784940772 ? Social Security Administration: May apply for disability if have a Stage IV cancer. 515 815 9340 9398162591 ? [employer], Disability and Transit Services: Assists with nutrition, care and transit needs. [place] Support Programs: @10RELATIVEDAYS @ > Cancer Support Group  2nd Tuesday of the month 1pm-2pm, Journey Room  > Creative Journey  3rd Tuesday of the month 1130am-1pm, Journey Room  > Look Good Feel Better  1st Wednesday of the month 10am-12 noon, Journey Room (Call [place] to register [phone])

## 2016-12-17 NOTE — Progress Notes (Signed)
Sherry Hopkins tolerated 2 hours of hydration with NS well without complaints or incident. VSS upon discharge. Pt discharged self ambulatory in satisfactory condition

## 2016-12-17 NOTE — Progress Notes (Signed)
Nutrition Follow-up:  Nutrition follow-up completed during infusion this am.  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    Patient alone in infusion room.  Reports daughter brought her this am but she has already left and planning to go home on [place].  Reports that she is eating better.  Ate 100% of chicken biscuit from [employer] (440 calories 16 gm of protein) per report.  Ate 2 chicken legs, mashed potatoes with gravy and coleslaw last night from [facility] per patient (unsure amount of mashed potatoes and coleslaw).  Couldn't remember what she had for lunch or breakfast yesterday.  Reports that she is drinking ensure (3 yesterday and 1 this am).  Thanked me for ensure plus case that daughter picked up on 10/3 (2nd case).  Reports that she is continuing to drink water, gatorade and gingerale daily.    Reports had normal bowel movement yesterday.    No nausea reported.      Medications: reviewed  Labs: reviewed  Anthropometrics:   Weight increased to 119 lb 12.8 oz on 10/3 from 114 lb 6.4 oz on 9/26. (also has been receiving fluids 3 times per week)   NUTRITION DIAGNOSIS: Inadequate oral intake improved per patient report   MALNUTRITION DIAGNOSIS: severe malnutrition continues   INTERVENTION:   Encouraged patient to continue with ensure plus drinks. Daughter picked up 2nd case on 10/3 during infusion Encouraged patient to continue to choose high calorie and protein foods. Stressed importance of fluids as well.    MONITORING, EVALUATION, GOAL: weight trends, intake   NEXT VISIT: October 19 th during infusion  Rashawn Rayman B. Zenia Resides, [place], [place] Registered Dietitian [phone] (pager)

## 2016-12-20 ENCOUNTER — Encounter (HOSPITAL_BASED_OUTPATIENT_CLINIC_OR_DEPARTMENT_OTHER): Payer: Medicare Other

## 2016-12-20 ENCOUNTER — Encounter (HOSPITAL_COMMUNITY): Payer: Self-pay

## 2016-12-20 VITALS — BP 122/65 | HR 72 | Temp 98.3°F | Resp 18 | Wt 119.4 lb

## 2016-12-20 DIAGNOSIS — E876 Hypokalemia: Secondary | ICD-10-CM | POA: Diagnosis not present

## 2016-12-20 DIAGNOSIS — C786 Secondary malignant neoplasm of retroperitoneum and peritoneum: Secondary | ICD-10-CM | POA: Diagnosis not present

## 2016-12-20 DIAGNOSIS — C801 Malignant (primary) neoplasm, unspecified: Principal | ICD-10-CM

## 2016-12-20 MED ORDER — SODIUM CHLORIDE 0.9 % IV SOLN
INTRAVENOUS | Status: DC
  Administered 2016-12-20: 1000 mL via INTRAVENOUS

## 2016-12-20 MED ORDER — SODIUM CHLORIDE 0.9% FLUSH
10.0000 mL | Freq: Once | INTRAVENOUS | Status: AC
  Administered 2016-12-20: 10 mL via INTRAVENOUS

## 2016-12-20 MED ORDER — HEPARIN SOD (PORK) LOCK FLUSH 100 UNIT/ML IV SOLN
500.0000 [IU] | Freq: Once | INTRAVENOUS | Status: AC
  Administered 2016-12-20: 500 [IU] via INTRAVENOUS
  Filled 2016-12-20: qty 5

## 2016-12-20 NOTE — Patient Instructions (Signed)
[place] at [facility] Discharge Instructions  RECOMMENDATIONS MADE BY THE CONSULTANT AND ANY TEST RESULTS WILL BE SENT TO YOUR REFERRING PHYSICIAN.  Received 2 hours of hydration with NS today. Follow-up as scheduled. Call clinic for any questions or concerns  Thank you for choosing [place] at [facility] to provide your oncology and hematology care.  To afford each patient quality time with our provider, please arrive at least 15 minutes before your scheduled appointment time.    If you have a lab appointment with the [facility] please come in thru the  Main Entrance and check in at the main information desk  You need to re-schedule your appointment should you arrive 10 or more minutes late.  We strive to give you quality time with our providers, and arriving late affects you and other patients whose appointments are after yours.  Also, if you no show three or more times for appointments you may be dismissed from the clinic at the providers discretion.     Again, thank you for choosing [facility].  Our hope is that these requests will decrease the amount of time that you wait before being seen by our physicians.       _____________________________________________________________  Should you have questions after your visit to [facility], please contact our office at [phone] between the hours of 8:30 a.m. and 4:30 p.m.  Voicemails left after 4:30 p.m. will not be returned until the following business day.  For prescription refill requests, have your pharmacy contact our office.       Resources For Cancer Patients and their Caregivers ? American Cancer Society: Can assist with transportation, wigs, general needs, runs Look Good Feel Better.        408 197 4199 ? Cancer Care: Provides financial assistance, online support groups, medication/co-pay assistance.  1-800-813-HOPE 365-125-4896) ? [place] Assists [place] cancer patients and their families through emotional , educational and financial support.  614-717-0466 ? [place] DSS Where to apply for food stamps, Medicaid and utility assistance. (978) 287-8185 ? RCATS: Transportation to medical appointments. 7791960941 ? Social Security Administration: May apply for disability if have a Stage IV cancer. 959-859-8404 705-568-9276 ? [employer], Disability and Transit Services: Assists with nutrition, care and transit needs. [place] Support Programs: @10RELATIVEDAYS @ > Cancer Support Group  2nd Tuesday of the month 1pm-2pm, Journey Room  > Creative Journey  3rd Tuesday of the month 1130am-1pm, Journey Room  > Look Good Feel Better  1st Wednesday of the month 10am-12 noon, Journey Room (Call [place] to register [phone])

## 2016-12-20 NOTE — Progress Notes (Signed)
Sherry Hopkins tolerated hydration with NS well without complaints or incident. VSS upon discharge. Pt discharged self ambulatory in satisfactory condition

## 2016-12-22 ENCOUNTER — Encounter (HOSPITAL_COMMUNITY): Payer: Self-pay

## 2016-12-22 ENCOUNTER — Encounter (HOSPITAL_BASED_OUTPATIENT_CLINIC_OR_DEPARTMENT_OTHER): Payer: Medicare Other

## 2016-12-22 DIAGNOSIS — C786 Secondary malignant neoplasm of retroperitoneum and peritoneum: Secondary | ICD-10-CM | POA: Diagnosis not present

## 2016-12-22 DIAGNOSIS — E876 Hypokalemia: Secondary | ICD-10-CM | POA: Diagnosis not present

## 2016-12-22 MED ORDER — SODIUM CHLORIDE 0.9 % IV SOLN
INTRAVENOUS | Status: DC
  Administered 2016-12-22: 12:00:00 via INTRAVENOUS

## 2016-12-22 MED ORDER — SODIUM CHLORIDE 0.9% FLUSH
10.0000 mL | INTRAVENOUS | Status: DC | PRN
  Administered 2016-12-22: 10 mL via INTRAVENOUS
  Filled 2016-12-22: qty 10

## 2016-12-22 MED ORDER — HEPARIN SOD (PORK) LOCK FLUSH 100 UNIT/ML IV SOLN
500.0000 [IU] | Freq: Once | INTRAVENOUS | Status: AC
  Administered 2016-12-22: 500 [IU] via INTRAVENOUS

## 2016-12-22 NOTE — Progress Notes (Signed)
Sherry Hopkins tolerated hydration with NS well without complaints or incident. Reviewed diarrhea and constipation sheets with pt since she experienced some diarrhea 2 days ago after having difficulty with constipation. Pt verbalized understanding. VSS upon discharge. Pt discharged self ambulatory in satisfactory condition

## 2016-12-22 NOTE — Patient Instructions (Signed)
[place] at [facility] Discharge Instructions  RECOMMENDATIONS MADE BY THE CONSULTANT AND ANY TEST RESULTS WILL BE SENT TO YOUR REFERRING PHYSICIAN.  Received 2 hours of hydration with NS. Follow-up as scheduled. Call clinic for any questions or concerns  Thank you for choosing [place] at [facility] to provide your oncology and hematology care.  To afford each patient quality time with our provider, please arrive at least 15 minutes before your scheduled appointment time.    If you have a lab appointment with the [facility] please come in thru the  Main Entrance and check in at the main information desk  You need to re-schedule your appointment should you arrive 10 or more minutes late.  We strive to give you quality time with our providers, and arriving late affects you and other patients whose appointments are after yours.  Also, if you no show three or more times for appointments you may be dismissed from the clinic at the providers discretion.     Again, thank you for choosing [facility].  Our hope is that these requests will decrease the amount of time that you wait before being seen by our physicians.       _____________________________________________________________  Should you have questions after your visit to [facility], please contact our office at [phone] between the hours of 8:30 a.m. and 4:30 p.m.  Voicemails left after 4:30 p.m. will not be returned until the following business day.  For prescription refill requests, have your pharmacy contact our office.       Resources For Cancer Patients and their Caregivers ? American Cancer Society: Can assist with transportation, wigs, general needs, runs Look Good Feel Better.        (305) 403-2908 ? Cancer Care: Provides financial assistance, online support groups, medication/co-pay assistance.  1-800-813-HOPE 9592549163) ? [place] Assists [place] cancer patients and their families through emotional , educational and financial support.  850 527 6050 ? [place] DSS Where to apply for food stamps, Medicaid and utility assistance. 857-034-7480 ? RCATS: Transportation to medical appointments. 973-451-8858 ? Social Security Administration: May apply for disability if have a Stage IV cancer. 973-643-7033 340 181 2956 ? [employer], Disability and Transit Services: Assists with nutrition, care and transit needs. [place] Support Programs: @10RELATIVEDAYS @ > Cancer Support Group  2nd Tuesday of the month 1pm-2pm, Journey Room  > Creative Journey  3rd Tuesday of the month 1130am-1pm, Journey Room  > Look Good Feel Better  1st Wednesday of the month 10am-12 noon, Journey Room (Call [place] to register [phone])

## 2016-12-24 ENCOUNTER — Ambulatory Visit (HOSPITAL_COMMUNITY)
Admission: RE | Admit: 2016-12-24 | Discharge: 2016-12-24 | Disposition: A | Discharge status: Home / Self Care | Payer: Medicare Other | Source: Ambulatory Visit | Attending: Adult Health | Admitting: Adult Health

## 2016-12-24 ENCOUNTER — Encounter (HOSPITAL_COMMUNITY): Payer: Self-pay

## 2016-12-24 ENCOUNTER — Encounter (HOSPITAL_BASED_OUTPATIENT_CLINIC_OR_DEPARTMENT_OTHER): Payer: Medicare Other

## 2016-12-24 VITALS — BP 131/63 | HR 82 | Temp 98.0°F | Resp 17 | Wt 114.4 lb

## 2016-12-24 DIAGNOSIS — N6489 Other specified disorders of breast: Secondary | ICD-10-CM | POA: Insufficient documentation

## 2016-12-24 DIAGNOSIS — C786 Secondary malignant neoplasm of retroperitoneum and peritoneum: Secondary | ICD-10-CM

## 2016-12-24 DIAGNOSIS — E876 Hypokalemia: Secondary | ICD-10-CM | POA: Diagnosis not present

## 2016-12-24 DIAGNOSIS — R180 Malignant ascites: Secondary | ICD-10-CM | POA: Diagnosis not present

## 2016-12-24 DIAGNOSIS — C481 Malignant neoplasm of specified parts of peritoneum: Secondary | ICD-10-CM

## 2016-12-24 DIAGNOSIS — N1330 Unspecified hydronephrosis: Secondary | ICD-10-CM | POA: Insufficient documentation

## 2016-12-24 DIAGNOSIS — C801 Malignant (primary) neoplasm, unspecified: Secondary | ICD-10-CM | POA: Diagnosis not present

## 2016-12-24 MED ORDER — IOPAMIDOL (ISOVUE-300) INJECTION 61%
INTRAVENOUS | Status: AC
  Filled 2016-12-24: qty 30

## 2016-12-24 MED ORDER — HEPARIN SOD (PORK) LOCK FLUSH 100 UNIT/ML IV SOLN
500.0000 [IU] | Freq: Once | INTRAVENOUS | Status: AC
  Administered 2016-12-24: 500 [IU] via INTRAVENOUS

## 2016-12-24 MED ORDER — IOPAMIDOL (ISOVUE-300) INJECTION 61%
100.0000 mL | Freq: Once | INTRAVENOUS | Status: AC | PRN
  Administered 2016-12-24: 100 mL via INTRAVENOUS

## 2016-12-24 MED ORDER — SODIUM CHLORIDE 0.9 % IV SOLN
INTRAVENOUS | Status: DC
  Administered 2016-12-24: 10:00:00 via INTRAVENOUS

## 2016-12-24 NOTE — Progress Notes (Signed)
Tolerated infusion w/o adverse reaction.  Alert, in no distress.  VSS.  Discharged ambulatory.  

## 2016-12-27 ENCOUNTER — Other Ambulatory Visit: Payer: Medicare Other

## 2016-12-27 ENCOUNTER — Encounter: Payer: Self-pay | Admitting: Gynecologic Oncology

## 2016-12-27 ENCOUNTER — Other Ambulatory Visit (HOSPITAL_COMMUNITY): Payer: Self-pay | Admitting: Adult Health

## 2016-12-27 ENCOUNTER — Ambulatory Visit: Payer: Medicare Other | Attending: Gynecologic Oncology | Admitting: Gynecologic Oncology

## 2016-12-27 VITALS — BP 133/70 | HR 63 | Temp 98.2°F | Resp 20 | Wt 119.3 lb

## 2016-12-27 DIAGNOSIS — C801 Malignant (primary) neoplasm, unspecified: Secondary | ICD-10-CM

## 2016-12-27 DIAGNOSIS — Z79899 Other long term (current) drug therapy: Secondary | ICD-10-CM | POA: Insufficient documentation

## 2016-12-27 DIAGNOSIS — K219 Gastro-esophageal reflux disease without esophagitis: Secondary | ICD-10-CM | POA: Diagnosis not present

## 2016-12-27 DIAGNOSIS — E7800 Pure hypercholesterolemia, unspecified: Secondary | ICD-10-CM | POA: Diagnosis not present

## 2016-12-27 DIAGNOSIS — Z7982 Long term (current) use of aspirin: Secondary | ICD-10-CM | POA: Diagnosis not present

## 2016-12-27 DIAGNOSIS — E039 Hypothyroidism, unspecified: Secondary | ICD-10-CM | POA: Insufficient documentation

## 2016-12-27 DIAGNOSIS — Z90710 Acquired absence of both cervix and uterus: Secondary | ICD-10-CM | POA: Diagnosis not present

## 2016-12-27 DIAGNOSIS — Z7984 Long term (current) use of oral hypoglycemic drugs: Secondary | ICD-10-CM | POA: Diagnosis not present

## 2016-12-27 DIAGNOSIS — I10 Essential (primary) hypertension: Secondary | ICD-10-CM | POA: Diagnosis not present

## 2016-12-27 DIAGNOSIS — E119 Type 2 diabetes mellitus without complications: Secondary | ICD-10-CM | POA: Diagnosis not present

## 2016-12-27 DIAGNOSIS — R5381 Other malaise: Secondary | ICD-10-CM | POA: Diagnosis not present

## 2016-12-27 DIAGNOSIS — E43 Unspecified severe protein-calorie malnutrition: Secondary | ICD-10-CM

## 2016-12-27 DIAGNOSIS — D509 Iron deficiency anemia, unspecified: Secondary | ICD-10-CM | POA: Diagnosis not present

## 2016-12-27 DIAGNOSIS — C786 Secondary malignant neoplasm of retroperitoneum and peritoneum: Secondary | ICD-10-CM

## 2016-12-27 DIAGNOSIS — Z9221 Personal history of antineoplastic chemotherapy: Secondary | ICD-10-CM

## 2016-12-27 DIAGNOSIS — C481 Malignant neoplasm of specified parts of peritoneum: Secondary | ICD-10-CM

## 2016-12-27 NOTE — Progress Notes (Signed)
Consult Note: Gyn-Onc  Consult was requested by Robynn Pane PA for the evaluation of Sherry Hopkins 76 y.o. female  CC:  Chief Complaint  Patient presents with  . Peritoneal carcinomatosis [facility])    Assessment/Plan:  Sherry Hopkins  is a 76 y.o.  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 She has severe protein malnutrition and debilitation from her disease however has had some improvements in both nutritional status and performance status s/p 3 cycles neoadjuvant chemotherapy (day 1 of cycle 3 was 12/15/16). On imaging she has had some modest response in the tumor, though she still has a bulky omental cake and on pelvic exam still has a bulky pelvic mass. Due to this, she is not a candidate for MIS surgery for interval debulking. Instead I offered her ex lap, BSO, omentectomy, radical tumor debulking, possible bowel resection. I discussed that there is still major risk associated with surgery, including delayed healing that results in slow return to chemotherapy which could adversely effect outcomes from her cancer. However, if we do not perform surgery at this juncture, I feel that we are determining a non-curable intent course to therapy. The patient and her daughter are still interested in curative intent therapy therefore will proceed with surgery on 01/04/17.  Risks discussed including  bleeding, infection, damage to internal organs (such as bladder,ureters, bowels), blood clot, reoperation and rehospitalization.  She has not had CA 125 drawn during her treatments and so we will assess this today.  HPI: Sherry Hopkins is a 76 year old P2 who is seen in consultation at the request of Robynn Pane PA for peritoneal carcinomatosis.  The patient began feeling abdominal distension in May, 2018. She was seen by an urgent care and a CT chest was normal. She was treated for reflux with medical management. Her symptoms did not resolve.  She was  taken to the [facility] ED on 10/07/16 with persistent symptoms of dyspepsia, abdominal distension, anorexia, weight loss, reflux, and occasional emesis.  A CT abd/pelvis was performed which showed evidence of extensive peritoneal carcinomatosis ncluding large volume of loculated ascites, omental caking, and calcified peritoneal deposits. The patient is status post hysterectomy. If the patient still has ovaries then this would be a likely origin. Alternatively GI malignancies may present with peritoneal carcinomatosis. At this time there is no evidence for high-grade bowel obstruction or obstructive uropathy.  She CA 125 drawn on 10/11/16 which was elevated at 4,702.  Of note, total albumin was decreased at 2.9 indicating severe malnutrition.  CT was performed of the chest on 10/19/16 which was unremarkable for evidence of metastatic disease.  She is an otherwise fairly healthy woman with a history of hypertension, diet controlled DM and hypothyroidism. She has had a prior abdominal hysterectomy (not oophorectomy) for abnormal bleeding in her 40's.  She was deemed to not be a good operative candidate for primary debulking due to her very poor nutritional and performance status and a plan was made for neoadjuvant chemotherapy with 3 cycles of carb/tax.  Interval Hx:  Day 1 of cycle 1 was 11/03/16 and day 1 of cycle 3 was 12/15/16. Dr Shaune Pollack admniistered chemotherapy at [facility]. CA 125 was not assessed during therapy.  During treatment she regained some strength and appetite, though she still remains fairly weak. She required IV fluids during the off weaks.   CT scan chest/abdo/pelvis on 12/24/16 showed small volume malignant ascites, decreased. For example, there is malignant ascites along the left hepatic  lobe and stomach along the hepatic renal fossa and in the right dependent pelvis adjacent to the rectum.Calcified omental caking beneath the midline anterior abdominal Hopkins, measuring 3.4 x 13.2 cm  grossly unchanged.  Albumin on 12/15/16: 3.4 (lower limit normal 3.5).  Current Meds:  Outpatient Encounter Prescriptions as of 12/27/2016  Medication Sig  . aspirin EC 81 MG tablet Take 81 mg by mouth every morning.  . Cholecalciferol (VITAMIN D PO) Take 1 tablet by mouth daily.  . enalapril (VASOTEC) 5 MG tablet Take 5 mg by mouth every morning.  . furosemide (LASIX) 40 MG tablet Take 1 tablet (40 mg total) by mouth daily as needed. (Patient taking differently: Take 40 mg by mouth daily as needed for fluid or edema. )  . levothyroxine (SYNTHROID, LEVOTHROID) 25 MCG tablet Take 25 mcg by mouth daily before breakfast.  . lidocaine-prilocaine (EMLA) cream Apply a quarter size amount to affected area 1 hour prior to coming to chemotherapy.  Marland Kitchen LORazepam (ATIVAN) 0.5 MG tablet Take 1 tablet (0.5 mg total) by mouth every 8 (eight) hours. (Patient taking differently: Take 0.5 mg by mouth every 8 (eight) hours as needed for anxiety. )  . megestrol (MEGACE) 40 MG/ML suspension Take 2 mLs (80 mg total) by mouth 2 (two) times daily.  . metFORMIN (GLUCOPHAGE-XR) 500 MG 24 hr tablet Take 500 mg by mouth daily with supper.   . naproxen sodium (ANAPROX) 220 MG tablet Take 220 mg by mouth daily as needed (pain).   Marland Kitchen omeprazole (PRILOSEC) 20 MG capsule Take 1 capsule (20 mg total) by mouth daily.  . ondansetron (ZOFRAN ODT) 4 MG disintegrating tablet 4mg  ODT q4 hours prn nausea/vomit  . ondansetron (ZOFRAN-ODT) 8 MG disintegrating tablet TAKE 1 TABLET (8 MG TOTAL) BY MOUTH EVERY 8 (EIGHT) HOURS AS NEEDED FOR NAUSEA OR VOMITING.  . polyethylene glycol powder (GLYCOLAX/MIRALAX) powder Take 1 capful daily. (Patient taking differently: Take 17 g by mouth daily. Take 1 capful daily. 1 capful = 17g)  . Potassium Chloride ER 20 MEQ TBCR Take 20 mEq by mouth 2 (two) times daily.  . pravastatin (PRAVACHOL) 80 MG tablet Take 80 mg by mouth daily.   . prochlorperazine (COMPAZINE) 10 MG tablet Take 1 tablet (10 mg total)  by mouth every 6 (six) hours as needed for nausea or vomiting.  Orlie Dakin Sodium (SENOKOT S PO) Take 1 tablet by mouth at bedtime.  Marland Kitchen spironolactone (ALDACTONE) 25 MG tablet Take 25 mg by mouth 2 (two) times daily.  . traMADol (ULTRAM) 50 MG tablet Take 1 tablet (50 mg total) by mouth every 6 (six) hours as needed. (Patient taking differently: Take 50 mg by mouth every 6 (six) hours as needed (pain). )  . valsartan (DIOVAN) 80 MG tablet Take 80 mg by mouth daily.   No facility-administered encounter medications on file as of 12/27/2016.     Allergy: No Known Allergies  Social Hx:   Social History   Social History  . Marital status: Divorced    Spouse name: N/A  . Number of children: N/A  . Years of education: N/A   Occupational History  . Not on file.   Social History Main Topics  . Smoking status: Never Smoker  . Smokeless tobacco: Never Used  . Alcohol use No  . Drug use: No  . Sexual activity: Yes    Birth control/ protection: Surgical   Other Topics Concern  . Not on file   Social History Narrative  . No narrative  on file    Past Surgical Hx:  Past Surgical History:  Procedure Laterality Date  . ABDOMINAL HYSTERECTOMY    . CATARACT EXTRACTION W/PHACO  08/23/2011   Procedure: CATARACT EXTRACTION PHACO AND INTRAOCULAR LENS PLACEMENT (IOC);  Surgeon: Tonny Branch, MD;  Location: [facility];  Service: Ophthalmology;  Laterality: Left;  CDE:15.60  . CATARACT EXTRACTION W/PHACO Right 08/16/2013   Procedure: CATARACT EXTRACTION PHACO AND INTRAOCULAR LENS PLACEMENT (IOC);  Surgeon: Tonny Branch, MD;  Location: [facility];  Service: Ophthalmology;  Laterality: Right;  CDE:8.18  . PORTACATH PLACEMENT Left 11/01/2016   Procedure: INSERTION PORT-A-CATH LEFT SUBCLAVIAN;  Surgeon: Aviva Signs, MD;  Location: [facility];  Service: General;  Laterality: Left;    Past Medical Hx:  Past Medical History:  Diagnosis Date  . Diabetes mellitus type 2, diet-controlled (HCC)    diet  controlled  . Hypercholesteremia   . Hypertension   . Hypothyroidism   . Iron deficiency anemia 10/12/2016  . Peritoneal carcinomatosis ([place]) 10/11/2016    Past Gynecological History:  SVD x 2 No LMP recorded. Patient has had a hysterectomy.  Family Hx: History reviewed. No pertinent family history.  Review of Systems:  Constitutional  Feels fatigued and unwell,    ENT Normal appearing ears and nares bilaterally.  Skin/Breast  No rash, sores, jaundice, itching, dryness Cardiovascular  No chest pain, shortness of breath, or edema  Pulmonary  No cough or wheeze.  Gastro Intestinal  + anorexia, + decreased BM, no emesis  Genito Urinary  No frequency, urgency, dysuria,  Musculo Skeletal  No myalgia, arthralgia, joint swelling or pain  Neurologic  No weakness, numbness, change in gait,  Psychology  No depression, anxiety, insomnia.   Vitals:  Blood pressure 133/70, pulse 63, temperature 98.2 F (36.8 C), temperature source Oral, resp. rate 20, weight 119 lb 4.8 oz (54.1 kg), SpO2 100 %.  Physical Exam: WD Sunken cheeks Neck  Supple NROM, without any enlargements.  Lymph Node Survey No cervical supraclavicular or inguinal adenopathy Cardiovascular  Pulse normal rate, regularity and rhythm. S1 and S2 normal.  Lungs  Clear to auscultation bilateraly, without wheezes/crackles/rhonchi. Good air movement.  Skin  No rash/lesions/breakdown  Psychiatry  Alert and oriented to person, place, and time  Abdomen  Normoactive bowel sounds, abdomen soft, non-tender and thin, slightly distended with fluid without evidence of hernia. Firm mass in lower abdomen consistent with omental cake. Back No CVA tenderness Genito Urinary  Vulva/vagina: Normal external female genitalia.   No lesions. No discharge or bleeding.  Bladder/urethra:  No lesions or masses, well supported bladder  Vagina: normal  Adnexa: irregular nodular masses filling pelvis . Rectal  + cul de sac nodularity.   Extremities  No bilateral cyanosis, clubbing or edema.   Donaciano Eva, MD  12/27/2016, 11:42 AM

## 2016-12-27 NOTE — Patient Instructions (Signed)
Preparing for your Surgery  Plan for surgery on January 04, 2017 with Dr. Everitt Amber at [place] will be scheduled for an exploratory laparotomy, bilateral salpingo-oophorectomy, omentectomy, radical tumor debulking, possible bowel resection.  Pre-operative Testing -You will receive a phone call from presurgical testing at [facility] to arrange for a pre-operative testing appointment before your surgery.  This appointment normally occurs one to two weeks before your scheduled surgery.   -Bring your insurance card, copy of an advanced directive if applicable, medication list  -At that visit, you will be asked to sign a consent for a possible blood transfusion in case a transfusion becomes necessary during surgery.  The need for a blood transfusion is rare but having consent is a necessary part of your care.     -You should not be taking blood thinners or aspirin at least ten days prior to surgery unless instructed by your surgeon.  Day Before Surgery at [place] will be asked to take in a light diet the day before surgery.  Avoid carbonated beverages.  You will be advised to have nothing to eat or drink after midnight the evening before.    Eat a light diet the day before surgery.  Examples including soups, broths, toast, yogurt, mashed potatoes.  Things to avoid include carbonated beverages (fizzy beverages), raw fruits and raw vegetables, or beans.   If your bowels are filled with gas, your surgeon will have difficulty visualizing your pelvic organs which increases your surgical risks.  STARTING AT 4PM THE DAY BEFORE SURGERY, BEGIN DRINKING TWO BOTTLES OF MAGNESIUM CITRATE AND ONLY TAKE IN CLEAR LIQUIDS.  Your role in recovery Your role is to become active as soon as directed by your doctor, while still giving yourself time to heal.  Rest when you feel tired. You will be asked to do the following in order to speed your recovery:  - Cough and  breathe deeply. This helps toclear and expand your lungs and can prevent pneumonia. You may be given a spirometer to practice deep breathing. A staff member will show you how to use the spirometer. - Do mild physical activity. Walking or moving your legs help your circulation and body functions return to normal. A staff member will help you when you try to walk and will provide you with simple exercises. Do not try to get up or walk alone the first time. - Actively manage your pain. Managing your pain lets you move in comfort. We will ask you to rate your pain on a scale of zero to 10. It is your responsibility to tell your doctor or nurse where and how much you hurt so your pain can be treated.  Special Considerations -If you are diabetic, you may be placed on insulin after surgery to have closer control over your blood sugars to promote healing and recovery.  This does not mean that you will be discharged on insulin.  If applicable, your oral antidiabetics will be resumed when you are tolerating a solid diet.  -Your final pathology results from surgery should be available by the Friday after surgery and the results will be relayed to you when available.  -Dr. Lahoma Crocker is the Surgeon that assists your GYN Oncologist with surgery.  The next day after your surgery you will either see your GYN Oncologist or Dr. Lahoma Crocker.   Blood Transfusion Information WHAT IS A BLOOD TRANSFUSION? A transfusion is the replacement of blood or some of its parts.  Blood is made up of multiple cells which provide different functions.  Red blood cells carry oxygen and are used for blood loss replacement.  White blood cells fight against infection.  Platelets control bleeding.  Plasma helps clot blood.  Other blood products are available for specialized needs, such as hemophilia or other clotting disorders. BEFORE THE TRANSFUSION  Who gives blood for transfusions?   You may be able to donate  blood to be used at a later date on yourself (autologous donation).  Relatives can be asked to donate blood. This is generally not any safer than if you have received blood from a stranger. The same precautions are taken to ensure safety when a relative's blood is donated.  Healthy volunteers who are fully evaluated to make sure their blood is safe. This is blood bank blood. Transfusion therapy is the safest it has ever been in the practice of medicine. Before blood is taken from a donor, a complete history is taken to make sure that person has no history of diseases nor engages in risky social behavior (examples are intravenous drug use or sexual activity with multiple partners). The donor's travel history is screened to minimize risk of transmitting infections, such as malaria. The donated blood is tested for signs of infectious diseases, such as HIV and hepatitis. The blood is then tested to be sure it is compatible with you in order to minimize the chance of a transfusion reaction. If you or a relative donates blood, this is often done in anticipation of surgery and is not appropriate for emergency situations. It takes many days to process the donated blood. RISKS AND COMPLICATIONS Although transfusion therapy is very safe and saves many lives, the main dangers of transfusion include:   Getting an infectious disease.  Developing a transfusion reaction. This is an allergic reaction to something in the blood you were given. Every precaution is taken to prevent this. The decision to have a blood transfusion has been considered carefully by your caregiver before blood is given. Blood is not given unless the benefits outweigh the risks.

## 2016-12-28 LAB — CA 125: Cancer Antigen (CA) 125: 1449 U/mL — ABNORMAL HIGH (ref 0.0–38.1)

## 2016-12-28 NOTE — Patient Instructions (Signed)
Sherry Hopkins  12/28/2016   Your procedure is scheduled on: 01/04/17  Report to [facility] Main  Entrance Take Blanket  elevators to 3rd floor to  [place] at      [phone].    Call this number if you have problems the morning of surgery [phone]    Remember: ONLY 1 PERSON MAY GO WITH YOU TO SHORT STAY TO GET  READY MORNING OF YOUR SURGERY.              EAT A LIGHT DIET THE DAY BEFORE YOUR SURGERY EXAMPLES ARE: TOAST SOUPS BROTH MASHED POTATOES YOGURT AVOID RAW FRUITS AND VEGGIES AND CARBONATED BEVERAGES . AT 4 PM BEGIN DRINKING 2 BOTTLES OF MAGNESIUM CITRATE AND BEGIN CLEAR LIQUID DIET.   Do not eat food or drink liquids :After Midnight.     Take these medicines the morning of surgery with A SIP OF WATER: PREVASTATIN, OMEPRAZOLE, ATIVAN AS NEEDED, LEVOTHYROXINE DO NOT TAKE ANY DIABETIC MEDICATIONS DAY OF YOUR SURGERY                               You may not have any metal on your body including hair pins and              piercings  Do not wear jewelry, make-up, lotions, powders or perfumes, deodorant             Do not wear nail polish.  Do not shave  48 hours prior to surgery.                Do not bring valuables to the hospital. [place].  Contacts, dentures or bridgework may not be worn into surgery.  Leave suitcase in the car. After surgery it may be brought to your room.               Please read over the following fact sheets you were given: _____________________________________________________________________           [facility] - Preparing for Surgery Before surgery, you can play an important role.  Because skin is not sterile, your skin needs to be as free of germs as possible.  You can reduce the number of germs on your skin by washing with CHG (chlorahexidine gluconate) soap before surgery.  CHG is an antiseptic cleaner which kills germs and bonds with the skin to continue  killing germs even after washing. Please DO NOT use if you have an allergy to CHG or antibacterial soaps.  If your skin becomes reddened/irritated stop using the CHG and inform your nurse when you arrive at Short Stay. Do not shave (including legs and underarms) for at least 48 hours prior to the first CHG shower.  You may shave your face/neck. Please follow these instructions carefully:  1.  Shower with CHG Soap the night before surgery and the  morning of Surgery.  2.  If you choose to wash your hair, wash your hair first as usual with your  normal  shampoo.  3.  After you shampoo, rinse your hair and body thoroughly to remove the  shampoo.  4.  Use CHG as you would any other liquid soap.  You can apply chg directly  to the skin and wash                       Gently with a scrungie or clean washcloth.  5.  Apply the CHG Soap to your body ONLY FROM THE NECK DOWN.   Do not use on face/ open                           Wound or open sores. Avoid contact with eyes, ears mouth and genitals (private parts).                       Wash face,  Genitals (private parts) with your normal soap.             6.  Wash thoroughly, paying special attention to the area where your surgery  will be performed.  7.  Thoroughly rinse your body with warm water from the neck down.  8.  DO NOT shower/wash with your normal soap after using and rinsing off  the CHG Soap.                9.  Pat yourself dry with a clean towel.            10.  Wear clean pajamas.            11.  Place clean sheets on your bed the night of your first shower and do not  sleep with pets. Day of Surgery : Do not apply any lotions/deodorants the morning of surgery.  Please wear clean clothes to the hospital/surgery center.  FAILURE TO FOLLOW THESE INSTRUCTIONS MAY RESULT IN THE CANCELLATION OF YOUR SURGERY PATIENT SIGNATURE_________________________________  NURSE  SIGNATURE__________________________________  ________________________________________________________________________    CLEAR LIQUID DIET  START AT 4PM THE DAY PRIOR TO SURGERY WHEN YOU BEGIN MAGNESIUM CITRATE   Foods Allowed                                                                     Foods Excluded  Coffee and tea, regular and decaf                             liquids that you cannot  Plain Jell-O in any flavor                                             see through such as: Fruit ices (not with fruit pulp)                                     milk, soups, orange juice  Iced Popsicles                                    All solid food Carbonated  beverages, regular and diet                                    Cranberry, grape and apple juices Sports drinks like Gatorade Lightly seasoned clear broth or consume(fat free) Sugar, honey syrup  Sample Menu Breakfast                                Lunch                                     Supper Cranberry juice                    Beef broth                            Chicken broth Jell-O                                     Grape juice                           Apple juice Coffee or tea                        Jell-O                                      Popsicle                                                Coffee or tea                        Coffee or tea  _____________________________________________________________________   WHAT IS A BLOOD TRANSFUSION? Blood Transfusion Information  A transfusion is the replacement of blood or some of its parts. Blood is made up of multiple cells which provide different functions.  Red blood cells carry oxygen and are used for blood loss replacement.  White blood cells fight against infection.  Platelets control bleeding.  Plasma helps clot blood.  Other blood products are available for specialized needs, such as hemophilia or other clotting disorders. BEFORE THE TRANSFUSION  Who gives  blood for transfusions?   Healthy volunteers who are fully evaluated to make sure their blood is safe. This is blood bank blood. Transfusion therapy is the safest it has ever been in the practice of medicine. Before blood is taken from a donor, a complete history is taken to make sure that person has no history of diseases nor engages in risky social behavior (examples are intravenous drug use or sexual activity with multiple partners). The donor's travel history is screened to minimize risk of transmitting infections, such as malaria. The donated blood is tested for signs of infectious diseases, such as HIV and hepatitis. The blood is then tested to be sure it is compatible with you in order  to minimize the chance of a transfusion reaction. If you or a relative donates blood, this is often done in anticipation of surgery and is not appropriate for emergency situations. It takes many days to process the donated blood. RISKS AND COMPLICATIONS Although transfusion therapy is very safe and saves many lives, the main dangers of transfusion include:   Getting an infectious disease.  Developing a transfusion reaction. This is an allergic reaction to something in the blood you were given. Every precaution is taken to prevent this. The decision to have a blood transfusion has been considered carefully by your caregiver before blood is given. Blood is not given unless the benefits outweigh the risks. AFTER THE TRANSFUSION  Right after receiving a blood transfusion, you will usually feel much better and more energetic. This is especially true if your red blood cells have gotten low (anemic). The transfusion raises the level of the red blood cells which carry oxygen, and this usually causes an energy increase.  The nurse administering the transfusion will monitor you carefully for complications. HOME CARE INSTRUCTIONS  No special instructions are needed after a transfusion. You may find your energy is better.  Speak with your caregiver about any limitations on activity for underlying diseases you may have. SEEK MEDICAL CARE IF:   Your condition is not improving after your transfusion.  You develop redness or irritation at the intravenous (IV) site. SEEK IMMEDIATE MEDICAL CARE IF:  Any of the following symptoms occur over the next 12 hours:  Shaking chills.  You have a temperature by mouth above 102 F (38.9 C), not controlled by medicine.  Chest, back, or muscle pain.  People around you feel you are not acting correctly or are confused.  Shortness of breath or difficulty breathing.  Dizziness and fainting.  You get a rash or develop hives.  You have a decrease in urine output.  Your urine turns a dark color or changes to pink, red, or brown. Any of the following symptoms occur over the next 10 days:  You have a temperature by mouth above 102 F (38.9 C), not controlled by medicine.  Shortness of breath.  Weakness after normal activity.  The white part of the eye turns yellow (jaundice).  You have a decrease in the amount of urine or are urinating less often.  Your urine turns a dark color or changes to pink, red, or brown. Document Released: 02/27/2000 Document Revised: 05/24/2011 Document Reviewed: 10/16/2007 ExitCare Patient Information 2014 [place].  _______________________________________________________________________  Incentive Spirometer  An incentive spirometer is a tool that can help keep your lungs clear and active. This tool measures how well you are filling your lungs with each breath. Taking long deep breaths may help reverse or decrease the chance of developing breathing (pulmonary) problems (especially infection) following:  A long period of time when you are unable to move or be active. BEFORE THE PROCEDURE   If the spirometer includes an indicator to show your best effort, your nurse or respiratory therapist will set it to a desired goal.  If  possible, sit up straight or lean slightly forward. Try not to slouch.  Hold the incentive spirometer in an upright position. INSTRUCTIONS FOR USE  1. Sit on the edge of your bed if possible, or sit up as far as you can in bed or on a chair. 2. Hold the incentive spirometer in an upright position. 3. Breathe out normally. 4. Place the mouthpiece in your mouth and seal your lips tightly  around it. 5. Breathe in slowly and as deeply as possible, raising the piston or the ball toward the top of the column. 6. Hold your breath for 3-5 seconds or for as long as possible. Allow the piston or ball to fall to the bottom of the column. 7. Remove the mouthpiece from your mouth and breathe out normally. 8. Rest for a few seconds and repeat Steps 1 through 7 at least 10 times every 1-2 hours when you are awake. Take your time and take a few normal breaths between deep breaths. 9. The spirometer may include an indicator to show your best effort. Use the indicator as a goal to work toward during each repetition. 10. After each set of 10 deep breaths, practice coughing to be sure your lungs are clear. If you have an incision (the cut made at the time of surgery), support your incision when coughing by placing a pillow or rolled up towels firmly against it. Once you are able to get out of bed, walk around indoors and cough well. You may stop using the incentive spirometer when instructed by your caregiver.  RISKS AND COMPLICATIONS  Take your time so you do not get dizzy or light-headed.  If you are in pain, you may need to take or ask for pain medication before doing incentive spirometry. It is harder to take a deep breath if you are having pain. AFTER USE  Rest and breathe slowly and easily.  It can be helpful to keep track of a log of your progress. Your caregiver can provide you with a simple table to help with this. If you are using the spirometer at home, follow these instructions: [place]  IF:   You are having difficultly using the spirometer.  You have trouble using the spirometer as often as instructed.  Your pain medication is not giving enough relief while using the spirometer.  You develop fever of 100.5 F (38.1 C) or higher. SEEK IMMEDIATE MEDICAL CARE IF:   You cough up bloody sputum that had not been present before.  You develop fever of 102 F (38.9 C) or greater.  You develop worsening pain at or near the incision site. MAKE SURE YOU:   Understand these instructions.  Will watch your condition.  Will get help right away if you are not doing well or get worse. Document Released: 07/12/2006 Document Revised: 05/24/2011 Document Reviewed: 09/12/2006 [facility] Patient Information 2014 [place], [place].   ________________________________________________________________________

## 2016-12-29 ENCOUNTER — Encounter (HOSPITAL_BASED_OUTPATIENT_CLINIC_OR_DEPARTMENT_OTHER): Payer: Medicare Other

## 2016-12-29 ENCOUNTER — Encounter (HOSPITAL_COMMUNITY): Payer: Self-pay | Admitting: Oncology

## 2016-12-29 ENCOUNTER — Encounter (HOSPITAL_BASED_OUTPATIENT_CLINIC_OR_DEPARTMENT_OTHER): Payer: Medicare Other | Admitting: Oncology

## 2016-12-29 VITALS — BP 118/59 | HR 77 | Temp 98.4°F | Resp 18

## 2016-12-29 VITALS — BP 121/70 | HR 78 | Resp 16 | Ht 63.0 in | Wt 121.0 lb

## 2016-12-29 DIAGNOSIS — C481 Malignant neoplasm of specified parts of peritoneum: Secondary | ICD-10-CM

## 2016-12-29 DIAGNOSIS — C786 Secondary malignant neoplasm of retroperitoneum and peritoneum: Secondary | ICD-10-CM

## 2016-12-29 DIAGNOSIS — C801 Malignant (primary) neoplasm, unspecified: Principal | ICD-10-CM

## 2016-12-29 DIAGNOSIS — E876 Hypokalemia: Secondary | ICD-10-CM

## 2016-12-29 DIAGNOSIS — R634 Abnormal weight loss: Secondary | ICD-10-CM | POA: Diagnosis not present

## 2016-12-29 DIAGNOSIS — I10 Essential (primary) hypertension: Secondary | ICD-10-CM

## 2016-12-29 DIAGNOSIS — E039 Hypothyroidism, unspecified: Secondary | ICD-10-CM

## 2016-12-29 MED ORDER — SODIUM CHLORIDE 0.9 % IV SOLN
INTRAVENOUS | Status: AC
  Administered 2016-12-29: 10:00:00 via INTRAVENOUS

## 2016-12-29 MED ORDER — SODIUM CHLORIDE 0.9% FLUSH
10.0000 mL | INTRAVENOUS | Status: DC | PRN
  Administered 2016-12-29: 10 mL via INTRAVENOUS
  Filled 2016-12-29: qty 10

## 2016-12-29 MED ORDER — HEPARIN SOD (PORK) LOCK FLUSH 100 UNIT/ML IV SOLN
500.0000 [IU] | Freq: Once | INTRAVENOUS | Status: AC
  Administered 2016-12-29: 500 [IU] via INTRAVENOUS
  Filled 2016-12-29: qty 5

## 2016-12-29 NOTE — Progress Notes (Signed)
Kerrin Champagne tolerated hydration with NS over 2 hrs well without complaints or incident. VSS upon discharge. Pt discharged self ambulatory in satisfactory condition

## 2016-12-29 NOTE — Patient Instructions (Addendum)
[place] at [facility] Discharge Instructions  RECOMMENDATIONS MADE BY THE CONSULTANT AND ANY TEST RESULTS WILL BE SENT TO YOUR REFERRING PHYSICIAN.  Received Hydration with NS over 2 hrs today. Follow-up as scheduled. Call clinic for any questions or concerns  Thank you for choosing [place] at [facility] to provide your oncology and hematology care.  To afford each patient quality time with our provider, please arrive at least 15 minutes before your scheduled appointment time.    If you have a lab appointment with the [facility] please come in thru the  Main Entrance and check in at the main information desk  You need to re-schedule your appointment should you arrive 10 or more minutes late.  We strive to give you quality time with our providers, and arriving late affects you and other patients whose appointments are after yours.  Also, if you no show three or more times for appointments you may be dismissed from the clinic at the providers discretion.     Again, thank you for choosing [facility].  Our hope is that these requests will decrease the amount of time that you wait before being seen by our physicians.       _____________________________________________________________  Should you have questions after your visit to [facility], please contact our office at [phone] between the hours of 8:30 a.m. and 4:30 p.m.  Voicemails left after 4:30 p.m. will not be returned until the following business day.  For prescription refill requests, have your pharmacy contact our office.       Resources For Cancer Patients and their Caregivers ? American Cancer Society: Can assist with transportation, wigs, general needs, runs Look Good Feel Better.        (661)773-7141 ? Cancer Care: Provides financial assistance, online support groups, medication/co-pay assistance.  1-800-813-HOPE 360-865-7651) ? [facility] Assists [place] cancer patients and their families through emotional , educational and financial support.  (207) 339-4998 ? [place] DSS Where to apply for food stamps, Medicaid and utility assistance. 331-482-4456 ? [facility]: Transportation to medical appointments. 214-330-3863 ? Social Security Administration: May apply for disability if have a Stage IV cancer. 757-779-0179 763 770 2399 ? [employer], Disability and Transit Services: Assists with nutrition, care and transit needs. [place] Support Programs: @10RELATIVEDAYS @ > Cancer Support Group  2nd Tuesday of the month 1pm-2pm, Journey Room  > Creative Journey  3rd Tuesday of the month 1130am-1pm, Journey Room  > Look Good Feel Better  1st Wednesday of the month 10am-12 noon, Journey Room (Call [place] to register [phone])

## 2016-12-29 NOTE — Progress Notes (Signed)
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 Serita Grit note on 10/20/16)  Sherry Hopkins is a 76 y.o. female with a medical history significant for hypertension, hypothyroidism, type 2 diabetes, GERD who is referred to the [facility] for peritoneal carcinomatosis.  Patient reported emergency department on 10/07/2016 with abdominal pain. She reported at that time that it started approximately 1 week ago.. This is accompanied by nausea with vomiting. She notes 5-10 emeses per day. Emeses has the appearance of stomach contents. At that time, she denied any fevers.  In the emergency department, CT imaging of abdomen and pelvis was performed.   --- Sherry Hopkins  is a 76 y.o.  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 She has severe protein malnutrition and debilitation from her disease.   Given that Sherry Hopkins is of such poor performance status and nutritional status I do not think that she is a good candidate for primary upfront debulking surgery.  Instead I am recommending neoadjuvant chemotherapy with 3 cycles of carboplatin and paclitaxel followed by repeat imaging and interval debulking 3 weeks after cycle 3 should she demonstrate good response to therapy and improved underlying health.  S/p port-a-cath placement on 11/01/16 with Dr. Arnoldo Morale; procedure went well.   She is here today for supportive care with IVF.  She started chemo with Carbo/Taxol on 11/03/16. She had ED visit on 11/06/16 for N&V; reports that her nausea symptoms are now resolved.  She was given IVF and Zofran  in the ED and symptoms improved before she was discharged home in stable condition.     INTERVAL HISTORY:  Sherry Hopkins 76 y.o. female returns for routine follow-up for ovarian cancer/primary peritoneal cancer.   Patient completed cycle 3 carboplatin and taxol on 12/15/16. Restaging CT C/A/P on 12/24/16 demonstrated decrease in malignant ascites, but calcified omental caking beneath the anterior abdominal wall, grossly unchanged. She has seen Dr. Denman George in follow up on 12/27/16 and she had discussed with the patient and her daughter about curative intent surgery with ex lap, BSO, omentectomy, radical tumor debulking, possible bowel resection and the patient is agreeable to proceeding. Surgery is scheduled for 01/04/17.   She is here for IVF today. She states that her appetite is improved after starting megace. She has gained 2 lbs over the past 2 days. Fatigue is mildly improved. She denies any chest pain, shortness breath, abdominal pain, focal weakness, nausea, vomiting, diarrhea.    REVIEW OF SYSTEMS:  Review of Systems  Constitutional: Positive for fatigue. Negative for appetite change, chills, fever and unexpected weight change.  HENT:  Negative.  Negative for mouth sores and nosebleeds.   Eyes: Negative.   Respiratory: Negative.   Cardiovascular: Negative.   Gastrointestinal: Negative for abdominal pain, constipation, diarrhea, nausea and vomiting.  Endocrine: Negative.   Genitourinary: Negative.  Negative for dysuria and hematuria.   Neurological: Negative for dizziness, extremity weakness and headaches.  Hematological: Negative.   Psychiatric/Behavioral: The patient is not nervous/anxious.      PAST MEDICAL/SURGICAL HISTORY:  Past Medical History:  Diagnosis Date  . Diabetes mellitus type 2,  diet-controlled ([place])    diet controlled  . Hypercholesteremia   . Hypertension   . Hypothyroidism   . Iron deficiency anemia 10/12/2016  . Peritoneal carcinomatosis ([place]) 10/11/2016    Past Surgical History:  Procedure Laterality Date  . ABDOMINAL HYSTERECTOMY    . CATARACT EXTRACTION W/PHACO  08/23/2011   Procedure: CATARACT EXTRACTION PHACO AND INTRAOCULAR LENS PLACEMENT (IOC);  Surgeon: Tonny Branch, MD;  Location: [facility];  Service: Ophthalmology;  Laterality: Left;  CDE:15.60  . CATARACT EXTRACTION W/PHACO Right 08/16/2013   Procedure: CATARACT EXTRACTION PHACO AND INTRAOCULAR LENS PLACEMENT (IOC);  Surgeon: Tonny Branch, MD;  Location: [facility];  Service: Ophthalmology;  Laterality: Right;  CDE:8.18  . PORTACATH PLACEMENT Left 11/01/2016   Procedure: INSERTION PORT-A-CATH LEFT SUBCLAVIAN;  Surgeon: Aviva Signs, MD;  Location: [facility];  Service: General;  Laterality: Left;     SOCIAL HISTORY:  Social History   Social History  . Marital status: Divorced    Spouse name: N/A  . Number of children: N/A  . Years of education: N/A   Occupational History  . Not on file.   Social History Main Topics  . Smoking status: Never Smoker  . Smokeless tobacco: Never Used  . Alcohol use No  . Drug use: No  . Sexual activity: Yes    Birth control/ protection: Surgical   Other Topics Concern  . Not on file   Social History Narrative  . No narrative on file    FAMILY HISTORY:  No family history on file.  CURRENT MEDICATIONS:  Outpatient Encounter Prescriptions as of 12/29/2016  Medication Sig Note  . aspirin EC 81 MG tablet Take 81 mg by mouth every morning.   . Cholecalciferol (VITAMIN D PO) Take 1 tablet by mouth daily.   . enalapril (VASOTEC) 5 MG tablet Take 5 mg by mouth every morning. 06/11/5186: Patient is uncertain if she is still taking this medication or not  . furosemide (LASIX) 40 MG tablet Take 1 tablet (40 mg total) by mouth daily as needed. (Patient taking differently: Take 40 mg by mouth daily as needed for fluid or edema. )   . levothyroxine (SYNTHROID, LEVOTHROID) 25 MCG tablet Take 25 mcg by mouth daily before breakfast.   . lidocaine-prilocaine  (EMLA) cream Apply a quarter size amount to affected area 1 hour prior to coming to chemotherapy.   Marland Kitchen LORazepam (ATIVAN) 0.5 MG tablet Take 1 tablet (0.5 mg total) by mouth every 8 (eight) hours. (Patient taking differently: Take 0.5 mg by mouth every 8 (eight) hours as needed for anxiety. )   . megestrol (MEGACE) 40 MG/ML suspension Take 2 mLs (80 mg total) by mouth 2 (two) times daily.   . metFORMIN (GLUCOPHAGE-XR) 500 MG 24 hr tablet Take 500 mg by mouth daily with supper.    . naproxen sodium (ANAPROX) 220 MG tablet Take 220 mg by mouth daily as needed (pain).    Marland Kitchen omeprazole (PRILOSEC) 20 MG capsule Take 1 capsule (20 mg total) by mouth daily. 10/29/2016: Sometimes forgets to take.  . ondansetron (ZOFRAN ODT) 4 MG disintegrating tablet 4mg  ODT q4 hours prn nausea/vomit   . ondansetron (ZOFRAN-ODT) 8 MG disintegrating tablet TAKE 1 TABLET (8 MG TOTAL) BY MOUTH EVERY 8 (EIGHT) HOURS AS NEEDED FOR NAUSEA OR VOMITING.   . polyethylene glycol powder (GLYCOLAX/MIRALAX) powder Take 1 capful daily. (Patient taking differently: Take 17 g by mouth daily. Take 1 capful daily. 1 capful = 17g)   . Potassium Chloride ER 20 MEQ  TBCR Take 20 mEq by mouth 2 (two) times daily.   . pravastatin (PRAVACHOL) 80 MG tablet Take 80 mg by mouth daily.    . prochlorperazine (COMPAZINE) 10 MG tablet Take 1 tablet (10 mg total) by mouth every 6 (six) hours as needed for nausea or vomiting.   Orlie Dakin Sodium (SENOKOT S PO) Take 1 tablet by mouth at bedtime.   Marland Kitchen spironolactone (ALDACTONE) 25 MG tablet Take 25 mg by mouth 2 (two) times daily. 10/29/2016: Forgets the second dose often.  . traMADol (ULTRAM) 50 MG tablet Take 1 tablet (50 mg total) by mouth every 6 (six) hours as needed. (Patient taking differently: Take 50 mg by mouth every 6 (six) hours as needed (pain). )   . valsartan (DIOVAN) 80 MG tablet Take 80 mg by mouth daily.    No facility-administered encounter medications on file as of 12/29/2016.      ALLERGIES:  No Known Allergies   PHYSICAL EXAM:  ECOG Performance status: 2-3 - Symptomatic; requires assistance  Vital signs blood pressure 111/65 pulse 80 respiration 18 temp 98.6 O2 sat 96%.   Physical Exam  Constitutional: She is oriented to person, place, and time.  Thin frail female in no acute distress   HENT:  Head: Normocephalic.  Mouth/Throat: Oropharynx is clear and moist.  Eyes: Conjunctivae are normal. No scleral icterus.  Neck: Normal range of motion. Neck supple.  Cardiovascular: Normal rate and regular rhythm.   Pulmonary/Chest: Effort normal and breath sounds normal. No respiratory distress.  Abdominal: Soft. Bowel sounds are normal. There is no tenderness.  Firm lower abdominal mass palpated.  Musculoskeletal: Normal range of motion. She exhibits no edema.  Lymphadenopathy:    She has no cervical adenopathy.  Neurological: She is alert and oriented to person, place, and time.  Skin: Skin is warm and dry. No rash noted.  Psychiatric: Judgment normal. Her mood appears anxious. She has a flat affect.  Appears slightly anxious; does not make eye contact often. Mildly flat affect.   Nursing note and vitals reviewed.    LABORATORY DATA:  I have reviewed the labs as listed.  CBC    Component Value Date/Time   WBC 5.1 12/15/2016 0956   RBC 3.39 (L) 12/15/2016 0956   HGB 8.9 (L) 12/15/2016 0956   HCT 28.6 (L) 12/15/2016 0956   PLT 332 12/15/2016 0956   MCV 84.4 12/15/2016 0956   MCH 26.3 12/15/2016 0956   MCHC 31.1 12/15/2016 0956   RDW 20.1 (H) 12/15/2016 0956   LYMPHSABS 1.7 12/15/2016 0956   MONOABS 0.5 12/15/2016 0956   EOSABS 0.0 12/15/2016 0956   BASOSABS 0.0 12/15/2016 0956   CMP Latest Ref Rng & Units 12/15/2016 11/24/2016 11/10/2016  Glucose 65 - 99 mg/dL 112(H) 97 111(H)  BUN 6 - 20 mg/dL 8 9 13   Creatinine 0.44 - 1.00 mg/dL 0.53 0.72 0.70  Sodium 135 - 145 mmol/L 139 140 133(L)  Potassium 3.5 - 5.1 mmol/L 3.3(L) 3.5 3.5  Chloride 101 -  111 mmol/L 100(L) 97(L) 92(L)  CO2 22 - 32 mmol/L 31 34(H) 30  Calcium 8.9 - 10.3 mg/dL 8.8(L) 9.2 9.1  Total Protein 6.5 - 8.1 g/dL 6.5 7.4 7.6  Total Bilirubin 0.3 - 1.2 mg/dL 0.3 0.4 0.7  Alkaline Phos 38 - 126 U/L 57 67 72  AST 15 - 41 U/L 25 23 24   ALT 14 - 54 U/L 14 16 21     PENDING LABS:    DIAGNOSTIC IMAGING:  *The following  radiologic images and reports have been reviewed independently and agree with below findings.  CT abd/pelvis: 10/07/16 CLINICAL DATA:  Abdominal pain  EXAM: CT ABDOMEN AND PELVIS WITH CONTRAST  TECHNIQUE: Multidetector CT imaging of the abdomen and pelvis was performed using the standard protocol following bolus administration of intravenous contrast.  CONTRAST:  113mL ISOVUE-300 IOPAMIDOL (ISOVUE-300) INJECTION 61%  COMPARISON:  None.  FINDINGS: Lower chest: No acute abnormality.  Hepatobiliary: No focal liver abnormality. The gallbladder is normal. No biliary dilatation.  Pancreas: There is a normal appearance of the pancreas.  Spleen: Spleen is unremarkable  Adrenals/Urinary Tract: The adrenal glands both appear normal. 1 cm cyst arises from the midpole of left kidney. Urinary bladder is not well seen.  Stomach/Bowel: Moderate size hiatal hernia. No pathologic dilatation of the large or small bowel loops.  Vascular/Lymphatic: Aortic atherosclerosis. No enlarged retroperitoneal lymph nodes. No pelvic or inguinal adenopathy.  Reproductive: The uterus is surgically absent. The adnexal structures are not well visualized.  Other: There is a marked volume of complex and loculated ascites within the abdomen and pelvis. There is evidence of extensive peritoneal carcinomatosis with omental cake measuring 14.1 x 2.5 by 6.9 cm. There are numerous calcified peritoneal deposits identified predominantly involving the dependent portions of the peritoneal cavity.  Musculoskeletal: No acute or significant osseous  findings.  IMPRESSION: 1. There is evidence of extensive peritoneal carcinomatosis including large volume of loculated ascites, omental caking, and calcified peritoneal deposits. The patient is status post hysterectomy. If the patient still has ovaries then this would be a likely origin. Alternatively GI malignancies may present with peritoneal carcinomatosis. Oncologic consultation is advised. 2. At this time there is no evidence for high-grade bowel obstruction or obstructive uropathy.   Electronically Signed   By: Kerby Moors M.D.   On: 10/07/2016 16:35   CT chest: 10/18/16 CLINICAL DATA:  Peritoneal carcinomatosis.  EXAM: CT CHEST WITHOUT CONTRAST  TECHNIQUE: Multidetector CT imaging of the chest was performed following the standard protocol without IV contrast.  COMPARISON:  Abdomen pelvis CT from 10/07/2016  FINDINGS: Cardiovascular: The heart is enlarged. No substantial pericardial effusion. Coronary artery calcification is noted. Atherosclerotic calcification is noted in the wall of the thoracic aorta.  Mediastinum/Nodes: No mediastinal lymphadenopathy. Index precarinal lymph node is 9 mm in short axis. No evidence for gross hilar lymphadenopathy although assessment is limited by the lack of intravenous contrast on today's study. Tiny hiatal hernia. Esophagus otherwise unremarkable. There is no axillary lymphadenopathy.  Lungs/Pleura: Subpleural atelectasis noted in the lung bases. No suspicious pulmonary nodule or mass. No focal airspace consolidation. No pulmonary edema. Tiny left pleural effusion noted.  Upper Abdomen: Intraperitoneal fluid again evident, better documented on the recent abdomen and pelvis CT.  Musculoskeletal: Bone windows reveal no worrisome lytic or sclerotic osseous lesions.  IMPRESSION: 1. Tiny left pleural effusion with basilar atelectasis in both lungs. No suspicious pulmonary nodule or mass. 2.  Emphysema.  (ICD10-J43.9) 3.  Aortic Atherosclerois (ICD10-170.0)   Electronically Signed   By: Misty Stanley M.D.   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 Evaluated by Gyn-Onc (Dr. Denman George) who felt that patient was not a candidate for upfront debulking surgery d/t poor performance status and protein/calorie malnutrition. S/p 3 cycles of carbo/taxol, cycle 3 given on 12/15/16. -Restaging CT C/A/P on 12/24/16 demonstrated decrease  in malignant ascites, but calcified omental caking beneath the anterior abdominal wall, grossly unchanged. Her scan results were reviewed with the patient. -She has seen Dr. Denman George in follow up on 12/27/16 and she had discussed with the patient and her daughter about curative intent surgery with ex lap, BSO, omentectomy, radical tumor debulking, possible bowel resection and the patient is agreeable to proceeding. Surgery is scheduled for 01/04/17.   Weight loss:  -Continue megace for appetite stimulation. She's gained 2 lbs over the past 2 days. -Encouraged her to increase oral intake and maintain hydration.   Dispo:  -Return to cancer center in 8 weeks for follow-up, she will need time to recover from her surgery.    All questions were answered to patient's stated satisfaction. Encouraged patient to call with any new concerns or questions before her next visit to the cancer center and we can certain see her sooner, if needed.    Twana First M.D.

## 2016-12-30 ENCOUNTER — Encounter (HOSPITAL_COMMUNITY): Payer: Self-pay

## 2016-12-30 ENCOUNTER — Encounter (HOSPITAL_COMMUNITY)
Admission: RE | Admit: 2016-12-30 | Discharge: 2016-12-30 | Disposition: A | Discharge status: Home / Self Care | Payer: Medicare Other | Source: Ambulatory Visit | Attending: Gynecologic Oncology | Admitting: Gynecologic Oncology

## 2016-12-30 DIAGNOSIS — Z0183 Encounter for blood typing: Secondary | ICD-10-CM | POA: Insufficient documentation

## 2016-12-30 DIAGNOSIS — Z7982 Long term (current) use of aspirin: Secondary | ICD-10-CM | POA: Diagnosis not present

## 2016-12-30 DIAGNOSIS — Z79899 Other long term (current) drug therapy: Secondary | ICD-10-CM | POA: Diagnosis not present

## 2016-12-30 DIAGNOSIS — Z01812 Encounter for preprocedural laboratory examination: Secondary | ICD-10-CM | POA: Diagnosis not present

## 2016-12-30 DIAGNOSIS — C569 Malignant neoplasm of unspecified ovary: Secondary | ICD-10-CM | POA: Diagnosis not present

## 2016-12-30 DIAGNOSIS — D509 Iron deficiency anemia, unspecified: Secondary | ICD-10-CM | POA: Diagnosis not present

## 2016-12-30 DIAGNOSIS — E119 Type 2 diabetes mellitus without complications: Secondary | ICD-10-CM | POA: Diagnosis not present

## 2016-12-30 DIAGNOSIS — Z7984 Long term (current) use of oral hypoglycemic drugs: Secondary | ICD-10-CM | POA: Insufficient documentation

## 2016-12-30 DIAGNOSIS — E7800 Pure hypercholesterolemia, unspecified: Secondary | ICD-10-CM | POA: Diagnosis not present

## 2016-12-30 DIAGNOSIS — E039 Hypothyroidism, unspecified: Secondary | ICD-10-CM | POA: Insufficient documentation

## 2016-12-30 DIAGNOSIS — Z90710 Acquired absence of both cervix and uterus: Secondary | ICD-10-CM | POA: Diagnosis not present

## 2016-12-30 DIAGNOSIS — I10 Essential (primary) hypertension: Secondary | ICD-10-CM | POA: Diagnosis not present

## 2016-12-30 HISTORY — DX: Gastro-esophageal reflux disease without esophagitis: K21.9

## 2016-12-30 LAB — URINALYSIS, ROUTINE W REFLEX MICROSCOPIC
BILIRUBIN URINE: NEGATIVE
GLUCOSE, UA: NEGATIVE mg/dL
HGB URINE DIPSTICK: NEGATIVE
KETONES UR: NEGATIVE mg/dL
NITRITE: NEGATIVE
PH: 5 (ref 5.0–8.0)
Protein, ur: NEGATIVE mg/dL
Specific Gravity, Urine: 1.013 (ref 1.005–1.030)

## 2016-12-30 LAB — COMPREHENSIVE METABOLIC PANEL
ALK PHOS: 56 U/L (ref 38–126)
ALT: 14 U/L (ref 14–54)
AST: 19 U/L (ref 15–41)
Albumin: 3.7 g/dL (ref 3.5–5.0)
Anion gap: 8 (ref 5–15)
BUN: 10 mg/dL (ref 6–20)
CALCIUM: 9 mg/dL (ref 8.9–10.3)
CHLORIDE: 105 mmol/L (ref 101–111)
CO2: 26 mmol/L (ref 22–32)
CREATININE: 0.51 mg/dL (ref 0.44–1.00)
GFR calc Af Amer: >60 mL/min (ref >60)
GFR calc non Af Amer: >60 mL/min (ref >60)
GLUCOSE: 103 mg/dL — AB (ref 65–99)
Potassium: 3.7 mmol/L (ref 3.5–5.1)
SODIUM: 139 mmol/L (ref 135–145)
Total Bilirubin: 0.5 mg/dL (ref 0.3–1.2)
Total Protein: 7.6 g/dL (ref 6.5–8.1)

## 2016-12-30 LAB — CBC
HCT: 27.8 % — ABNORMAL LOW (ref 36.0–46.0)
Hemoglobin: 8.7 g/dL — ABNORMAL LOW (ref 12.0–15.0)
MCH: 26.4 pg (ref 26.0–34.0)
MCHC: 31.3 g/dL (ref 30.0–36.0)
MCV: 84.5 fL (ref 78.0–100.0)
PLATELETS: 327 10*3/uL (ref 150–400)
RBC: 3.29 MIL/uL — ABNORMAL LOW (ref 3.87–5.11)
RDW: 22.2 % — AB (ref 11.5–15.5)
WBC: 3.1 10*3/uL — AB (ref 4.0–10.5)

## 2016-12-30 LAB — HEMOGLOBIN A1C
Hgb A1c MFr Bld: 5.7 % — ABNORMAL HIGH (ref 4.8–5.6)
Mean Plasma Glucose: 116.89 mg/dL

## 2016-12-30 LAB — GLUCOSE, CAPILLARY: GLUCOSE-CAPILLARY: 87 mg/dL (ref 65–99)

## 2016-12-30 NOTE — Progress Notes (Signed)
CBC done 12/30/16 routed to Dr. Denman George and Joylene John, NP

## 2016-12-30 NOTE — Progress Notes (Signed)
ekg 10-28-16 epic 12-15-16 labs epic  OV Dr. Yong Channel 11/17/16 chart cxr 11/01/16 epic

## 2016-12-31 ENCOUNTER — Encounter (HOSPITAL_COMMUNITY): Payer: Medicare Other

## 2016-12-31 ENCOUNTER — Encounter (HOSPITAL_BASED_OUTPATIENT_CLINIC_OR_DEPARTMENT_OTHER): Payer: Medicare Other

## 2016-12-31 DIAGNOSIS — E876 Hypokalemia: Secondary | ICD-10-CM | POA: Diagnosis not present

## 2016-12-31 DIAGNOSIS — C786 Secondary malignant neoplasm of retroperitoneum and peritoneum: Secondary | ICD-10-CM | POA: Diagnosis not present

## 2016-12-31 LAB — URINE CULTURE: CULTURE: NO GROWTH

## 2016-12-31 LAB — ABO/RH: ABO/RH(D): O POS

## 2016-12-31 MED ORDER — SODIUM CHLORIDE 0.9% FLUSH
10.0000 mL | INTRAVENOUS | Status: DC | PRN
  Administered 2016-12-31: 10 mL via INTRAVENOUS
  Filled 2016-12-31: qty 10

## 2016-12-31 MED ORDER — HEPARIN SOD (PORK) LOCK FLUSH 100 UNIT/ML IV SOLN
500.0000 [IU] | Freq: Once | INTRAVENOUS | Status: AC
  Administered 2016-12-31: 500 [IU] via INTRAVENOUS

## 2016-12-31 MED ORDER — SODIUM CHLORIDE 0.9 % IV SOLN
INTRAVENOUS | Status: DC
  Administered 2016-12-31: 11:00:00 via INTRAVENOUS

## 2016-12-31 NOTE — Progress Notes (Signed)
Nutrition Follow-up:  Nutrition follow-up completed during infusion for fluids this am.  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    Met with patient this am.  No family members with her today.  Patient reports that she drank an ensure plus this am before coming to clinic.  Reports that she ate 3 ckicken wings from [facility], coleslaw and macaroni and cheese last night for dinner.  Reports not eating lunch yesterday as she was at MD appointments getting ready for her surgery.  Reports she ate cereal for breakfast yesterday am.  Reports that she is continuing to drink water and gatorade and gingerale.    Reports that her surgery is next week.  Noted planning exploratory lap with BSO, omentectomy, radical tumor debulking and possible bowel resection.     Medications:reviewed   Labs: reviewed  Anthropometrics:   Weight increased to 121 lb on 10/18 from 119 lb 12.8 oz on 10/3.     NUTRITION DIAGNOSIS: Inadequate oral intake continues   MALNUTRITION DIAGNOSIS: severe malnutrition continues   INTERVENTION:   Provided 3rd case of ensure plus today. Provided patient with soft moist protein foods handout today and reviewed it in detail with her.  Encouraged her to share with daughter to have these foods on hand before and after surgery.   Discussed importance of nutrition with upcoming surgery on wound healing and recovery.      MONITORING, EVALUATION, GOAL: weight trends, intake   NEXT VISIT: phone follow-up after 12/13 office visit.  Danyeal Akens B. Zenia Resides, [place], [place] Registered Dietitian [phone] (pager)

## 2016-12-31 NOTE — Patient Instructions (Signed)
[facility] at [facility] Discharge Instructions  RECOMMENDATIONS MADE BY THE CONSULTANT AND ANY TEST RESULTS WILL BE SENT TO YOUR REFERRING PHYSICIAN.  IVF given today Follow up as scheduled.  Thank you for choosing [facility] at [facility] to provide your oncology and hematology care.  To afford each patient quality time with our provider, please arrive at least 15 minutes before your scheduled appointment time.    If you have a lab appointment with the Cancer Center please come in thru the  Main Entrance and check in at the main information desk  You need to re-schedule your appointment should you arrive 10 or more minutes late.  We strive to give you quality time with our providers, and arriving late affects you and other patients whose appointments are after yours.  Also, if you no show three or more times for appointments you may be dismissed from the clinic at the providers discretion.     Again, thank you for choosing [facility].  Our hope is that these requests will decrease the amount of time that you wait before being seen by our physicians.       _____________________________________________________________  Should you have questions after your visit to [facility], please contact our office at [phone] between the hours of 8:30 a.m. and 4:30 p.m.  Voicemails left after 4:30 p.m. will not be returned until the following business day.  For prescription refill requests, have your pharmacy contact our office.       Resources For Cancer Patients and their Caregivers ? American Cancer Society: Can assist with transportation, wigs, general needs, runs Look Good Feel Better.        1-888-227-6333 ? Cancer Care: Provides financial assistance, online support groups, medication/co-pay assistance.  1-800-813-HOPE (4673) ? [facility] Assists [place] cancer patients and their  families through emotional , educational and financial support.  336-427-4357 ? [place] DSS Where to apply for food stamps, Medicaid and utility assistance. 336-342-1394 ? [facility]: Transportation to medical appointments. 336-347-2287 ? Social Security Administration: May apply for disability if have a Stage IV cancer. 336-342-7796 1-800-772-1213 ? [place] Aging, Disability and Transit Services: Assists with nutrition, care and transit needs. 336-349-2343  Cancer Center Support Programs: @10RELATIVEDAYS@ > Cancer Support Group  2nd Tuesday of the month 1pm-2pm, Journey Room  > Creative Journey  3rd Tuesday of the month 1130am-1pm, Journey Room  > Look Good Feel Better  1st Wednesday of the month 10am-12 noon, Journey Room (Call American Cancer Society to register 1-800-395-5775)   

## 2016-12-31 NOTE — Progress Notes (Signed)
Treatment given per orders. Patient tolerated it well without problems. Vitals stable and discharged home from clinic ambulatory. Follow up as scheduled.  

## 2017-01-04 ENCOUNTER — Inpatient Hospital Stay (HOSPITAL_COMMUNITY): Payer: Medicare Other | Admitting: Anesthesiology

## 2017-01-04 ENCOUNTER — Encounter (HOSPITAL_COMMUNITY): Payer: Self-pay | Admitting: *Deleted

## 2017-01-04 ENCOUNTER — Encounter (HOSPITAL_COMMUNITY)
Admission: RE | Disposition: A | Discharge status: Home Health | Payer: Self-pay | Source: Ambulatory Visit | Attending: Gynecologic Oncology

## 2017-01-04 ENCOUNTER — Telehealth: Payer: Self-pay | Admitting: *Deleted

## 2017-01-04 ENCOUNTER — Inpatient Hospital Stay (HOSPITAL_COMMUNITY)
Admission: RE | Admit: 2017-01-04 | Discharge: 2017-01-08 | DRG: 356 | Disposition: A | Discharge status: Home Health | Payer: Medicare Other | Source: Ambulatory Visit | Attending: Gynecologic Oncology | Admitting: Gynecologic Oncology

## 2017-01-04 DIAGNOSIS — Z9221 Personal history of antineoplastic chemotherapy: Secondary | ICD-10-CM

## 2017-01-04 DIAGNOSIS — Z7984 Long term (current) use of oral hypoglycemic drugs: Secondary | ICD-10-CM

## 2017-01-04 DIAGNOSIS — Z7982 Long term (current) use of aspirin: Secondary | ICD-10-CM

## 2017-01-04 DIAGNOSIS — I10 Essential (primary) hypertension: Secondary | ICD-10-CM | POA: Diagnosis present

## 2017-01-04 DIAGNOSIS — Z90710 Acquired absence of both cervix and uterus: Secondary | ICD-10-CM

## 2017-01-04 DIAGNOSIS — E119 Type 2 diabetes mellitus without complications: Secondary | ICD-10-CM | POA: Diagnosis present

## 2017-01-04 DIAGNOSIS — K219 Gastro-esophageal reflux disease without esophagitis: Secondary | ICD-10-CM | POA: Diagnosis present

## 2017-01-04 DIAGNOSIS — R5381 Other malaise: Secondary | ICD-10-CM | POA: Diagnosis present

## 2017-01-04 DIAGNOSIS — C482 Malignant neoplasm of peritoneum, unspecified: Principal | ICD-10-CM | POA: Diagnosis present

## 2017-01-04 DIAGNOSIS — E039 Hypothyroidism, unspecified: Secondary | ICD-10-CM | POA: Diagnosis present

## 2017-01-04 DIAGNOSIS — E43 Unspecified severe protein-calorie malnutrition: Secondary | ICD-10-CM | POA: Diagnosis present

## 2017-01-04 DIAGNOSIS — C569 Malignant neoplasm of unspecified ovary: Secondary | ICD-10-CM | POA: Diagnosis present

## 2017-01-04 DIAGNOSIS — Z79899 Other long term (current) drug therapy: Secondary | ICD-10-CM | POA: Diagnosis not present

## 2017-01-04 DIAGNOSIS — E7800 Pure hypercholesterolemia, unspecified: Secondary | ICD-10-CM | POA: Diagnosis present

## 2017-01-04 DIAGNOSIS — D6481 Anemia due to antineoplastic chemotherapy: Secondary | ICD-10-CM

## 2017-01-04 DIAGNOSIS — C801 Malignant (primary) neoplasm, unspecified: Secondary | ICD-10-CM

## 2017-01-04 DIAGNOSIS — C562 Malignant neoplasm of left ovary: Secondary | ICD-10-CM

## 2017-01-04 DIAGNOSIS — G893 Neoplasm related pain (acute) (chronic): Secondary | ICD-10-CM

## 2017-01-04 DIAGNOSIS — C481 Malignant neoplasm of specified parts of peritoneum: Secondary | ICD-10-CM | POA: Diagnosis present

## 2017-01-04 DIAGNOSIS — D509 Iron deficiency anemia, unspecified: Secondary | ICD-10-CM | POA: Diagnosis present

## 2017-01-04 DIAGNOSIS — Z95828 Presence of other vascular implants and grafts: Secondary | ICD-10-CM

## 2017-01-04 DIAGNOSIS — C786 Secondary malignant neoplasm of retroperitoneum and peritoneum: Secondary | ICD-10-CM | POA: Diagnosis present

## 2017-01-04 DIAGNOSIS — T451X5A Adverse effect of antineoplastic and immunosuppressive drugs, initial encounter: Secondary | ICD-10-CM

## 2017-01-04 HISTORY — PX: LAPAROTOMY: SHX154

## 2017-01-04 LAB — GLUCOSE, CAPILLARY
GLUCOSE-CAPILLARY: 82 mg/dL (ref 65–99)
GLUCOSE-CAPILLARY: 126 mg/dL — AB (ref 65–99)
Glucose-Capillary: 63 mg/dL — ABNORMAL LOW (ref 65–99)
Glucose-Capillary: 139 mg/dL — ABNORMAL HIGH (ref 65–99)

## 2017-01-04 LAB — PREPARE RBC (CROSSMATCH)

## 2017-01-04 SURGERY — LAPAROTOMY, EXPLORATORY
Anesthesia: General

## 2017-01-04 MED ORDER — ROCURONIUM BROMIDE 10 MG/ML (PF) SYRINGE
PREFILLED_SYRINGE | INTRAVENOUS | Status: DC | PRN
  Administered 2017-01-04: 10 mg via INTRAVENOUS
  Administered 2017-01-04: 40 mg via INTRAVENOUS

## 2017-01-04 MED ORDER — TRAMADOL HCL 50 MG PO TABS
100.0000 mg | ORAL_TABLET | Freq: Two times a day (BID) | ORAL | Status: DC | PRN
  Administered 2017-01-05 – 2017-01-07 (×2): 100 mg via ORAL
  Filled 2017-01-04 (×2): qty 2

## 2017-01-04 MED ORDER — BUPIVACAINE HCL 0.25 % IJ SOLN
INTRAMUSCULAR | Status: DC | PRN
  Administered 2017-01-04: 20 mL

## 2017-01-04 MED ORDER — ONDANSETRON HCL 4 MG/2ML IJ SOLN
4.0000 mg | Freq: Four times a day (QID) | INTRAMUSCULAR | Status: DC | PRN
  Administered 2017-01-05: 4 mg via INTRAVENOUS
  Filled 2017-01-04 (×3): qty 2

## 2017-01-04 MED ORDER — BUPIVACAINE LIPOSOME 1.3 % IJ SUSP
20.0000 mL | Freq: Once | INTRAMUSCULAR | Status: AC
  Administered 2017-01-04: 20 mL
  Filled 2017-01-04: qty 20

## 2017-01-04 MED ORDER — ALBUMIN HUMAN 5 % IV SOLN
INTRAVENOUS | Status: DC | PRN
Start: 2017-01-04 — End: 2017-01-04
  Administered 2017-01-04: 12:00:00 via INTRAVENOUS

## 2017-01-04 MED ORDER — DEXAMETHASONE SODIUM PHOSPHATE 10 MG/ML IJ SOLN
INTRAMUSCULAR | Status: DC | PRN
  Administered 2017-01-04: 5 mg via INTRAVENOUS

## 2017-01-04 MED ORDER — HYDROMORPHONE HCL 1 MG/ML IJ SOLN
0.5000 mg | INTRAMUSCULAR | Status: DC | PRN
  Administered 2017-01-05: 0.5 mg via INTRAVENOUS
  Filled 2017-01-04: qty 1

## 2017-01-04 MED ORDER — PRAVASTATIN SODIUM 20 MG PO TABS
80.0000 mg | ORAL_TABLET | Freq: Every day | ORAL | Status: DC
  Administered 2017-01-06 – 2017-01-08 (×3): 80 mg via ORAL
  Filled 2017-01-04 (×3): qty 4

## 2017-01-04 MED ORDER — ONDANSETRON HCL 4 MG/2ML IJ SOLN
INTRAMUSCULAR | Status: DC | PRN
  Administered 2017-01-04: 4 mg via INTRAVENOUS

## 2017-01-04 MED ORDER — SENNOSIDES-DOCUSATE SODIUM 8.6-50 MG PO TABS
2.0000 | ORAL_TABLET | Freq: Every day | ORAL | Status: DC
  Administered 2017-01-04 – 2017-01-07 (×4): 2 via ORAL
  Filled 2017-01-04 (×4): qty 2

## 2017-01-04 MED ORDER — DEXTROSE 5 % IV SOLN
2.0000 g | INTRAVENOUS | Status: AC
  Administered 2017-01-04: 2 g via INTRAVENOUS
  Filled 2017-01-04: qty 2

## 2017-01-04 MED ORDER — 0.9 % SODIUM CHLORIDE (POUR BTL) OPTIME
TOPICAL | Status: DC | PRN
  Administered 2017-01-04: 2000 mL

## 2017-01-04 MED ORDER — INSULIN ASPART 100 UNIT/ML ~~LOC~~ SOLN
0.0000 [IU] | Freq: Three times a day (TID) | SUBCUTANEOUS | Status: DC
  Administered 2017-01-05 (×3): 1 [IU] via SUBCUTANEOUS

## 2017-01-04 MED ORDER — LACTATED RINGERS IV SOLN
INTRAVENOUS | Status: DC
  Administered 2017-01-04 (×2): via INTRAVENOUS

## 2017-01-04 MED ORDER — ACETAMINOPHEN 500 MG PO TABS
1000.0000 mg | ORAL_TABLET | Freq: Two times a day (BID) | ORAL | Status: DC
  Administered 2017-01-05 – 2017-01-08 (×7): 1000 mg via ORAL
  Filled 2017-01-04 (×7): qty 2

## 2017-01-04 MED ORDER — PROMETHAZINE HCL 25 MG/ML IJ SOLN: 6.2500 mg | INTRAMUSCULAR | Status: DC | PRN

## 2017-01-04 MED ORDER — IBUPROFEN 800 MG PO TABS
800.0000 mg | ORAL_TABLET | Freq: Four times a day (QID) | ORAL | Status: DC
  Administered 2017-01-05 – 2017-01-08 (×14): 800 mg via ORAL
  Filled 2017-01-04 (×14): qty 1

## 2017-01-04 MED ORDER — HYDROMORPHONE HCL 1 MG/ML IJ SOLN
INTRAMUSCULAR | Status: AC
  Filled 2017-01-04: qty 1

## 2017-01-04 MED ORDER — OXYCODONE HCL 5 MG PO TABS
5.0000 mg | ORAL_TABLET | ORAL | Status: DC | PRN
  Administered 2017-01-04: 5 mg via ORAL
  Filled 2017-01-04: qty 1

## 2017-01-04 MED ORDER — GLUCERNA SHAKE PO LIQD
237.0000 mL | Freq: Three times a day (TID) | ORAL | Status: DC
  Administered 2017-01-04 – 2017-01-08 (×9): 237 mL via ORAL
  Filled 2017-01-04 (×12): qty 237

## 2017-01-04 MED ORDER — CHEWING GUM (ORBIT) SUGAR FREE
1.0000 | CHEWING_GUM | Freq: Three times a day (TID) | ORAL | Status: AC
  Administered 2017-01-04 – 2017-01-07 (×9): 1 via ORAL
  Filled 2017-01-04: qty 1

## 2017-01-04 MED ORDER — SODIUM CHLORIDE 0.9 % IJ SOLN
INTRAMUSCULAR | Status: DC | PRN
  Administered 2017-01-04: 20 mL

## 2017-01-04 MED ORDER — BUPIVACAINE HCL (PF) 0.25 % IJ SOLN
INTRAMUSCULAR | Status: AC
  Filled 2017-01-04: qty 30

## 2017-01-04 MED ORDER — PREGABALIN 50 MG PO CAPS
50.0000 mg | ORAL_CAPSULE | Freq: Every day | ORAL | Status: DC
  Administered 2017-01-05 – 2017-01-08 (×4): 50 mg via ORAL
  Filled 2017-01-04 (×4): qty 1

## 2017-01-04 MED ORDER — IRBESARTAN 75 MG PO TABS
75.0000 mg | ORAL_TABLET | Freq: Every day | ORAL | Status: DC
  Administered 2017-01-05 – 2017-01-08 (×4): 75 mg via ORAL
  Filled 2017-01-04 (×4): qty 1

## 2017-01-04 MED ORDER — NON FORMULARY: 1.0000 [IU] | Freq: Three times a day (TID) | Status: DC

## 2017-01-04 MED ORDER — ONDANSETRON HCL 4 MG PO TABS
4.0000 mg | ORAL_TABLET | Freq: Four times a day (QID) | ORAL | Status: DC | PRN
  Administered 2017-01-07: 4 mg via ORAL

## 2017-01-04 MED ORDER — KCL IN DEXTROSE-NACL 20-5-0.45 MEQ/L-%-% IV SOLN
INTRAVENOUS | Status: DC
  Administered 2017-01-04 – 2017-01-06 (×3): via INTRAVENOUS
  Administered 2017-01-07: 1000 mL via INTRAVENOUS
  Filled 2017-01-04 (×5): qty 1000

## 2017-01-04 MED ORDER — LEVOTHYROXINE SODIUM 25 MCG PO TABS
25.0000 ug | ORAL_TABLET | Freq: Every day | ORAL | Status: DC
  Administered 2017-01-05 – 2017-01-08 (×4): 25 ug via ORAL
  Filled 2017-01-04 (×4): qty 1

## 2017-01-04 MED ORDER — HYDROMORPHONE HCL 1 MG/ML IJ SOLN
0.2500 mg | INTRAMUSCULAR | Status: DC | PRN
  Administered 2017-01-04 (×3): 0.5 mg via INTRAVENOUS

## 2017-01-04 MED ORDER — PROPOFOL 10 MG/ML IV BOLUS
INTRAVENOUS | Status: DC | PRN
  Administered 2017-01-04: 80 mg via INTRAVENOUS

## 2017-01-04 MED ORDER — FENTANYL CITRATE (PF) 250 MCG/5ML IJ SOLN
INTRAMUSCULAR | Status: AC
  Filled 2017-01-04: qty 5

## 2017-01-04 MED ORDER — INSULIN ASPART 100 UNIT/ML ~~LOC~~ SOLN: 0.0000 [IU] | Freq: Every day | SUBCUTANEOUS | Status: DC

## 2017-01-04 MED ORDER — LIDOCAINE 2% (20 MG/ML) 5 ML SYRINGE
INTRAMUSCULAR | Status: DC | PRN
  Administered 2017-01-04: 40 mg via INTRAVENOUS

## 2017-01-04 MED ORDER — SODIUM CHLORIDE 0.9 % IJ SOLN
INTRAMUSCULAR | Status: AC
  Filled 2017-01-04: qty 20

## 2017-01-04 MED ORDER — FENTANYL CITRATE (PF) 100 MCG/2ML IJ SOLN
INTRAMUSCULAR | Status: DC | PRN
  Administered 2017-01-04 (×5): 50 ug via INTRAVENOUS

## 2017-01-04 MED ORDER — ENOXAPARIN SODIUM 40 MG/0.4ML ~~LOC~~ SOLN
40.0000 mg | SUBCUTANEOUS | Status: AC
  Administered 2017-01-04: 40 mg via SUBCUTANEOUS
  Filled 2017-01-04: qty 0.4

## 2017-01-04 MED ORDER — LORAZEPAM 0.5 MG PO TABS
0.5000 mg | ORAL_TABLET | Freq: Three times a day (TID) | ORAL | Status: DC | PRN
  Administered 2017-01-05: 0.5 mg via ORAL
  Filled 2017-01-04: qty 1

## 2017-01-04 MED ORDER — SUGAMMADEX SODIUM 200 MG/2ML IV SOLN
INTRAVENOUS | Status: DC | PRN
  Administered 2017-01-04: 125 mg via INTRAVENOUS

## 2017-01-04 MED ORDER — PROPOFOL 10 MG/ML IV BOLUS
INTRAVENOUS | Status: AC
  Filled 2017-01-04: qty 20

## 2017-01-04 MED ORDER — MEGESTROL ACETATE 40 MG/ML PO SUSP
80.0000 mg | Freq: Two times a day (BID) | ORAL | Status: DC
  Administered 2017-01-04 – 2017-01-07 (×7): 80 mg via ORAL
  Administered 2017-01-08: 40 mg via ORAL
  Filled 2017-01-04 (×8): qty 5

## 2017-01-04 SURGICAL SUPPLY — 58 items
ATTRACTOMAT 16X20 MAGNETIC DRP (DRAPES) ×3 IMPLANT
BLADE EXTENDED COATED 6.5IN (ELECTRODE) ×3 IMPLANT
CELLS DAT CNTRL 66122 CELL SVR (MISCELLANEOUS) IMPLANT
CHLORAPREP W/TINT 26ML (MISCELLANEOUS) ×3 IMPLANT
CLIP VESOCCLUDE LG 6/CT (CLIP) ×3 IMPLANT
CLIP VESOCCLUDE MED 6/CT (CLIP) ×3 IMPLANT
CLIP VESOCCLUDE MED LG 6/CT (CLIP) ×3 IMPLANT
CONT SPEC 4OZ CLIKSEAL STRL BL (MISCELLANEOUS) IMPLANT
DERMABOND ADVANCED (GAUZE/BANDAGES/DRESSINGS) ×2
DERMABOND ADVANCED .7 DNX12 (GAUZE/BANDAGES/DRESSINGS) ×4 IMPLANT
DRAPE INCISE IOBAN 66X45 STRL (DRAPES) ×3 IMPLANT
DRAPE WARM FLUID 44X44 (DRAPE) ×3 IMPLANT
DRSG OPSITE POSTOP 4X12 (GAUZE/BANDAGES/DRESSINGS) ×3 IMPLANT
ELECT REM PT RETURN 15FT ADLT (MISCELLANEOUS) ×3 IMPLANT
GAUZE SPONGE 4X4 16PLY XRAY LF (GAUZE/BANDAGES/DRESSINGS) IMPLANT
GLOVE BIO SURGEON STRL SZ 6 (GLOVE) ×6 IMPLANT
GLOVE BIO SURGEON STRL SZ 6.5 (GLOVE) ×6 IMPLANT
GOWN STRL REUS W/ TWL LRG LVL3 (GOWN DISPOSABLE) ×4 IMPLANT
GOWN STRL REUS W/TWL LRG LVL3 (GOWN DISPOSABLE) ×2
HANDLE SUCTION POOLE (INSTRUMENTS) ×2 IMPLANT
HEMOSTAT ARISTA ABSORB 3G PWDR (MISCELLANEOUS) IMPLANT
KIT BASIN OR (CUSTOM PROCEDURE TRAY) ×3 IMPLANT
LIGASURE IMPACT 36 18CM CVD LR (INSTRUMENTS) IMPLANT
LOOP VESSEL MAXI BLUE (MISCELLANEOUS) IMPLANT
NEEDLE HYPO 22GX1.5 SAFETY (NEEDLE) ×6 IMPLANT
NS IRRIG 1000ML POUR BTL (IV SOLUTION) IMPLANT
PACK GENERAL/GYN (CUSTOM PROCEDURE TRAY) ×3 IMPLANT
RELOAD PROXIMATE 75MM BLUE (ENDOMECHANICALS) IMPLANT
RELOAD PROXIMATE TA60MM BLUE (ENDOMECHANICALS) IMPLANT
RETRACTOR WND ALEXIS 25 LRG (MISCELLANEOUS) IMPLANT
RTRCTR WOUND ALEXIS 18CM MED (MISCELLANEOUS)
RTRCTR WOUND ALEXIS 25CM LRG (MISCELLANEOUS)
SHEET LAVH (DRAPES) ×3 IMPLANT
SPOGE SURGIFLO 8M (HEMOSTASIS)
SPONGE LAP 18X18 X RAY DECT (DISPOSABLE) ×3 IMPLANT
SPONGE SURGIFLO 8M (HEMOSTASIS) IMPLANT
STAPLER GUN LINEAR PROX 60 (STAPLE) IMPLANT
STAPLER PROXIMATE 75MM BLUE (STAPLE) IMPLANT
STAPLER VISISTAT 35W (STAPLE) IMPLANT
SUCTION POOLE HANDLE (INSTRUMENTS) ×3
SUT MNCRL AB 4-0 PS2 18 (SUTURE) ×9 IMPLANT
SUT PDS AB 1 TP1 96 (SUTURE) ×6 IMPLANT
SUT SILK 3 0 SH CR/8 (SUTURE) ×3 IMPLANT
SUT VIC AB 0 CT1 36 (SUTURE) ×12 IMPLANT
SUT VIC AB 2-0 CT1 36 (SUTURE) ×6 IMPLANT
SUT VIC AB 2-0 CT2 27 (SUTURE) ×18 IMPLANT
SUT VIC AB 2-0 SH 27 (SUTURE) ×1
SUT VIC AB 2-0 SH 27X BRD (SUTURE) ×2 IMPLANT
SUT VIC AB 3-0 CTX 36 (SUTURE) IMPLANT
SUT VIC AB 3-0 SH 18 (SUTURE) IMPLANT
SUT VIC AB 3-0 SH 27 (SUTURE) ×2
SUT VIC AB 3-0 SH 27X BRD (SUTURE) ×2 IMPLANT
SUT VIC AB 3-0 SH 27XBRD (SUTURE) ×2 IMPLANT
SYR 30ML LL (SYRINGE) ×6 IMPLANT
TOWEL OR 17X26 10 PK STRL BLUE (TOWEL DISPOSABLE) ×3 IMPLANT
TOWEL OR NON WOVEN STRL DISP B (DISPOSABLE) ×3 IMPLANT
TRAY FOLEY W/METER SILVER 16FR (SET/KITS/TRAYS/PACK) ×3 IMPLANT
UNDERPAD 30X30 (UNDERPADS AND DIAPERS) ×3 IMPLANT

## 2017-01-04 NOTE — Anesthesia Procedure Notes (Signed)
Procedure Name: Intubation Performed by: Gean Maidens Pre-anesthesia Checklist: Patient identified, Emergency Drugs available, Suction available, Patient being monitored and Timeout performed Patient Re-evaluated:Patient Re-evaluated prior to induction Oxygen Delivery Method: Circle system utilized Preoxygenation: Pre-oxygenation with 100% oxygen Induction Type: IV induction Ventilation: Mask ventilation without difficulty Laryngoscope Size: Mac and 3 Grade View: Grade I Tube type: Oral Tube size: 7.0 mm Number of attempts: 1 Airway Equipment and Method: Stylet Placement Confirmation: ETT inserted through vocal cords under direct vision,  positive ETCO2,  CO2 detector and breath sounds checked- equal and bilateral Secured at: 21 cm Tube secured with: Tape Dental Injury: Teeth and Oropharynx as per pre-operative assessment

## 2017-01-04 NOTE — Anesthesia Preprocedure Evaluation (Signed)
Anesthesia Evaluation  Patient identified by MRN, date of birth, ID band Patient awake    Reviewed: Allergy & Precautions, NPO status , Patient's Chart, lab work & pertinent test results  Airway Mallampati: II  TM Distance: >3 FB Neck ROM: Full    Dental no notable dental hx.    Pulmonary neg pulmonary ROS,    Pulmonary exam normal breath sounds clear to auscultation       Cardiovascular hypertension, Normal cardiovascular exam Rhythm:Regular Rate:Normal     Neuro/Psych negative neurological ROS  negative psych ROS   GI/Hepatic negative GI ROS, Neg liver ROS,   Endo/Other  diabetesHypothyroidism   Renal/GU negative Renal ROS  negative genitourinary   Musculoskeletal negative musculoskeletal ROS (+)   Abdominal   Peds negative pediatric ROS (+)  Hematology  (+) anemia ,   Anesthesia Other Findings   Reproductive/Obstetrics negative OB ROS                             Anesthesia Physical Anesthesia Plan  ASA: III  Anesthesia Plan: General   Post-op Pain Management:    Induction: Intravenous  PONV Risk Score and Plan: 2 and Ondansetron, Dexamethasone and Treatment may vary due to age or medical condition  Airway Management Planned: Oral ETT  Additional Equipment:   Intra-op Plan:   Post-operative Plan: Extubation in OR  Informed Consent: I have reviewed the patients History and Physical, chart, labs and discussed the procedure including the risks, benefits and alternatives for the proposed anesthesia with the patient or authorized representative who has indicated his/her understanding and acceptance.   Dental advisory given  Plan Discussed with: CRNA and Surgeon  Anesthesia Plan Comments:         Anesthesia Quick Evaluation

## 2017-01-04 NOTE — Interval H&P Note (Signed)
History and Physical Interval Note:  01/04/2017 11:37 AM  Sherry Hopkins  has presented today for surgery, with the diagnosis of ovarian cancer  The various methods of treatment have been discussed with the patient and family. After consideration of risks, benefits and other options for treatment, the patient has consented to  Procedure(s): EXPLORATORY LAPAROTOMY (N/A) BILATERAL SALPINGO OOPHORECTOMY (Bilateral) OMENTECTOMY WITH RADICAL TUMOR DEBULKING (N/A) POSSIBLE BOWEL RESECTION (N/A) as a surgical intervention .  The patient's history has been reviewed, patient examined, no change in status, stable for surgery.  I have reviewed the patient's chart and labs. She has marginal WBC and anemia. I believe it is safe to proceed with surgery with type and cross. I discussed the possibility of blood transfusion with the patient and her daughter.  Questions were answered to the patient's satisfaction.     Donaciano Eva

## 2017-01-04 NOTE — H&P (View-Only) (Signed)
Consult Note: Gyn-Onc  Consult was requested by Robynn Pane PA for the evaluation of Sherry Hopkins 76 y.o. female  CC:  Chief Complaint  Patient presents with  . Peritoneal carcinomatosis [facility])    Assessment/Plan:  [name]  is a 76 y.o.  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 She has severe protein malnutrition and debilitation from her disease however has had some improvements in both nutritional status and performance status s/p 3 cycles neoadjuvant chemotherapy (day 1 of cycle 3 was 12/15/16). On imaging she has had some modest response in the tumor, though she still has a bulky omental cake and on pelvic exam still has a bulky pelvic mass. Due to this, she is not a candidate for MIS surgery for interval debulking. Instead I offered her ex lap, BSO, omentectomy, radical tumor debulking, possible bowel resection. I discussed that there is still major risk associated with surgery, including delayed healing that results in slow return to chemotherapy which could adversely effect outcomes from her cancer. However, if we do not perform surgery at this juncture, I feel that we are determining a non-curable intent course to therapy. The patient and her daughter are still interested in curative intent therapy therefore will proceed with surgery on 01/04/17.  Risks discussed including  bleeding, infection, damage to internal organs (such as bladder,ureters, bowels), blood clot, reoperation and rehospitalization.  She has not had CA 125 drawn during her treatments and so we will assess this today.  HPI: Sherry Hopkins is a 76 year old P2 who is seen in consultation at the request of Robynn Pane PA for peritoneal carcinomatosis.  The patient began feeling abdominal distension in May, 2018. She was seen by an urgent care and a CT chest was normal. She was treated for reflux with medical management. Her symptoms did not resolve.  She was  taken to the [facility] ED on 10/07/16 with persistent symptoms of dyspepsia, abdominal distension, anorexia, weight loss, reflux, and occasional emesis.  A CT abd/pelvis was performed which showed evidence of extensive peritoneal carcinomatosis ncluding large volume of loculated ascites, omental caking, and calcified peritoneal deposits. The patient is status post hysterectomy. If the patient still has ovaries then this would be a likely origin. Alternatively GI malignancies may present with peritoneal carcinomatosis. At this time there is no evidence for high-grade bowel obstruction or obstructive uropathy.  She CA 125 drawn on 10/11/16 which was elevated at 4,702.  Of note, total albumin was decreased at 2.9 indicating severe malnutrition.  CT was performed of the chest on 10/19/16 which was unremarkable for evidence of metastatic disease.  She is an otherwise fairly healthy woman with a history of hypertension, diet controlled DM and hypothyroidism. She has had a prior abdominal hysterectomy (not oophorectomy) for abnormal bleeding in her 40's.  She was deemed to not be a good operative candidate for primary debulking due to her very poor nutritional and performance status and a plan was made for neoadjuvant chemotherapy with 3 cycles of carb/tax.  Interval Hx:  Day 1 of cycle 1 was 11/03/16 and day 1 of cycle 3 was 12/15/16. Dr Shaune Pollack admniistered chemotherapy at [facility]. CA 125 was not assessed during therapy.  During treatment she regained some strength and appetite, though she still remains fairly weak. She required IV fluids during the off weaks.   CT scan chest/abdo/pelvis on 12/24/16 showed small volume malignant ascites, decreased. For example, there is malignant ascites along the left hepatic  lobe and stomach along the hepatic renal fossa and in the right dependent pelvis adjacent to the rectum.Calcified omental caking beneath the midline anterior abdominal wall, measuring 3.4 x 13.2 cm  grossly unchanged.  Albumin on 12/15/16: 3.4 (lower limit normal 3.5).  Current Meds:  Outpatient Encounter Prescriptions as of 12/27/2016  Medication Sig  . aspirin EC 81 MG tablet Take 81 mg by mouth every morning.  . Cholecalciferol (VITAMIN D PO) Take 1 tablet by mouth daily.  . enalapril (VASOTEC) 5 MG tablet Take 5 mg by mouth every morning.  . furosemide (LASIX) 40 MG tablet Take 1 tablet (40 mg total) by mouth daily as needed. (Patient taking differently: Take 40 mg by mouth daily as needed for fluid or edema. )  . levothyroxine (SYNTHROID, LEVOTHROID) 25 MCG tablet Take 25 mcg by mouth daily before breakfast.  . lidocaine-prilocaine (EMLA) cream Apply a quarter size amount to affected area 1 hour prior to coming to chemotherapy.  Marland Kitchen LORazepam (ATIVAN) 0.5 MG tablet Take 1 tablet (0.5 mg total) by mouth every 8 (eight) hours. (Patient taking differently: Take 0.5 mg by mouth every 8 (eight) hours as needed for anxiety. )  . megestrol (MEGACE) 40 MG/ML suspension Take 2 mLs (80 mg total) by mouth 2 (two) times daily.  . metFORMIN (GLUCOPHAGE-XR) 500 MG 24 hr tablet Take 500 mg by mouth daily with supper.   . naproxen sodium (ANAPROX) 220 MG tablet Take 220 mg by mouth daily as needed (pain).   Marland Kitchen omeprazole (PRILOSEC) 20 MG capsule Take 1 capsule (20 mg total) by mouth daily.  . ondansetron (ZOFRAN ODT) 4 MG disintegrating tablet 4mg  ODT q4 hours prn nausea/vomit  . ondansetron (ZOFRAN-ODT) 8 MG disintegrating tablet TAKE 1 TABLET (8 MG TOTAL) BY MOUTH EVERY 8 (EIGHT) HOURS AS NEEDED FOR NAUSEA OR VOMITING.  . polyethylene glycol powder (GLYCOLAX/MIRALAX) powder Take 1 capful daily. (Patient taking differently: Take 17 g by mouth daily. Take 1 capful daily. 1 capful = 17g)  . Potassium Chloride ER 20 MEQ TBCR Take 20 mEq by mouth 2 (two) times daily.  . pravastatin (PRAVACHOL) 80 MG tablet Take 80 mg by mouth daily.   . prochlorperazine (COMPAZINE) 10 MG tablet Take 1 tablet (10 mg total)  by mouth every 6 (six) hours as needed for nausea or vomiting.  Orlie Dakin Sodium (SENOKOT S PO) Take 1 tablet by mouth at bedtime.  Marland Kitchen spironolactone (ALDACTONE) 25 MG tablet Take 25 mg by mouth 2 (two) times daily.  . traMADol (ULTRAM) 50 MG tablet Take 1 tablet (50 mg total) by mouth every 6 (six) hours as needed. (Patient taking differently: Take 50 mg by mouth every 6 (six) hours as needed (pain). )  . valsartan (DIOVAN) 80 MG tablet Take 80 mg by mouth daily.   No facility-administered encounter medications on file as of 12/27/2016.     Allergy: No Known Allergies  Social Hx:   Social History   Social History  . Marital status: Divorced    Spouse name: N/A  . Number of children: N/A  . Years of education: N/A   Occupational History  . Not on file.   Social History Main Topics  . Smoking status: Never Smoker  . Smokeless tobacco: Never Used  . Alcohol use No  . Drug use: No  . Sexual activity: Yes    Birth control/ protection: Surgical   Other Topics Concern  . Not on file   Social History Narrative  . No narrative  on file    Past Surgical Hx:  Past Surgical History:  Procedure Laterality Date  . ABDOMINAL HYSTERECTOMY    . CATARACT EXTRACTION W/PHACO  08/23/2011   Procedure: CATARACT EXTRACTION PHACO AND INTRAOCULAR LENS PLACEMENT (IOC);  Surgeon: Tonny Branch, MD;  Location: [facility];  Service: Ophthalmology;  Laterality: Left;  CDE:15.60  . CATARACT EXTRACTION W/PHACO Right 08/16/2013   Procedure: CATARACT EXTRACTION PHACO AND INTRAOCULAR LENS PLACEMENT (IOC);  Surgeon: Tonny Branch, MD;  Location: [facility];  Service: Ophthalmology;  Laterality: Right;  CDE:8.18  . PORTACATH PLACEMENT Left 11/01/2016   Procedure: INSERTION PORT-A-CATH LEFT SUBCLAVIAN;  Surgeon: Aviva Signs, MD;  Location: [facility];  Service: General;  Laterality: Left;    Past Medical Hx:  Past Medical History:  Diagnosis Date  . Diabetes mellitus type 2, diet-controlled (HCC)    diet  controlled  . Hypercholesteremia   . Hypertension   . Hypothyroidism   . Iron deficiency anemia 10/12/2016  . Peritoneal carcinomatosis ([place]) 10/11/2016    Past Gynecological History:  SVD x 2 No LMP recorded. Patient has had a hysterectomy.  Family Hx: History reviewed. No pertinent family history.  Review of Systems:  Constitutional  Feels fatigued and unwell,    ENT Normal appearing ears and nares bilaterally.  Skin/Breast  No rash, sores, jaundice, itching, dryness Cardiovascular  No chest pain, shortness of breath, or edema  Pulmonary  No cough or wheeze.  Gastro Intestinal  + anorexia, + decreased BM, no emesis  Genito Urinary  No frequency, urgency, dysuria,  Musculo Skeletal  No myalgia, arthralgia, joint swelling or pain  Neurologic  No weakness, numbness, change in gait,  Psychology  No depression, anxiety, insomnia.   Vitals:  Blood pressure 133/70, pulse 63, temperature 98.2 F (36.8 C), temperature source Oral, resp. rate 20, weight 119 lb 4.8 oz (54.1 kg), SpO2 100 %.  Physical Exam: WD Sunken cheeks Neck  Supple NROM, without any enlargements.  Lymph Node Survey No cervical supraclavicular or inguinal adenopathy Cardiovascular  Pulse normal rate, regularity and rhythm. S1 and S2 normal.  Lungs  Clear to auscultation bilateraly, without wheezes/crackles/rhonchi. Good air movement.  Skin  No rash/lesions/breakdown  Psychiatry  Alert and oriented to person, place, and time  Abdomen  Normoactive bowel sounds, abdomen soft, non-tender and thin, slightly distended with fluid without evidence of hernia. Firm mass in lower abdomen consistent with omental cake. Back No CVA tenderness Genito Urinary  Vulva/vagina: Normal external female genitalia.   No lesions. No discharge or bleeding.  Bladder/urethra:  No lesions or masses, well supported bladder  Vagina: normal  Adnexa: irregular nodular masses filling pelvis . Rectal  + cul de sac nodularity.   Extremities  No bilateral cyanosis, clubbing or edema.   Donaciano Eva, MD  12/27/2016, 11:42 AM

## 2017-01-04 NOTE — Anesthesia Postprocedure Evaluation (Signed)
Anesthesia Post Note  Patient: Sherry Hopkins  Procedure(s) Performed: EXPLORATORY LAPAROTOMY REPAIR OF CYSTOTOMY (N/A )     Patient location during evaluation: PACU Anesthesia Type: General Level of consciousness: awake and alert Pain management: pain level controlled Vital Signs Assessment: post-procedure vital signs reviewed and stable Respiratory status: spontaneous breathing, nonlabored ventilation, respiratory function stable and patient connected to nasal cannula oxygen Cardiovascular status: blood pressure returned to baseline and stable Postop Assessment: no apparent nausea or vomiting Anesthetic complications: no    Last Vitals:  Vitals:   01/04/17 1500 01/04/17 1515  BP: (!) 176/101 (!) 177/98  Pulse: 75 74  Resp: 11 10  Temp:  36.5 C  SpO2: 100% 100%    Last Pain:  Vitals:   01/04/17 1515  TempSrc:   PainSc: 10-Worst pain ever                 Makih Stefanko S

## 2017-01-04 NOTE — Telephone Encounter (Signed)
Scheduled post op appt, patient to receive appt with discharge summary

## 2017-01-04 NOTE — Transfer of Care (Signed)
Immediate Anesthesia Transfer of Care Note  Patient: Lisaann Atha  Procedure(s) Performed: EXPLORATORY LAPAROTOMY REPAIR OF CYSTOTOMY (N/A )  Patient Location: PACU  Anesthesia Type:General  Level of Consciousness: sedated, patient cooperative and responds to stimulation  Airway & Oxygen Therapy: Patient Spontanous Breathing and Patient connected to face mask oxygen  Post-op Assessment: Report given to RN and Post -op Vital signs reviewed and stable  Post vital signs: Reviewed and stable  Last Vitals:  Vitals:   01/04/17 1048  BP: 139/74  Pulse: 82  Resp: 18  Temp: 36.7 C  SpO2: 100%    Last Pain:  Vitals:   01/04/17 1048  TempSrc: Oral      Patients Stated Pain Goal: 3 (32/20/25 4270)  Complications: No apparent anesthesia complications

## 2017-01-04 NOTE — Op Note (Signed)
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  Assistant Surgeon: Lahoma Crocker, M.D. Assistant: (an MD assistant was necessary for tissue manipulation, retraction and positioning due to the complexity of the case and hospital policies).   Specimens: none   Estimated Blood Loss: 100 mL.    Urine Output:100cc (with unmeasurable losses during cystotomy)  Complications: None.   Operative Findings: 20cm tumor plaque from omentum infiltrating across entire mid abdominal wall and investing the dome of the bladder. No apparent transverse colon obstruction. Tumor was not resectable without resecting significant portion of bladder and anterior abdominal wall. Given modest response to first 3 cycles of chemotherapy, and persistent poor nutritional status, a radical debulking effort was not felt to be proportionate or curative.    This represented a suboptimal debulking.  Procedure:   The patient was seen in the Holding Room. The risks, benefits, complications, treatment options, and expected outcomes were discussed with the patient.  The patient concurred with the proposed plan, giving informed consent.   The patient was  identified as Sherry Hopkins  and the procedure verified as BSO, omentectomy, tumor debulking. A Time Out was held and the above information confirmed upon entry to the operating room..  After induction of anesthesia, the patient was draped and prepped in the usual sterile manner.  She was prepped and draped in the normal sterile fashion in the dorsal lithotomy position in padded Allen stirrups with good attention paid to support of the lower back and lower extremities. Position was adjusted for appropriate support. A Foley catheter was placed to gravity.   A  midline vertical incision was made and carried through the subcutaneous tissue to the fascia. The fascial incision was made and extended superiorally. Upon attempted passage through the fascia in the mid third of the abdomen, it was apparent that the tumor was infiltrating through the fascia. A tissue plane could not be created to separate the tumor from the anterior abdominal wall/posterior rectus sheath. An attempt was made to enter the peritoneal cavity at the inferior portion of the incision. In doing so, an unavoidable cystotomy was created because the dome of the bladder had become involved, adherent to and infiltrated by the mid abdominal tumor mass (which was the omental cake). In order to approach the tumor from an inferior approach, approximately one third of the bladder would need to be resected. The fascia was opened in the midline in the upper abdomen above the level of the mass. The peritoneum was entered and approximately 500cc of amber ascites was aspirated. This facilitated palpation of the upper abdomen. There were no dilated loops of bowel or apparent GI obstruction. There was no palpable tumor in the upper abdomen, though the omentum and stomach and transverse colon had been dragged into the mid abdomen by the omental mass which was adherent and infiltrating the mid abdominal wall (20x10cm).   A decision was made to not proceed with debulking, as, in order to remove the dominant tumor mass, the patient would require partial cystectomy, transverse colectomy, and massive anterior abdominal wall reconstruction. Given her poor nutritional status (Albumin 3.7) and poor performance status, in addition to the modest improvements that she has demonstrated with the first 3 cycles of chemotherapy (relatively chemoresistant tumor), a decision was made to complete the surgical procedure.   The cystotomy  was closely appraised. It measured 10cm cephalo-caudad in the midline of the anterior dome of the  bladder. The ureteral orifices could be clearly visualized at the base of the bladder. The mucosa was identied at the superior and inferior margins. A 3-0 vicryl suture (full thickness through mucosa) was run the length of the cystotomy to close the mucosa. A 2-0 vicryl was then used to imbricate the detrusor muscle over the mucosal closure in a 2-layer closure. The closure was noted to be water tight.  The fascial edges were attempted to be separated from the tumor mass such that a fascial closure could be accomplished. The fascia was reapproximated with 0 looped PDS using a total of two sutures. The subcutaneous layer was then irrigated copiously.  Exparel long acting local anesthetic was infiltrated into the subcutaneous tissues. The skin was closed with subcuticular suture. The patient tolerated the procedure well.   Sponge, lap and needle counts were correct x 2.  The foley remained in the bladder (and will so for 2 weeks).

## 2017-01-05 LAB — CBC
HCT: 28.6 % — ABNORMAL LOW (ref 36.0–46.0)
Hemoglobin: 9.3 g/dL — ABNORMAL LOW (ref 12.0–15.0)
MCH: 27.8 pg (ref 26.0–34.0)
MCHC: 32.5 g/dL (ref 30.0–36.0)
MCV: 85.4 fL (ref 78.0–100.0)
PLATELETS: 339 10*3/uL (ref 150–400)
RBC: 3.35 MIL/uL — ABNORMAL LOW (ref 3.87–5.11)
RDW: 21.8 % — AB (ref 11.5–15.5)
WBC: 11 10*3/uL — ABNORMAL HIGH (ref 4.0–10.5)

## 2017-01-05 LAB — BASIC METABOLIC PANEL
Anion gap: 9 (ref 5–15)
BUN: 13 mg/dL (ref 6–20)
CALCIUM: 8.5 mg/dL — AB (ref 8.9–10.3)
CO2: 26 mmol/L (ref 22–32)
Chloride: 97 mmol/L — ABNORMAL LOW (ref 101–111)
Creatinine, Ser: 0.66 mg/dL (ref 0.44–1.00)
GFR calc Af Amer: >60 mL/min (ref >60)
GLUCOSE: 147 mg/dL — AB (ref 65–99)
Potassium: 4.3 mmol/L (ref 3.5–5.1)
Sodium: 132 mmol/L — ABNORMAL LOW (ref 135–145)

## 2017-01-05 LAB — GLUCOSE, CAPILLARY
GLUCOSE-CAPILLARY: 122 mg/dL — AB (ref 65–99)
Glucose-Capillary: 117 mg/dL — ABNORMAL HIGH (ref 65–99)
Glucose-Capillary: 137 mg/dL — ABNORMAL HIGH (ref 65–99)
Glucose-Capillary: 131 mg/dL — ABNORMAL HIGH (ref 65–99)

## 2017-01-05 MED ORDER — ENOXAPARIN (LOVENOX) PATIENT EDUCATION KIT
PACK | Freq: Once | Status: AC
  Administered 2017-01-05: 13:00:00
  Filled 2017-01-05: qty 1

## 2017-01-05 MED ORDER — ENOXAPARIN SODIUM 40 MG/0.4ML ~~LOC~~ SOLN
40.0000 mg | SUBCUTANEOUS | Status: DC
  Administered 2017-01-05 – 2017-01-07 (×3): 40 mg via SUBCUTANEOUS
  Filled 2017-01-05 (×4): qty 0.4

## 2017-01-05 NOTE — Progress Notes (Signed)
1 Day Post-Op Procedure(s) (LRB): EXPLORATORY LAPAROTOMY REPAIR OF CYSTOTOMY (N/A)  Subjective: Patient reports ambulating in the halls this am without difficulty.  Tolerating sips of liquids without nausea or emesis.  Pain controlled with ordered measures.  Daughter at bedside.  Denies passing flatus or having a bowel movement.  No concerns voiced.    Objective: Vital signs in last 24 hours: Temp:  [97.6 F (36.4 C)-99.6 F (37.6 C)] 99.6 F (37.6 C) (10/24 0848) Pulse Rate:  [73-99] 99 (10/24 0848) Resp:  [7-26] 16 (10/24 0848) BP: (140-180)/(83-105) 147/83 (10/24 0848) SpO2:  [97 %-100 %] 97 % (10/24 0848) Last BM Date: 12/30/16  Intake/Output from previous day: 10/23 0701 - 10/24 0700 In: 2587.5 [I.V.:2337.5; IV Piggyback:250] Out: 1250 [Urine:1150; Blood:100]  Physical Examination: General: alert, cooperative and no distress Resp: clear to auscultation bilaterally Cardio: regular rate and rhythm, S1, S2 normal, no murmur, click, rub or gallop GI: incision: stained dressing removed, no active bleeding noted, incision cleansed by Dr. Othelia Pulling, incision with dermabond, RN to replace honeycomb dressing and abdomen with active bowel sounds, slightly tender on palpation, soft in upper quadrants, mild firmness related to cancer in the lower abdomen Extremities: extremities normal, atraumatic, no cyanosis or edema Foley to straight drain with blood tinged urine  Labs: WBC/Hgb/Hct/Plts:  11.0/9.3/28.6/339 (10/24 5784) BUN/Cr/glu/ALT/AST/amyl/lip:  13/0.66/--/--/--/--/-- (10/24 6962)  Assessment: 76 y.o. s/p Procedure(s): EXPLORATORY LAPAROTOMY REPAIR OF CYSTOTOMY: stable Pain:  Pain is well-controlled on PRN medications.  Heme: Hgb 9.3 and Hct 28.6 this am. Stable post-operatively.  CV: BP and HR stable post-op.  Continue to monitor with ordered vital sign assessments.  GI:  Tolerating po: Yes.  Minimal amount of liquids.  Anti-emetics ordered PRN.   GU: Foley in place.  To  remain in place for the next 2-3 weeks with plans for a retrograde cystogram prior to removal.  FEN: Stable post-op.  Prophylaxis: pharmacologic prophylaxis (with any of the following: enoxaparin (Lovenox) 40mg  SQ 2 hours prior to surgery then every day) and intermittent pneumatic compression boots.  Plan: Continue plan of care per Dr. Delsa Sale If diet tolerated and adequate PO intake, can saline lock Kpad for pain relief Encourage ambulation, IS use, deep breathing, and coughing   LOS: 1 day    Osualdo Hansell DEAL 01/05/2017, 11:43 AM

## 2017-01-06 LAB — GLUCOSE, CAPILLARY
GLUCOSE-CAPILLARY: 113 mg/dL — AB (ref 65–99)
GLUCOSE-CAPILLARY: 114 mg/dL — AB (ref 65–99)
Glucose-Capillary: 108 mg/dL — ABNORMAL HIGH (ref 65–99)
Glucose-Capillary: 97 mg/dL (ref 65–99)

## 2017-01-06 NOTE — Progress Notes (Signed)
2 Days Post-Op Procedure(s) (LRB): EXPLORATORY LAPAROTOMY REPAIR OF CYSTOTOMY (N/A)  Subjective: Patient reports feeling better this am with an appetite.  She reported intermittent episodes of nausea yesterday that have resolved today.  She states she ate a small amount this am.  Pain controlled with ordered measures.  Denies passing flatus or having a bowel movement.  No concerns voiced.    Objective: Vital signs in last 24 hours: Temp:  [98.7 F (37.1 C)-98.9 F (37.2 C)] 98.7 F (37.1 C) (10/25 0557) Pulse Rate:  [84-88] 88 (10/25 0909) Resp:  [16-18] 18 (10/25 0557) BP: (107-133)/(50-83) 121/61 (10/25 0909) SpO2:  [98 %-100 %] 98 % (10/25 0557) Last BM Date: 12/30/16  Intake/Output from previous day: 10/24 0701 - 10/25 0700 In: 1200 [I.V.:1200] Out: 1575 [Urine:1275; Emesis/NG output:300]  Physical Examination: General: alert, cooperative and no distress Resp: clear to auscultation bilaterally Cardio: regular rate and rhythm, S1, S2 normal, no murmur, click, rub or gallop GI: incision: honeycomb dressing intact with no evidence of drainage or erythema and abdomen with hypoactive bowel sounds, slightly tender on palpation, soft in upper quadrants, mild firmness related to cancer in the lower abdomen Extremities: extremities normal, atraumatic, no cyanosis or edema Foley to straight drain with light orange urine, hematuria improving.  Assessment: 76 y.o. s/p Procedure(s): EXPLORATORY LAPAROTOMY REPAIR OF CYSTOTOMY: stable Pain:  Pain is well-controlled on PRN medications.  Heme: Hgb 9.3 and Hct 28.6 01/05/17 am. Stable post-operatively.  CV: BP and HR stable post-op.  Continue to monitor with ordered vital sign assessments.  GI:  Tolerating po: Yes.  Appetite increasing.  Anti-emetics ordered PRN.   GU: Foley in place.  To remain in place for the next 2-3 weeks with plans for a retrograde cystogram prior to removal.  FEN: Stable post-op.  Prophylaxis: pharmacologic  prophylaxis (with any of the following: enoxaparin (Lovenox) 40mg  SQ 2 hours prior to surgery then every day) and intermittent pneumatic compression boots.  Plan: Continue plan of care per Dr. Delsa Sale If diet tolerated and adequate PO intake, can saline lock Kpad for pain relief Encourage ambulation, IS use, deep breathing, and coughing   LOS: 2 days    CROSS, MELISSA DEAL 01/06/2017, 12:23 PM

## 2017-01-06 NOTE — Progress Notes (Signed)
Discharge planning, spoke with patient at beside and daughter on the phone. Requesting nurse to assessment patient at d/c for medication changes and incision. Chose [facility] for [facility] services, contacted [facility] for referral. [phone]

## 2017-01-07 LAB — CBC
HCT: 24.5 % — ABNORMAL LOW (ref 36.0–46.0)
Hemoglobin: 7.9 g/dL — ABNORMAL LOW (ref 12.0–15.0)
MCH: 27.6 pg (ref 26.0–34.0)
MCHC: 32.2 g/dL (ref 30.0–36.0)
MCV: 85.7 fL (ref 78.0–100.0)
Platelets: 227 10*3/uL (ref 150–400)
RBC: 2.86 MIL/uL — ABNORMAL LOW (ref 3.87–5.11)
RDW: 21.7 % — ABNORMAL HIGH (ref 11.5–15.5)
WBC: 9.6 10*3/uL (ref 4.0–10.5)

## 2017-01-07 LAB — GLUCOSE, CAPILLARY
GLUCOSE-CAPILLARY: 92 mg/dL (ref 65–99)
Glucose-Capillary: 95 mg/dL (ref 65–99)
Glucose-Capillary: 105 mg/dL — ABNORMAL HIGH (ref 65–99)
Glucose-Capillary: 85 mg/dL (ref 65–99)

## 2017-01-07 LAB — PREPARE RBC (CROSSMATCH)

## 2017-01-07 MED ORDER — SODIUM CHLORIDE 0.9 % IV SOLN
Freq: Once | INTRAVENOUS | Status: AC
  Administered 2017-01-07: 16:00:00 via INTRAVENOUS

## 2017-01-07 MED ORDER — TRAMADOL HCL 50 MG PO TABS: 50.0000 mg | ORAL_TABLET | Freq: Four times a day (QID) | ORAL | 0 refills | Status: DC | PRN

## 2017-01-07 MED ORDER — ENOXAPARIN SODIUM 40 MG/0.4ML ~~LOC~~ SOLN
40.0000 mg | SUBCUTANEOUS | 0 refills | Status: DC
Start: 2017-01-07 — End: 2017-02-15

## 2017-01-07 NOTE — Care Management Important Message (Signed)
Important Message  Patient Details  Name: Malyssa Maris MRN: [id number] Date of Birth: December 05, 1940   Medicare Important Message Given:  Yes    Kerin Salen 01/07/2017, 10:49 AMImportant Message  Patient Details  Name: Evia Goldsmith MRN: [id number] Date of Birth: [birth year]   Medicare Important Message Given:  Yes    Kerin Salen 01/07/2017, 10:49 AM

## 2017-01-07 NOTE — Progress Notes (Signed)
3 Days Post-Op Procedure(s) (LRB): EXPLORATORY LAPAROTOMY REPAIR OF CYSTOTOMY (N/A)  Subjective: Patient reports mild burning at her incision this am.  Eating solid food this am with no nausea or emesis.  Passing flatus.  Ambulating and to the chair with assist.  Pain controlled with PRN measures.  When asked about discharge today per Dr. Delsa Sale, pt states "well I guess I am fine with that."  No bowel movement reported.  No concerns voiced.  No family at the bedside.   Objective: Vital signs in last 24 hours: Temp:  [98.7 F (37.1 C)-99.1 F (37.3 C)] 99.1 F (37.3 C) (10/26 0541) Pulse Rate:  [82-88] 88 (10/26 0541) Resp:  [18] 18 (10/26 0541) BP: (107-120)/(54-65) 120/62 (10/26 0541) SpO2:  [97 %-100 %] 99 % (10/26 0541) Last BM Date: 12/30/16  Intake/Output from previous day: 10/25 0701 - 10/26 0700 In: 1900 [P.O.:900; I.V.:1000] Out: 3050 [Urine:3050]  Physical Examination: General: alert, cooperative and no distress Resp: clear to auscultation bilaterally Cardio: regular rate and rhythm, S1, S2 normal, no murmur, click, rub or gallop GI: soft, non-tender; bowel sounds normal; no masses,  no organomegaly Extremities: extremities normal, atraumatic, no cyanosis or edema  Incision: Midline incision with honeycomb dressing in place with no erythema or drainage present. Foley in place with yellow urine draining  Labs: WBC/Hgb/Hct/Plts:  9.6/7.9/24.5/227 (10/26 0973)    Assessment: 76 y.o. s/p Procedure(s): EXPLORATORY LAPAROTOMY REPAIR OF CYSTOTOMY: stable Pain:  Pain is well-controlled on PRN medications.  Heme: Plan to recheck CBC this am to evaluate WBC count due to low grade temperatures, last 99.1.  CV: BP and HR stable post-op.  Continue to monitor post-op with ordered vital sign assessments.  GI:  Tolerating po: Yes.  Antiemetics ordered as needed.  Megace ordered as well to stimulate appetite.  GU: Foley in place and pt to go home with at discharge.       FEN: Stable post-operatively.  Prophylaxis: pharmacologic prophylaxis (with any of the following: enoxaparin (Lovenox) 40mg  SQ 2 hours prior to surgery then every day) and intermittent pneumatic compression boots.  Plan: Plan for repeat CBC this am Possible discharge later today pending results of CBC Encourage ambulation, IS use, deep breathing, and coughing Continue post-op plan of care per Dr. Delsa Sale   LOS: 3 days    Stacy Sailer DEAL 01/07/2017, 11:37 AM

## 2017-01-07 NOTE — Progress Notes (Signed)
Based on CBC results and after discussion with patient (per RN) and with her daughter, plan to give one unit of PRBCs today since patient will be having additional chemotherapy in the next several weeks.  Discharge on hold at this time.  Daughter concerned because her mother has not had a BM and has not eaten a full meal.  Advised her that she is passing flatus and was eating some this am.  All questions answered.  Will continue to follow.

## 2017-01-07 NOTE — Discharge Instructions (Signed)
01/07/2017  Return to work: 4-6 weeks if applicable  Activity: 1. Be up and out of the bed during the day.  Take a nap if needed.  You may walk up steps but be careful and use the hand rail.  Stair climbing will tire you more than you think, you may need to stop part way and rest.   2. No lifting or straining for 6 weeks.  3. No driving for 2 week(s) if you were cleared to drive previously.  Do not drive if you are taking narcotic pain medicine.  4. Shower daily.  Use soap and water on your incision and pat dry; don't rub.  No tub baths until cleared by your surgeon.   5. No sexual activity and nothing in the vagina for 4-6 weeks.  6. You may experience a small amount of clear drainage from your incisions, which is normal.  If the drainage persists or increases, please call the office.  7. You may experience vaginal spotting after surgery or around the 6-8 week mark from surgery when the stitches at the top of the vagina begin to dissolve.  The spotting is normal but if you experience heavy bleeding, call our office.  8. Take Tylenol or ibuprofen first for pain and only use Percocet for severe pain not relieved by the Tylenol or Ibuprofen.  Monitor your Tylenol intake to a max of 4,000 mg a day since Percocet has Tylenol in it as well.  9. Remove the dressing from your incision five days after surgery.  No dressing will be needed.  There is dermabond glue on the incision that dissolves on its own.  Diet: 1. Low sodium Heart Healthy Diet is recommended.  2. It is safe to use a laxative, such as Miralax or Colace, if you have difficulty moving your bowels. You can take Sennakot at bedtime every evening to keep bowel movements regular and to prevent constipation.    Wound Care: 1. Keep clean and dry.  Shower daily.  Reasons to call the Doctor:  Fever - Oral temperature greater than 100.4 degrees Fahrenheit  Foul-smelling vaginal discharge  Difficulty urinating  Nausea and  vomiting  Increased pain at the site of the incision that is unrelieved with pain medicine.  Difficulty breathing with or without chest pain  New calf pain especially if only on one side  Sudden, continuing increased vaginal bleeding with or without clots.   Contacts: For questions or concerns you should contact:  Dr. Everitt Amber at [phone]  Joylene John, NP at [phone]  After Hours: call [phone] and have the GYN Oncologist paged/contacted   Indwelling Urinary Catheter Care, Adult Take good care of your catheter to keep it working and to prevent problems. How to wear your catheter Attach your catheter to your leg with tape (adhesive tape) or a leg strap. Make sure it is not too tight. If you use tape, remove any bits of tape that are already on the catheter. How to wear a drainage bag You should have:  A large overnight bag.  A small leg bag.  Overnight Bag You may wear the overnight bag at any time. Always keep the bag below the level of your bladder but off the floor. When you sleep, put a clean plastic bag in a wastebasket. Then hang the bag inside the wastebasket. Leg Bag Never wear the leg bag at night. Always wear the leg bag below your knee. Keep the leg bag secure with a leg strap or tape.  How to care for your skin  Clean the skin around the catheter at least once every day.  Shower every day. Do not take baths.  Put creams, lotions, or ointments on your genital area only as told by your doctor.  Do not use powders, sprays, or lotions on your genital area. How to clean your catheter and your skin 1. Wash your hands with soap and water. 2. Wet a washcloth in warm water and gentle (mild) soap. 3. Use the washcloth to clean the skin where the catheter enters your body. Clean downward and wipe away from the catheter in small circles. Do not wipe toward the catheter. 4. Pat the area dry with a clean towel. Make sure to clean off all soap. How to care  for your drainage bags Empty your drainage bag when it is ?- full or at least 2-3 times a day. Replace your drainage bag once a month or sooner if it starts to smell bad or look dirty. Do not clean your drainage bag unless told by your doctor. Emptying a drainage bag  Supplies Needed  Rubbing alcohol.  Gauze pad or cotton ball.  Tape or a leg strap.  Steps 1. Wash your hands with soap and water. 2. Separate (detach) the bag from your leg. 3. Hold the bag over the toilet or a clean container. Keep the bag below your hips and bladder. This stops pee (urine) from going back into the tube. 4. Open the pour spout at the bottom of the bag. 5. Empty the pee into the toilet or container. Do not let the pour spout touch any surface. 6. Put rubbing alcohol on a gauze pad or cotton ball. 7. Use the gauze pad or cotton ball to clean the pour spout. 8. Close the pour spout. 9. Attach the bag to your leg with tape or a leg strap. 10. Wash your hands.  Changing a drainage bag Supplies Needed  Alcohol wipes.  A clean drainage bag.  Adhesive tape or a leg strap.  Steps 1. Wash your hands with soap and water. 2. Separate the dirty bag from your leg. 3. Pinch the rubber catheter with your fingers so that pee does not spill out. 4. Separate the catheter tube from the drainage tube where these tubes connect (at the connection valve). Do not let the tubes touch any surface. 5. Clean the end of the catheter tube with an alcohol wipe. Use a different alcohol wipe to clean the end of the drainage tube. 6. Connect the catheter tube to the drainage tube of the clean bag. 7. Attach the new bag to the leg with adhesive tape or a leg strap. 8. Wash your hands.  How to prevent infection and other problems  Never pull on your catheter or try to remove it. Pulling can damage tissue in your body.  Always wash your hands before and after touching your catheter.  If a leg strap gets wet, replace it  with a dry one.  Drink enough fluids to keep your pee clear or pale yellow, or as told by your doctor.  Do not let the drainage bag or tubing touch the floor.  Wear cotton underwear.  If you are female, wipe from front to back after you poop (have a bowel movement).  Check on the catheter often to make sure it works and the tubing is not twisted. Get help if:  Your pee is cloudy.  Your pee smells unusually bad.  Your pee is not  draining into the bag.  Your tube gets clogged.  Your catheter starts to leak.  Your bladder feels full. Get help right away if:  You have redness, swelling, or pain where the catheter enters your body.  You have fluid, pus, or a bad smell coming from the area where the catheter enters your body.  The area where the catheter enters your body feels warm.  You have a fever.  You have pain in your: ? Stomach (abdomen). ? Legs. ? Lower back. ? Bladder.  You see blood fill the catheter.  Your pee is pink or red.  You feel sick to your stomach (nauseous).  You throw up (vomit).  You have chills.  Your catheter gets pulled out. This information is not intended to replace advice given to you by your health care provider. Make sure you discuss any questions you have with your health care provider. Document Released: 06/26/2012 Document Revised: 01/28/2016 Document Reviewed: 08/14/2013 Elsevier Interactive Patient Education  2018 [employer].   How and Where to Give Subcutaneous Enoxaparin Injections Enoxaparin is an injectable medicine. It is used to help prevent blood clots from developing in your veins. Health care providers often use anticoagulants like enoxaparin to prevent clots following surgery. Enoxaparin is also used in combination with other medicines to treat blood clots and heart attacks. If blood clots are left untreated, they can be life threatening. Enoxaparin comes in single-use syringes. You inject enoxaparin through a  syringe into your belly (abdomen). You should change the injection site each time you give yourself a shot. Continue the enoxaparin injections as directed by your health care provider. Your health care provider will use blood clotting test results to decide when you can safely stop using enoxaparin injections. If your health care provider prescribes any additional medicines, use the medicines exactly as directed. How do I inject enoxaparin? 1. Wash your hands with soap and water. 2. Clean the selected injection site as directed by your health care provider. 3. Remove the needle cap by pulling it straight off the syringe. 4. When using a prefilled syringe, do not push the air bubble out of the syringe before the injection. The air bubble will help you get all of the medicine out of the syringe. 5. Hold the syringe like a pencil using your writing hand. 6. Use your other hand to pinch and hold an inch of the cleansed skin. 7. Insert the entire needle straight down into the fold of skin. 8. Push the plunger with your thumb until the syringe is empty. 9. Pull the needle straight out of your skin. 10. Enoxaparin injection prefilled syringes and graduated prefilled syringes are available with a system that shields the needle after injection. After you have completed your injection and removed the needle from your skin, firmly push down on the plunger. The protective sleeve will automatically cover the needle and you will hear a click. The click means the needle is safely covered. 11. Place the syringe in the nearest needle box, also called a sharps container. If you do not have a sharps container, you can use a hard-sided plastic container with a secure lid, such as an empty laundry detergent bottle. What else do I need to know?  Do not use enoxaparin if: ? You have allergies to heparin or pork products. ? You have been diagnosed with a condition called thrombocytopenia.  Do not use the syringe or  needle more than one time.  Use medicines only as directed by your  health care provider.  Changes in medicines, supplements, diet, and illness can affect your anticoagulation therapy. Be sure to inform your health care provider of any of these changes.  It is important that you tell all of your health care providers and your dentist that you are taking an anticoagulant, especially if you are injured or plan to have any type of procedure.  While on anticoagulants, you will need to have blood tests done routinely as directed by your health care provider.  While using this medicine, avoid physical activities or sports that could result in a fall or cause injury.  Follow up with your laboratory test and health care provider appointments as directed. It is very important to keep your appointments. Not keeping appointments could result in a chronic or permanent injury, pain, or disability.  Before giving your medicine, you should make sure the injection is a clear and colorless or pale yellow solution. If your medicine becomes discolored or if there are particles in the syringe, do not use it and notify your health care provider.  Keep your medicine safely stored at room temperature. Contact a health care provider if:  You develop any rashes on your skin.  You have large areas of bruising on your skin.  You have any worsening of the condition for which you take Enoxaparin.  You develop a fever. Get help right away if:  You develop bleeding problems such as: ? Bleeding from the gums or nose that does not stop quickly. ? Vomiting blood or coughing up blood. ? Blood in your urine. ? Blood in your stool, or stool that has a dark, tarry, or coffee grounds appearance. ? A cut that does not stop bleeding within 10 minutes. These symptoms may represent a serious problem that is an emergency. Do not wait to see if the symptoms will go away. Get medical help right away. Call your local emergency  services (911 in the [place] Do not drive yourself to the hospital. This information is not intended to replace advice given to you by your health care provider. Make sure you discuss any questions you have with your health care provider. Document Released: 01/01/2004 Document Revised: 11/06/2015 Document Reviewed: 08/16/2013 Elsevier Interactive Patient Education  2017 [employer].  Tramadol tablets What is this medicine? TRAMADOL (TRA ma dole) is a pain reliever. It is used to treat moderate to severe pain in adults. This medicine may be used for other purposes; ask your health care provider or pharmacist if you have questions. COMMON BRAND NAME(S): Ultram What should I tell my health care provider before I take this medicine? They need to know if you have any of these conditions: -brain tumor -depression -drug abuse or addiction -head injury -if you frequently drink alcohol containing drinks -kidney disease or trouble passing urine -liver disease -lung disease, asthma, or breathing problems -seizures or epilepsy -suicidal thoughts, plans, or attempt; a previous suicide attempt by you or a family member -an unusual or allergic reaction to tramadol, codeine, other medicines, foods, dyes, or preservatives -pregnant or trying to get pregnant -breast-feeding How should I use this medicine? Take this medicine by mouth with a full glass of water. Follow the directions on the prescription label. You can take it with or without food. If it upsets your stomach, take it with food. Do not take your medicine more often than directed. A special MedGuide will be given to you by the pharmacist with each prescription and refill. Be sure to read this information  carefully each time. Talk to your pediatrician regarding the use of this medicine in children. Special care may be needed. Overdosage: If you think you have taken too much of this medicine contact a poison control center or emergency room at  once. NOTE: This medicine is only for you. Do not share this medicine with others. What if I miss a dose? If you miss a dose, take it as soon as you can. If it is almost time for your next dose, take only that dose. Do not take double or extra doses. What may interact with this medicine? Do not take this medication with any of the following medicines: -MAOIs like Carbex, Eldepryl, Marplan, Nardil, and Parnate This medicine may also interact with the following medications: -alcohol -antihistamines for allergy, cough and cold -certain medicines for anxiety or sleep -certain medicines for depression like amitriptyline, fluoxetine, sertraline -certain medicines for migraine headache like almotriptan, eletriptan, frovatriptan, naratriptan, rizatriptan, sumatriptan, zolmitriptan -certain medicines for seizures like carbamazepine, oxcarbazepine, phenobarbital, primidone -certain medicines that treat or prevent blood clots like warfarin -digoxin -furazolidone -general anesthetics like halothane, isoflurane, methoxyflurane, propofol -linezolid -local anesthetics like lidocaine, pramoxine, tetracaine -medicines that relax muscles for surgery -other narcotic medicines for pain or cough -phenothiazines like chlorpromazine, mesoridazine, prochlorperazine, thioridazine -procarbazine This list may not describe all possible interactions. Give your health care provider a list of all the medicines, herbs, non-prescription drugs, or dietary supplements you use. Also tell them if you smoke, drink alcohol, or use illegal drugs. Some items may interact with your medicine. What should I watch for while using this medicine? Tell your doctor or health care professional if your pain does not go away, if it gets worse, or if you have new or a different type of pain. You may develop tolerance to the medicine. Tolerance means that you will need a higher dose of the medicine for pain relief. Tolerance is normal and is  expected if you take this medicine for a long time. Do not suddenly stop taking your medicine because you may develop a severe reaction. Your body becomes used to the medicine. This does NOT mean you are addicted. Addiction is a behavior related to getting and using a drug for a non-medical reason. If you have pain, you have a medical reason to take pain medicine. Your doctor will tell you how much medicine to take. If your doctor wants you to stop the medicine, the dose will be slowly lowered over time to avoid any side effects. There are different types of narcotic medicines (opiates). If you take more than one type at the same time or if you are taking another medicine that also causes drowsiness, you may have more side effects. Give your health care provider a list of all medicines you use. Your doctor will tell you how much medicine to take. Do not take more medicine than directed. Call emergency for help if you have problems breathing or unusual sleepiness. You may get drowsy or dizzy. Do not drive, use machinery, or do anything that needs mental alertness until you know how this medicine affects you. Do not stand or sit up quickly, especially if you are an older patient. This reduces the risk of dizzy or fainting spells. Alcohol can increase or decrease the effects of this medicine. Avoid alcoholic drinks. You may have constipation. Try to have a bowel movement at least every 2 to 3 days. If you do not have a bowel movement for 3 days, call your doctor or health  care professional. Your mouth may get dry. Chewing sugarless gum or sucking hard candy, and drinking plenty of water may help. Contact your doctor if the problem does not go away or is severe. What side effects may I notice from receiving this medicine? Side effects that you should report to your doctor or health care professional as soon as possible: -allergic reactions like skin rash, itching or hives, swelling of the face, lips, or  tongue -breathing problems -confusion -seizures -signs and symptoms of low blood pressure like dizziness; feeling faint or lightheaded, falls; unusually weak or tired -trouble passing urine or change in the amount of urine Side effects that usually do not require medical attention (report to your doctor or health care professional if they continue or are bothersome): -constipation -dry mouth -nausea, vomiting -tiredness This list may not describe all possible side effects. Call your doctor for medical advice about side effects. You may report side effects to FDA at 1-800-FDA-1088. Where should I keep my medicine? Keep out of the reach of children. This medicine may cause accidental overdose and death if it taken by other adults, children, or pets. Mix any unused medicine with a substance like cat litter or coffee grounds. Then throw the medicine away in a sealed container like a sealed bag or a coffee can with a lid. Do not use the medicine after the expiration date. Store at room temperature between 15 and 30 degrees C (59 and 86 degrees F). NOTE: This sheet is a summary. It may not cover all possible information. If you have questions about this medicine, talk to your doctor, pharmacist, or health care provider.  2018 Elsevier/Gold Standard (2014-11-24 09:00:04)

## 2017-01-07 NOTE — Progress Notes (Signed)
Recently spoke with daughter on phone regarding pt's dc home. Daughter concerned pt not ready and requested to speak to Lenna Sciara or MD. Joylene John, NP notified of request. Pt doesn't remember NP and doctor visit earlier this am. Pt reassured.

## 2017-01-08 LAB — TYPE AND SCREEN
ABO/RH(D): O POS
Antibody Screen: NEGATIVE
UNIT DIVISION: 0
UNIT DIVISION: 0

## 2017-01-08 LAB — BPAM RBC
BLOOD PRODUCT EXPIRATION DATE: 201811212359
Blood Product Expiration Date: 201811212359
ISSUE DATE / TIME: 201810261555
UNIT TYPE AND RH: 5100
UNIT TYPE AND RH: 5100

## 2017-01-08 LAB — GLUCOSE, CAPILLARY: GLUCOSE-CAPILLARY: 78 mg/dL (ref 65–99)

## 2017-01-08 NOTE — Progress Notes (Signed)
Patient discharged home with daughter, discharge instructions including lovenox teaching and foley care taught to patients daughter.  Patient and daughter verbalized understanding.    Denzal Meir RN

## 2017-01-08 NOTE — Discharge Summary (Signed)
Physician Discharge Summary  Patient ID: Sherry Hopkins MRN: [id number] DOB/AGE: Jan 29, 1941 76 y.o.  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   The postoperative course was uneventful.  The hemoglobin trended down to 7.9.  She was transfused 1 unit PRBC.  She was discharged to home on postoperative day 4 tolerating a regular diet.  Consults: None  Significant Diagnostic Studies: None  Treatments: surgery: see above  Discharge Exam: Blood pressure 122/66, pulse 81, temperature 98.5 F (36.9 C), temperature source Oral, resp. rate 18, height 5\' 3"  (1.6 m), weight 121 lb (54.9 kg), SpO2 97 %. General appearance: alert Resp: clear to auscultation bilaterally Cardio: regular rate and rhythm, S1, S2 normal, no murmur, click, rub or gallop GI: soft, non-tender; bowel sounds normal; no masses,  no organomegaly Extremities: extremities normal, atraumatic, no cyanosis or edema Incision/Wound: C/D/I  Disposition: 01-Home or Self Care  Discharge Instructions    Activity as tolerated - No restrictions    Complete by:  As directed    Call MD for:  difficulty breathing, headache or visual disturbances    Complete by:  As directed    Call MD for:  extreme fatigue    Complete by:  As directed    Call MD for:  extreme fatigue    Complete by:  As directed    Call MD for:  hives    Complete by:  As directed    Call MD for:  persistant dizziness or light-headedness    Complete by:  As directed    Call MD for:  persistant dizziness or light-headedness    Complete  by:  As directed    Call MD for:  persistant nausea and vomiting    Complete by:  As directed    Call MD for:  persistant nausea and vomiting    Complete by:  As directed    Call MD for:  redness, tenderness, or signs of infection (pain, swelling, redness, odor or green/yellow discharge around incision site)    Complete by:  As directed    Call MD for:  redness, tenderness, or signs of infection (pain, swelling, redness, odor or green/yellow discharge around incision site)    Complete by:  As directed    Call MD for:  severe uncontrolled pain    Complete by:  As directed    Call MD for:  severe uncontrolled pain    Complete by:  As directed    Call MD for:  temperature >100.4    Complete by:  As directed    Call MD for:  temperature >100.4    Complete by:  As directed    Diet - low sodium heart healthy    Complete by:  As directed    Diet - low sodium heart healthy    Complete by:  As directed    Diet Carb Modified    Complete by:  As directed    Diet general    Complete by:  As directed    Discharge instructions    Complete by:  As directed    Activity: 1. Be up and out of the bed during the day.  Take a nap if needed.  You may walk up steps but be careful and use the hand rail.  Stair climbing will tire you more than you think, you may need to stop part way and rest.   2. No lifting or straining for 6 weeks.  3. No driving for 1-2 weeks.  Do Not drive if you are taking narcotic pain medicine.  4. Shower daily.  Use soap and water on your incision and pat dry; don't rub.   5. No sexual activity and nothing in the vagina for 4 weeks.  Diet: 1. Low sodium Heart Healthy Diet is recommended.  2. It is safe to use a laxative if you have difficulty moving your bowels.   Wound Care: 1. Keep clean and dry.  Shower daily.  Reasons to call the Doctor:  Fever - Oral temperature greater than 100.4 degrees Fahrenheit Foul-smelling vaginal discharge Difficulty urinating Nausea  and vomiting Increased pain at the site of the incision that is unrelieved with pain medicine. Difficulty breathing with or without chest pain New calf pain especially if only on one side Sudden, continuing increased vaginal bleeding with or without clots.   Follow-up: 1. See Everitt Amber in 4 weeks.  Contacts: For questions or concerns you should contact:  Dr. Everitt Amber at [phone]  or at [place]   Discharge wound care:    Complete by:  As directed    Keep clean and dry   Driving Restrictions    Complete by:  As directed    No driving for 2 weeks if you were cleared to drive before surgery.  Do not take narcotics and drive.   Driving Restrictions    Complete by:  As directed    No driving for 1- 2 weeks   Increase activity slowly    Complete by:  As directed    Increase activity slowly    Complete by:  As directed    Lifting restrictions    Complete by:  As directed    No lifting greater than 10 lbs.   Lifting restrictions    Complete by:  As directed    No lifting > 5 lbs for 6 weeks   May shower / Bathe    Complete by:  As directed    No tub baths for 6 weeks   Sexual Activity Restrictions    Complete by:  As directed    No sexual activity, nothing in the vagina, for 4-6 weeks.   Sexual Activity Restrictions    Complete by:  As directed    No intercourse for 6 - 8 weeks     Allergies as of 01/08/2017   No Known Allergies     Medication List    STOP taking these medications   furosemide 40 MG tablet Commonly known as:  LASIX     TAKE these medications   aspirin EC 81 MG tablet Take 81 mg by mouth every morning.   enoxaparin 40 MG/0.4ML injection Commonly known as:  LOVENOX Inject 0.4 mLs (40 mg total) into the skin daily.   levothyroxine 25 MCG tablet Commonly known as:  SYNTHROID, LEVOTHROID Take 25 mcg by mouth daily before breakfast.   lidocaine-prilocaine cream Commonly known as:  EMLA Apply a quarter size amount to affected  area 1 hour prior to coming to chemotherapy.   LORazepam 0.5 MG tablet Commonly known as:  ATIVAN Take 1 tablet (0.5 mg total) by mouth every 8 (eight) hours. What changed:  when to take this  reasons to  take this   megestrol 40 MG/ML suspension Commonly known as:  MEGACE Take 2 mLs (80 mg total) by mouth 2 (two) times daily.   metFORMIN 500 MG 24 hr tablet Commonly known as:  GLUCOPHAGE-XR Take 500 mg by mouth every evening.   naproxen sodium 220 MG tablet Commonly known as:  ANAPROX Take 220 mg by mouth daily as needed (pain).   omeprazole 20 MG capsule Commonly known as:  PRILOSEC Take 1 capsule (20 mg total) by mouth daily.   ondansetron 8 MG disintegrating tablet Commonly known as:  ZOFRAN-ODT TAKE 1 TABLET (8 MG TOTAL) BY MOUTH EVERY 8 (EIGHT) HOURS AS NEEDED FOR NAUSEA OR VOMITING.   ondansetron 4 MG disintegrating tablet Commonly known as:  ZOFRAN ODT 4mg  ODT q4 hours prn nausea/vomit   polyethylene glycol powder powder Commonly known as:  GLYCOLAX/MIRALAX Take 1 capful daily. What changed:  how much to take  how to take this  when to take this  reasons to take this  additional instructions   Potassium Chloride ER 20 MEQ Tbcr Take 20 mEq by mouth 2 (two) times daily.   pravastatin 80 MG tablet Commonly known as:  PRAVACHOL Take 80 mg by mouth daily.   prochlorperazine 10 MG tablet Commonly known as:  COMPAZINE Take 1 tablet (10 mg total) by mouth every 6 (six) hours as needed for nausea or vomiting.   senna 8.6 MG Tabs tablet Commonly known as:  SENOKOT Take 1 tablet by mouth daily as needed for mild constipation.   traMADol 50 MG tablet Commonly known as:  ULTRAM Take 1 tablet (50 mg total) by mouth every 6 (six) hours as needed (pain).   valsartan 80 MG tablet Commonly known as:  DIOVAN Take 80 mg by mouth daily.   Vitamin D 2000 units tablet Take 2,000 Units by mouth daily.            Discharge Care Instructions         Start     Ordered   01/08/17 0000  Discharge wound care:    Comments:  Keep clean and dry   01/08/17 8676     Follow-up Information    Everitt Amber, MD Follow up in 2 week(s).   Specialty:  Obstetrics and Gynecology Contact information: [place] [address] [phone]        [facility] Follow [facility].   Why:  nurse  Contact information: [address] [place] [address] [phone]           Signed: Agnes Lawrence 01/08/2017, 9:25 AM

## 2017-01-10 ENCOUNTER — Telehealth: Payer: Self-pay | Admitting: *Deleted

## 2017-01-10 ENCOUNTER — Ambulatory Visit: Payer: Medicare Other | Admitting: Gynecologic Oncology

## 2017-01-10 NOTE — Telephone Encounter (Signed)
Returned patient's daughter message regarding her mother's catheter. Per Lenna Sciara APP informed the patient's daughter" the patient is to keep the catheter until her follow up visit on November 14th, with a scan before." the patient's daughter stated that "the patient is having some burning with the catheter and would like to be seen next week." Informed the daughter that "Dr. Denman George is gone for the day and in surgery tomorrow, we will call you tomorrow afternoon."

## 2017-01-12 ENCOUNTER — Other Ambulatory Visit: Payer: Self-pay | Admitting: Gynecologic Oncology

## 2017-01-12 ENCOUNTER — Telehealth: Payer: Self-pay | Admitting: *Deleted

## 2017-01-12 DIAGNOSIS — Z960 Presence of urogenital implants: Principal | ICD-10-CM

## 2017-01-12 DIAGNOSIS — Z978 Presence of other specified devices: Secondary | ICD-10-CM

## 2017-01-12 NOTE — Telephone Encounter (Signed)
Contacted the patient's daughter and gave her the date/time of the cystogram. The scan is for November 6th at 1:30pm, arrive at 1:15pm. Following the scan the patient will see Melissa APP at 3pm.

## 2017-01-18 ENCOUNTER — Ambulatory Visit (HOSPITAL_COMMUNITY)
Admission: RE | Admit: 2017-01-18 | Discharge: 2017-01-18 | Disposition: A | Discharge status: Home / Self Care | Payer: Medicare Other | Source: Ambulatory Visit | Attending: Gynecologic Oncology | Admitting: Gynecologic Oncology

## 2017-01-18 ENCOUNTER — Ambulatory Visit: Payer: Medicare Other | Admitting: Gynecologic Oncology

## 2017-01-18 ENCOUNTER — Telehealth: Payer: Self-pay

## 2017-01-18 DIAGNOSIS — Z978 Presence of other specified devices: Secondary | ICD-10-CM

## 2017-01-18 DIAGNOSIS — Z9289 Personal history of other medical treatment: Secondary | ICD-10-CM | POA: Insufficient documentation

## 2017-01-18 DIAGNOSIS — Z960 Presence of urogenital implants: Secondary | ICD-10-CM

## 2017-01-18 MED ORDER — IOTHALAMATE MEGLUMINE 17.2 % UR SOLN: 150.0000 mL | Freq: Once | URETHRAL | Status: DC | PRN

## 2017-01-18 NOTE — Telephone Encounter (Signed)
Results of the cystogram as noted below were reviewed with Dr. Denman George and Ms Magloire was cleared to have foley catheter removed. Removed foley catheter.  Pt.'s leg bag had 300 cc of urine. Instructed Ms Defelice and daughter to measure her urine output between now and follow up visit on 01-26-17.  Gave patient urine collection container for commode.  If urine out put is < 200 cc when she voids it could indicate that she is not emptying bladder completely.  She is to call before 01-26-17 if she develops abdominal pain  or pressure as she may be retaining urine.  Application for disability parking placard completed and given to daughter to bring to the [facility].

## 2017-01-18 NOTE — Telephone Encounter (Signed)
   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 Status post resection and bladder fixation. Evaluate for bladder leak.  EXAM: CYSTOGRAM  TECHNIQUE: After catheterization of the urinary bladder following sterile technique the bladder was filled with 250 mL Cysto-Hypaque 30% by drip infusion. Serial spot images were obtained during bladder filling and post draining.  FLUOROSCOPY TIME:  Fluoroscopy Time:  1 minutes and 0 seconds  Radiation Exposure Index (if provided by the fluoroscopic device): 15.9 mGy  Number of Acquired Spot Images: 0  COMPARISON:  None.  FINDINGS: Normal appearance of the bladder except for some areas of trabeculation. No bladder leak was demonstrated.  IMPRESSION: No evidence of bladder leak/extravasation.   Electronically Signed   By: Marijo Sanes M.D.   On: 01/18/2017 14:46  Order-Level Documents:   There are no order-level documents.  Encounter-Level Documents:   There are no encounter-level documents.  Vitals   Height Weight BMI (Calculated) 5\' 3"  (1.6 m) 122 lb 4.8 oz (55.5 kg) 21.67 Protocol Documents   Imaging Protocol External Results Report   Open External Results Report Imaging   Imaging Information Signed by   Signed Date/Time  Phone Pager Kalman Jewels 01/18/2017 2:46 PM [phone] [id number] Signed   Electronically signed by Kalman Jewels, MD on 01/18/17 at 1446 EST Study Notes     Bertram Gala on 01/18/2017 2:41 PM Hx. ovarian Ca with invasion to abdominal wall and dome of bladder, post op  check for bladder leak. Fluoro time 1.0 min, 15.9 mGy,  Cysto-Conray 240ml contrast media,  150 ml used for exam.  Original Order   Ordered On Ordered By  01/12/2017 8:03 AM Dorothyann Gibbs, NP

## 2017-01-26 ENCOUNTER — Encounter: Payer: Self-pay | Admitting: Gynecologic Oncology

## 2017-01-26 ENCOUNTER — Ambulatory Visit: Payer: Medicare Other | Attending: Gynecologic Oncology | Admitting: Gynecologic Oncology

## 2017-01-26 VITALS — BP 137/66 | HR 66 | Temp 98.1°F | Resp 18 | Wt 113.5 lb

## 2017-01-26 DIAGNOSIS — E119 Type 2 diabetes mellitus without complications: Secondary | ICD-10-CM | POA: Diagnosis not present

## 2017-01-26 DIAGNOSIS — R5381 Other malaise: Secondary | ICD-10-CM | POA: Diagnosis not present

## 2017-01-26 DIAGNOSIS — I10 Essential (primary) hypertension: Secondary | ICD-10-CM | POA: Insufficient documentation

## 2017-01-26 DIAGNOSIS — K219 Gastro-esophageal reflux disease without esophagitis: Secondary | ICD-10-CM | POA: Diagnosis not present

## 2017-01-26 DIAGNOSIS — Z7984 Long term (current) use of oral hypoglycemic drugs: Secondary | ICD-10-CM | POA: Insufficient documentation

## 2017-01-26 DIAGNOSIS — Z79899 Other long term (current) drug therapy: Secondary | ICD-10-CM | POA: Insufficient documentation

## 2017-01-26 DIAGNOSIS — E43 Unspecified severe protein-calorie malnutrition: Secondary | ICD-10-CM | POA: Diagnosis not present

## 2017-01-26 DIAGNOSIS — Z7189 Other specified counseling: Secondary | ICD-10-CM

## 2017-01-26 DIAGNOSIS — D509 Iron deficiency anemia, unspecified: Secondary | ICD-10-CM | POA: Insufficient documentation

## 2017-01-26 DIAGNOSIS — Z7982 Long term (current) use of aspirin: Secondary | ICD-10-CM | POA: Insufficient documentation

## 2017-01-26 DIAGNOSIS — Z90710 Acquired absence of both cervix and uterus: Secondary | ICD-10-CM | POA: Diagnosis not present

## 2017-01-26 DIAGNOSIS — C786 Secondary malignant neoplasm of retroperitoneum and peritoneum: Secondary | ICD-10-CM | POA: Diagnosis not present

## 2017-01-26 DIAGNOSIS — C481 Malignant neoplasm of specified parts of peritoneum: Secondary | ICD-10-CM

## 2017-01-26 DIAGNOSIS — E039 Hypothyroidism, unspecified: Secondary | ICD-10-CM | POA: Diagnosis not present

## 2017-01-26 DIAGNOSIS — E7800 Pure hypercholesterolemia, unspecified: Secondary | ICD-10-CM | POA: Diagnosis not present

## 2017-01-26 DIAGNOSIS — C801 Malignant (primary) neoplasm, unspecified: Secondary | ICD-10-CM

## 2017-01-26 NOTE — Progress Notes (Signed)
Consult Note: Gyn-Onc  Consult was requested by Robynn Pane PA for the evaluation of Sherry Hopkins 76 y.o. female  CC:  Chief Complaint  Patient presents with  . Peritoneal carcinomatosis ([place])  . Extraovarian primary peritoneal carcinoma ([place] )  . Other specified counseling    Assessment/Plan:  [name]  is a 76 y.o.  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 She has severe protein malnutrition and debilitation.  I am recommending that she resume chemotherapy with Dr Talbert Cage. I recommend continuing carboplatin and paclitaxel. She could consider weekly taxol which may have some benefit in patients with suboptimal resection. I would not recommend bevacizumab for at least 6 weeks given history of cystotomy and risk of fistula formation or wound healing concerns. Chemotherapy should be continued until there is no measurable disease on tumor markers or CT scan, or until there is progression.  I discussed with the patient and her daughter that prognosis is poor with her presentation and life expectancy I anticipate to be limited to less than a year.   HPI: Sherry Hopkins is a 76 year old P2 who is seen in consultation at the request of Robynn Pane PA for peritoneal carcinomatosis.  The patient began feeling abdominal distension in May, 2018. She was seen by an urgent care and a CT chest was normal. She was treated for reflux with medical management. Her symptoms did not resolve.  She was taken to the [facility] ED on 10/07/16 with persistent symptoms of dyspepsia, abdominal distension, anorexia, weight loss, reflux, and occasional emesis.  A CT abd/pelvis was performed which showed evidence of extensive peritoneal carcinomatosis ncluding large volume of loculated ascites, omental caking, and calcified peritoneal deposits. The patient is status post hysterectomy. If the patient still has ovaries  then this would be a likely origin. Alternatively GI malignancies may present with peritoneal carcinomatosis. At this time there is no evidence for high-grade bowel obstruction or obstructive uropathy.  She CA 125 drawn on 10/11/16 which was elevated at 4,702.  Of note, total albumin was decreased at 2.9 indicating severe malnutrition.  CT was performed of the chest on 10/19/16 which was unremarkable for evidence of metastatic disease.  She is an otherwise fairly healthy woman with a history of hypertension, diet controlled DM and hypothyroidism. She has had a prior abdominal hysterectomy (not oophorectomy) for abnormal bleeding in her 40's.  She was deemed to not be a good operative candidate for primary debulking due to her very poor nutritional and performance status and a plan was made for neoadjuvant chemotherapy with 3 cycles of carb/tax.  Day 1 of cycle 1 was 11/03/16 and day 1 of cycle 3 was 12/15/16. Dr Shaune Pollack adm[facility]. CA 125 was not assessed during therapy.  During treatment she regained some strength and appetite, though she still remains fairly weak. She required IV fluids during the off weaks.   CT scan chest/abdo/pelvis on 12/24/16 showed small volume malignant ascites, decreased. For example, there is malignant ascites along the left hepatic lobe and stomach along the hepatic renal fossa and in the right dependent pelvis adjacent to the rectum.Calcified omental caking beneath the midline anterior abdominal wall, measuring 3.4 x 13.2 cm grossly unchanged.  Albumin on 12/15/16: 3.4 (lower limit normal 3.5).   Interval Hx: On 01/04/17 she underwent attempted interval debulking with exploratory laparotomy. At the time of surgery a 20cm tumor plaque was identified that was the omentum, it  invested the transverse colon and was infiltrative into the entire mid abdominal wall and dome of the bladder such that an unavoidable cystotomy was created in an attempt to  resect it. Given that her nutritional status was so poor, and her response to chemotherapy fairly modest, and because resection would have required removal of most of her anterior abdominal wall fascia, a decision was made to abort the attempt to debulk, and instead the cystotomy was closed.  Postoperatively she healed her cystotomy incision and retrograde cystogram on 01/18/17 showed no leak and therefore the foley was removed.  Since surgery she has done fairly well. She has lost approximately 8 lbs but states that she has a good appetite and is eating well. She denies hematuria. She does notice some pulling discomfort with urination that is likely secondary from tumor adhesions.   Current Meds:  Outpatient Encounter Medications as of 01/26/2017  Medication Sig  . aspirin EC 81 MG tablet Take 81 mg by mouth every morning.  . Cholecalciferol (VITAMIN D) 2000 units tablet Take 2,000 Units by mouth daily.  Marland Kitchen enoxaparin (LOVENOX) 40 MG/0.4ML injection Inject 0.4 mLs (40 mg total) into the skin daily.  Marland Kitchen levothyroxine (SYNTHROID, LEVOTHROID) 25 MCG tablet Take 25 mcg by mouth daily before breakfast.  . lidocaine-prilocaine (EMLA) cream Apply a quarter size amount to affected area 1 hour prior to coming to chemotherapy.  Marland Kitchen LORazepam (ATIVAN) 0.5 MG tablet Take 1 tablet (0.5 mg total) by mouth every 8 (eight) hours. (Patient taking differently: Take 0.5 mg by mouth every 8 (eight) hours as needed for anxiety (and nausea). )  . megestrol (MEGACE) 40 MG/ML suspension Take 2 mLs (80 mg total) by mouth 2 (two) times daily.  . metFORMIN (GLUCOPHAGE-XR) 500 MG 24 hr tablet Take 500 mg by mouth every evening.   . naproxen sodium (ANAPROX) 220 MG tablet Take 220 mg by mouth daily as needed (pain).   Marland Kitchen omeprazole (PRILOSEC) 20 MG capsule Take 1 capsule (20 mg total) by mouth daily.  . ondansetron (ZOFRAN ODT) 4 MG disintegrating tablet 4mg  ODT q4 hours prn nausea/vomit  . ondansetron (ZOFRAN-ODT) 8 MG  disintegrating tablet TAKE 1 TABLET (8 MG TOTAL) BY MOUTH EVERY 8 (EIGHT) HOURS AS NEEDED FOR NAUSEA OR VOMITING.  . polyethylene glycol powder (GLYCOLAX/MIRALAX) powder Take 1 capful daily. (Patient taking differently: Take 17 g by mouth daily as needed for mild constipation. )  . Potassium Chloride ER 20 MEQ TBCR Take 20 mEq by mouth 2 (two) times daily.  . pravastatin (PRAVACHOL) 80 MG tablet Take 80 mg by mouth daily.   . prochlorperazine (COMPAZINE) 10 MG tablet Take 1 tablet (10 mg total) by mouth every 6 (six) hours as needed for nausea or vomiting.  . senna (SENOKOT) 8.6 MG TABS tablet Take 1 tablet by mouth daily as needed for mild constipation.  . traMADol (ULTRAM) 50 MG tablet Take 1 tablet (50 mg total) by mouth every 6 (six) hours as needed (pain).  . valsartan (DIOVAN) 80 MG tablet Take 80 mg by mouth daily.   No facility-administered encounter medications on file as of 01/26/2017.     Allergy: No Known Allergies  Social Hx:   Social History   Socioeconomic History  . Marital status: Divorced    Spouse name: Not on file  . Number of children: Not on file  . Years of education: Not on file  . Highest education level: Not on file  Social Needs  . Financial resource strain: Not  on file  . Food insecurity - worry: Not on file  . Food insecurity - inability: Not on file  . Transportation needs - medical: Not on file  . Transportation needs - non-medical: Not on file  Occupational History  . Not on file  Tobacco Use  . Smoking status: Never Smoker  . Smokeless tobacco: Never Used  Substance and Sexual Activity  . Alcohol use: No  . Drug use: No  . Sexual activity: Not Currently    Birth control/protection: Surgical  Other Topics Concern  . Not on file  Social History Narrative  . Not on file    Past Surgical Hx:  Past Surgical History:  Procedure Laterality Date  . ABDOMINAL HYSTERECTOMY      Past Medical Hx:  Past Medical History:  Diagnosis Date  .  Diabetes mellitus type 2, diet-controlled ([facility])    diet controlled  . GERD (gastroesophageal reflux disease)   . Hypercholesteremia   . Hypertension   . Hypothyroidism   . Iron deficiency anemia 10/12/2016  . Peritoneal carcinomatosis ([place]) 10/11/2016    Past Gynecological History:  SVD x 2 No LMP recorded. Patient has had a hysterectomy.  Family Hx: History reviewed. No pertinent family history.  Review of Systems:  Constitutional  Feels fatigued and unwell,    ENT Normal appearing ears and nares bilaterally.  Skin/Breast  No rash, sores, jaundice, itching, dryness Cardiovascular  No chest pain, shortness of breath, or edema  Pulmonary  No cough or wheeze.  Gastro Intestinal  + anorexia, + decreased BM, no emesis  Genito Urinary  No frequency, urgency, dysuria,  Musculo Skeletal  No myalgia, arthralgia, joint swelling or pain  Neurologic  No weakness, numbness, change in gait,  Psychology  No depression, anxiety, insomnia.   Vitals:  Blood pressure 137/66, pulse 66, temperature 98.1 F (36.7 C), temperature source Oral, resp. rate 18, weight 113 lb 8 oz (51.5 kg), SpO2 100 %.  Physical Exam: WD Sunken cheeks Neck  Supple NROM, without any enlargements.  Lymph Node Survey No cervical supraclavicular or inguinal adenopathy Cardiovascular  Pulse normal rate, regularity and rhythm. S1 and S2 normal.  Lungs  Clear to auscultation bilateraly, without wheezes/crackles/rhonchi. Good air movement.  Skin  No rash/lesions/breakdown  Psychiatry  Alert and oriented to person, place, and time  Abdomen  Normoactive bowel sounds, abdomen soft, non-tender and thin, nondistended. Firm mass in lower abdomen consistent with omental cake. Incision well healed with skin glue in place. No erythema/drainage/cellulitis. Back No CVA tenderness Genito Urinary  deferred Rectal  deferred Extremities  No bilateral cyanosis, clubbing or edema.  20 minutes of direct face to face  counseling time was spent with the patient. This included discussion about prognosis, therapy recommendations and postoperative side effects and are beyond the scope of routine postoperative care.   Donaciano Eva, MD  01/26/2017, 5:35 PM

## 2017-01-26 NOTE — Patient Instructions (Signed)
Dr Laverle Patter office will contact you with information about your next chemotherapy dose.  It is safe for you to shower and get your incision wet.  Dr Talbert Cage will send you back to see Dr Denman George for discussion about treatment options should your cancer show signs of not responding to the chemotherapy.

## 2017-02-13 ENCOUNTER — Emergency Department (HOSPITAL_COMMUNITY): Payer: Medicare Other

## 2017-02-13 ENCOUNTER — Other Ambulatory Visit: Payer: Self-pay

## 2017-02-13 ENCOUNTER — Encounter (HOSPITAL_COMMUNITY): Payer: Self-pay | Admitting: Cardiology

## 2017-02-13 ENCOUNTER — Inpatient Hospital Stay (HOSPITAL_COMMUNITY)
Admission: EM | Admit: 2017-02-13 | Discharge: 2017-02-15 | DRG: 388 | Disposition: A | Discharge status: Hospice | Payer: Medicare Other | Attending: Internal Medicine | Admitting: Internal Medicine

## 2017-02-13 DIAGNOSIS — Z9842 Cataract extraction status, left eye: Secondary | ICD-10-CM | POA: Diagnosis not present

## 2017-02-13 DIAGNOSIS — K219 Gastro-esophageal reflux disease without esophagitis: Secondary | ICD-10-CM | POA: Diagnosis present

## 2017-02-13 DIAGNOSIS — I10 Essential (primary) hypertension: Secondary | ICD-10-CM | POA: Diagnosis present

## 2017-02-13 DIAGNOSIS — Z6820 Body mass index (BMI) 20.0-20.9, adult: Secondary | ICD-10-CM | POA: Diagnosis not present

## 2017-02-13 DIAGNOSIS — E039 Hypothyroidism, unspecified: Secondary | ICD-10-CM | POA: Diagnosis present

## 2017-02-13 DIAGNOSIS — R188 Other ascites: Secondary | ICD-10-CM | POA: Diagnosis present

## 2017-02-13 DIAGNOSIS — Z90710 Acquired absence of both cervix and uterus: Secondary | ICD-10-CM

## 2017-02-13 DIAGNOSIS — Z961 Presence of intraocular lens: Secondary | ICD-10-CM | POA: Diagnosis present

## 2017-02-13 DIAGNOSIS — K56609 Unspecified intestinal obstruction, unspecified as to partial versus complete obstruction: Principal | ICD-10-CM | POA: Diagnosis present

## 2017-02-13 DIAGNOSIS — L8990 Pressure ulcer of unspecified site, unspecified stage: Secondary | ICD-10-CM | POA: Diagnosis present

## 2017-02-13 DIAGNOSIS — Z9221 Personal history of antineoplastic chemotherapy: Secondary | ICD-10-CM | POA: Diagnosis not present

## 2017-02-13 DIAGNOSIS — C801 Malignant (primary) neoplasm, unspecified: Secondary | ICD-10-CM

## 2017-02-13 DIAGNOSIS — Z95828 Presence of other vascular implants and grafts: Secondary | ICD-10-CM | POA: Diagnosis not present

## 2017-02-13 DIAGNOSIS — C786 Secondary malignant neoplasm of retroperitoneum and peritoneum: Secondary | ICD-10-CM

## 2017-02-13 DIAGNOSIS — Z66 Do not resuscitate: Secondary | ICD-10-CM | POA: Diagnosis present

## 2017-02-13 DIAGNOSIS — C482 Malignant neoplasm of peritoneum, unspecified: Secondary | ICD-10-CM | POA: Diagnosis present

## 2017-02-13 DIAGNOSIS — R64 Cachexia: Secondary | ICD-10-CM | POA: Diagnosis present

## 2017-02-13 DIAGNOSIS — Z515 Encounter for palliative care: Secondary | ICD-10-CM | POA: Diagnosis not present

## 2017-02-13 DIAGNOSIS — Z79899 Other long term (current) drug therapy: Secondary | ICD-10-CM | POA: Diagnosis not present

## 2017-02-13 DIAGNOSIS — D509 Iron deficiency anemia, unspecified: Secondary | ICD-10-CM | POA: Diagnosis present

## 2017-02-13 DIAGNOSIS — M6258 Muscle wasting and atrophy, not elsewhere classified, other site: Secondary | ICD-10-CM | POA: Diagnosis present

## 2017-02-13 DIAGNOSIS — E785 Hyperlipidemia, unspecified: Secondary | ICD-10-CM | POA: Diagnosis present

## 2017-02-13 DIAGNOSIS — E119 Type 2 diabetes mellitus without complications: Secondary | ICD-10-CM | POA: Diagnosis present

## 2017-02-13 DIAGNOSIS — Z9841 Cataract extraction status, right eye: Secondary | ICD-10-CM

## 2017-02-13 DIAGNOSIS — C481 Malignant neoplasm of specified parts of peritoneum: Secondary | ICD-10-CM | POA: Diagnosis present

## 2017-02-13 DIAGNOSIS — Z7189 Other specified counseling: Secondary | ICD-10-CM

## 2017-02-13 DIAGNOSIS — E7800 Pure hypercholesterolemia, unspecified: Secondary | ICD-10-CM | POA: Diagnosis present

## 2017-02-13 DIAGNOSIS — E43 Unspecified severe protein-calorie malnutrition: Secondary | ICD-10-CM | POA: Diagnosis present

## 2017-02-13 LAB — CBC
HEMATOCRIT: 35.5 % — AB (ref 36.0–46.0)
HEMOGLOBIN: 11 g/dL — AB (ref 12.0–15.0)
MCH: 28.4 pg (ref 26.0–34.0)
MCHC: 31 g/dL (ref 30.0–36.0)
MCV: 91.7 fL (ref 78.0–100.0)
Platelets: 280 10*3/uL (ref 150–400)
RBC: 3.87 MIL/uL (ref 3.87–5.11)
RDW: 16.6 % — AB (ref 11.5–15.5)
WBC: 6.7 10*3/uL (ref 4.0–10.5)

## 2017-02-13 LAB — URINALYSIS, ROUTINE W REFLEX MICROSCOPIC
BACTERIA UA: NONE SEEN
Bilirubin Urine: NEGATIVE
GLUCOSE, UA: NEGATIVE mg/dL
Hgb urine dipstick: NEGATIVE
KETONES UR: 20 mg/dL — AB
Nitrite: NEGATIVE
PROTEIN: NEGATIVE mg/dL
Specific Gravity, Urine: >1.046 — ABNORMAL HIGH (ref 1.005–1.030)
pH: 5 (ref 5.0–8.0)

## 2017-02-13 LAB — COMPREHENSIVE METABOLIC PANEL
ALT: 11 U/L — ABNORMAL LOW (ref 14–54)
ANION GAP: 12 (ref 5–15)
AST: 19 U/L (ref 15–41)
Albumin: 3.9 g/dL (ref 3.5–5.0)
Alkaline Phosphatase: 62 U/L (ref 38–126)
BILIRUBIN TOTAL: 0.8 mg/dL (ref 0.3–1.2)
BUN: 16 mg/dL (ref 6–20)
CO2: 25 mmol/L (ref 22–32)
Calcium: 9.4 mg/dL (ref 8.9–10.3)
Chloride: 102 mmol/L (ref 101–111)
Creatinine, Ser: 0.79 mg/dL (ref 0.44–1.00)
GFR calc Af Amer: >60 mL/min (ref >60)
Glucose, Bld: 95 mg/dL (ref 65–99)
POTASSIUM: 4.2 mmol/L (ref 3.5–5.1)
Sodium: 139 mmol/L (ref 135–145)
TOTAL PROTEIN: 7.4 g/dL (ref 6.5–8.1)

## 2017-02-13 LAB — GLUCOSE, CAPILLARY: GLUCOSE-CAPILLARY: 92 mg/dL (ref 65–99)

## 2017-02-13 LAB — LIPASE, BLOOD: Lipase: 21 U/L (ref 11–51)

## 2017-02-13 MED ORDER — IOHEXOL 240 MG/ML SOLN: 50.0000 mL | Freq: Once | INTRAMUSCULAR | Status: DC | PRN

## 2017-02-13 MED ORDER — ONDANSETRON HCL 4 MG/2ML IJ SOLN
4.0000 mg | Freq: Four times a day (QID) | INTRAMUSCULAR | Status: DC | PRN
  Administered 2017-02-13 – 2017-02-15 (×3): 4 mg via INTRAVENOUS
  Filled 2017-02-13 (×3): qty 2

## 2017-02-13 MED ORDER — ENOXAPARIN SODIUM 40 MG/0.4ML ~~LOC~~ SOLN
40.0000 mg | SUBCUTANEOUS | Status: DC
  Administered 2017-02-13: 40 mg via SUBCUTANEOUS
  Filled 2017-02-13 (×2): qty 0.4

## 2017-02-13 MED ORDER — ONDANSETRON HCL 4 MG PO TABS: 4.0000 mg | ORAL_TABLET | Freq: Four times a day (QID) | ORAL | Status: DC | PRN

## 2017-02-13 MED ORDER — IOPAMIDOL (ISOVUE-300) INJECTION 61%
50.0000 mL | Freq: Once | INTRAVENOUS | Status: AC | PRN
  Administered 2017-02-13: 30 mL via ORAL

## 2017-02-13 MED ORDER — ACETAMINOPHEN 650 MG RE SUPP: 650.0000 mg | Freq: Four times a day (QID) | RECTAL | Status: DC | PRN

## 2017-02-13 MED ORDER — SODIUM CHLORIDE 0.9 % IV BOLUS (SEPSIS)
1000.0000 mL | Freq: Once | INTRAVENOUS | Status: AC
  Administered 2017-02-13: 1000 mL via INTRAVENOUS

## 2017-02-13 MED ORDER — ONDANSETRON HCL 4 MG/2ML IJ SOLN
4.0000 mg | Freq: Once | INTRAMUSCULAR | Status: AC
  Administered 2017-02-13: 4 mg via INTRAVENOUS
  Filled 2017-02-13: qty 2

## 2017-02-13 MED ORDER — LEVOTHYROXINE SODIUM 25 MCG PO TABS
25.0000 ug | ORAL_TABLET | Freq: Every day | ORAL | Status: DC
  Administered 2017-02-14: 25 ug via ORAL
  Filled 2017-02-13 (×2): qty 1

## 2017-02-13 MED ORDER — MORPHINE SULFATE (PF) 2 MG/ML IV SOLN
2.0000 mg | INTRAVENOUS | Status: DC | PRN
  Administered 2017-02-14 (×2): 2 mg via INTRAVENOUS
  Filled 2017-02-13 (×2): qty 1

## 2017-02-13 MED ORDER — LACTATED RINGERS IV SOLN
INTRAVENOUS | Status: DC
  Administered 2017-02-13 – 2017-02-15 (×4): via INTRAVENOUS

## 2017-02-13 MED ORDER — HYDROMORPHONE HCL 1 MG/ML IJ SOLN
0.5000 mg | Freq: Once | INTRAMUSCULAR | Status: AC
  Administered 2017-02-13: 0.5 mg via INTRAVENOUS
  Filled 2017-02-13: qty 1

## 2017-02-13 MED ORDER — INSULIN ASPART 100 UNIT/ML ~~LOC~~ SOLN
0.0000 [IU] | Freq: Three times a day (TID) | SUBCUTANEOUS | Status: DC
Start: 2017-02-14 — End: 2017-02-15

## 2017-02-13 MED ORDER — IOPAMIDOL (ISOVUE-300) INJECTION 61%
100.0000 mL | Freq: Once | INTRAVENOUS | Status: AC | PRN
  Administered 2017-02-13: 100 mL via INTRAVENOUS

## 2017-02-13 MED ORDER — IOPAMIDOL (ISOVUE-300) INJECTION 61%
INTRAVENOUS | Status: AC
  Filled 2017-02-13: qty 50

## 2017-02-13 MED ORDER — ACETAMINOPHEN 325 MG PO TABS: 650.0000 mg | ORAL_TABLET | Freq: Four times a day (QID) | ORAL | Status: DC | PRN

## 2017-02-13 NOTE — ED Provider Notes (Signed)
[facility] EMERGENCY DEPARTMENT Provider Note   CSN: [id number] Arrival date & time: 02/13/17  1043     History   Chief Complaint Chief Complaint  Patient presents with  . Abdominal Pain    HPI Sherry Hopkins is a 76 y.o. female.  Patient c/o bilateral mid to lower abdominal pain in the past 2 days. Pain constant, dull, mod-severe, non radiating. +nausea and vomiting. Is having normal bms. Denies dysuria or gu c/o. Urinating regularly. No fever or chills. Pt has hx peritoneal carcinomatosis, had 3 rounds chemo previously. Last month had intended de-bulking surgery but was aborted due to extensive disease, and pts condition.  Patient indicates since surgery has had relatively little pain up until the past couple of days.    The history is provided by the patient.  Abdominal Pain   Associated symptoms include vomiting. Pertinent negatives include fever, dysuria and headaches.    Past Medical History:  Diagnosis Date  . Diabetes mellitus type 2, diet-controlled (HCC)    diet controlled  . GERD (gastroesophageal reflux disease)   . Hypercholesteremia   . Hypertension   . Hypothyroidism   . Iron deficiency anemia 10/12/2016  . Peritoneal carcinomatosis ([place]) 10/11/2016    Patient Active Problem List   Diagnosis Date Noted  . Primary peritoneal carcinomatosis ([place]) 01/04/2017  . 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 Severe malnutrition ([place]) 10/20/2016  . Peripheral edema 10/14/2016  . Iron deficiency anemia 10/12/2016  . Peritoneal carcinomatosis ([place]) 10/11/2016  . Burning with urination 10/11/2016  . Ascites 10/08/2016  . Nausea with vomiting 10/08/2016    Past Surgical History:  Procedure Laterality Date  . ABDOMINAL HYSTERECTOMY    . CATARACT EXTRACTION W/PHACO  08/23/2011   Procedure: CATARACT EXTRACTION PHACO AND INTRAOCULAR LENS PLACEMENT (IOC);  Surgeon: Tonny Branch, MD;  Location: [facility];  Service: Ophthalmology;  Laterality: Left;   CDE:15.60  . CATARACT EXTRACTION W/PHACO Right 08/16/2013   Procedure: CATARACT EXTRACTION PHACO AND INTRAOCULAR LENS PLACEMENT (IOC);  Surgeon: Tonny Branch, MD;  Location: [facility];  Service: Ophthalmology;  Laterality: Right;  CDE:8.18  . LAPAROTOMY N/A 01/04/2017   Procedure: EXPLORATORY LAPAROTOMY REPAIR OF CYSTOTOMY;  Surgeon: Everitt Amber, MD;  Location: [facility];  Service: Gynecology;  Laterality: N/A;  . PORTACATH PLACEMENT Left 11/01/2016   Procedure: INSERTION PORT-A-CATH LEFT SUBCLAVIAN;  Surgeon: Aviva Signs, MD;  Location: [facility];  Service: General;  Laterality: Left;    OB History    No data available       Home Medications    Prior to Admission medications   Medication Sig Start Date End Date Taking? Authorizing Provider  Cholecalciferol (VITAMIN D) 2000 units tablet Take 2,000 Units by mouth daily.   Yes [provider]  enoxaparin (LOVENOX) 40 MG/0.4ML injection Inject 0.4 mLs (40 mg total) into the skin daily. 01/07/17  Yes Cross, Melissa D, NP  levothyroxine (SYNTHROID, LEVOTHROID) 25 MCG tablet Take 25 mcg by mouth daily before breakfast.   Yes [provider]  lidocaine-prilocaine (EMLA) cream Apply a quarter size amount to affected area 1 hour prior to coming to chemotherapy. 10/25/16  Yes Holley Bouche, NP  megestrol (MEGACE) 40 MG/ML suspension Take 2 mLs (80 mg total) by mouth 2 (two) times daily. 12/15/16  Yes Holley Bouche, NP  metFORMIN (GLUCOPHAGE-XR) 500 MG 24 hr tablet Take 500 mg by mouth every evening.    Yes [provider]  naproxen sodium (ANAPROX) 220 MG tablet Take 220 mg by  mouth daily as needed (pain).    Yes [provider]  omeprazole (PRILOSEC) 20 MG capsule Take 1 capsule (20 mg total) by mouth daily. 10/11/16  Yes Baird Cancer, PA-C  ondansetron (ZOFRAN ODT) 4 MG disintegrating tablet 4mg  ODT q4 hours prn nausea/vomit 11/06/16  Yes Milton Ferguson, MD  polyethylene glycol powder (GLYCOLAX/MIRALAX) powder  Take 1 capful daily. Patient taking differently: Take 17 g by mouth daily as needed for mild constipation.  10/28/16  Yes Holley Bouche, NP  Potassium Chloride ER 20 MEQ TBCR Take 20 mEq by mouth 2 (two) times daily. 12/15/16  Yes Holley Bouche, NP  pravastatin (PRAVACHOL) 80 MG tablet Take 80 mg by mouth daily.    Yes [provider]  prochlorperazine (COMPAZINE) 10 MG tablet Take 1 tablet (10 mg total) by mouth every 6 (six) hours as needed for nausea or vomiting. 10/25/16  Yes Holley Bouche, NP  senna (SENOKOT) 8.6 MG TABS tablet Take 1 tablet by mouth daily as needed for mild constipation.   Yes [provider]  traMADol (ULTRAM) 50 MG tablet Take 1 tablet (50 mg total) by mouth every 6 (six) hours as needed (pain). 01/07/17  Yes Cross, Melissa D, NP  LORazepam (ATIVAN) 0.5 MG tablet Take 1 tablet (0.5 mg total) by mouth every 8 (eight) hours. Patient not taking: Reported on 02/13/2017 10/28/16   Holley Bouche, NP  valsartan (DIOVAN) 80 MG tablet Take 80 mg by mouth daily.    [provider]    Family History History reviewed. No pertinent family history.  Social History Social History   Tobacco Use  . Smoking status: Never Smoker  . Smokeless tobacco: Never Used  Substance Use Topics  . Alcohol use: No  . Drug use: No     Allergies   Patient has no known allergies.   Review of Systems Review of Systems  Constitutional: Negative for fever.  HENT: Negative for sore throat.   Eyes: Negative for redness.  Respiratory: Negative for shortness of breath.   Cardiovascular: Negative for chest pain.  Gastrointestinal: Positive for abdominal pain and vomiting.  Genitourinary: Negative for dysuria and flank pain.  Musculoskeletal: Negative for back pain and neck pain.  Skin: Negative for rash.  Neurological: Negative for headaches.  Hematological: Does not bruise/bleed easily.  Psychiatric/Behavioral: Negative for confusion.      Physical Exam Updated Vital Signs BP (!) 135/104   Pulse 94   Temp 97.9 F (36.6 C) (Oral)   Resp 16   Ht 1.575 m (5\' 2" )   Wt 51.3 kg (113 lb)   SpO2 99%   BMI 20.67 kg/m   Physical Exam  Constitutional: She appears well-developed and well-nourished. No distress.  HENT:  Mouth/Throat: Oropharynx is clear and moist.  Eyes: Conjunctivae are normal. No scleral icterus.  Neck: Neck supple. No tracheal deviation present.  Cardiovascular: Normal rate, regular rhythm, normal heart sounds and intact distal pulses.  Pulmonary/Chest: Effort normal and breath sounds normal. No respiratory distress.  Abdominal: Soft. Normal appearance and bowel sounds are normal. She exhibits no distension. There is tenderness.  Lower abd tenderness bil.   Genitourinary:  Genitourinary Comments: No cva tenderness  Musculoskeletal: She exhibits no edema.  Neurological: She is alert.  Skin: Skin is warm and dry. No rash noted. She is not diaphoretic.  Psychiatric: She has a normal mood and affect.  Nursing note and vitals reviewed.    ED Treatments / Results  Labs (all labs  ordered are listed, but only abnormal results are displayed) Results for orders placed or performed during the hospital encounter of 02/13/17  Comprehensive metabolic panel  Result Value Ref Range   Sodium 139 135 - 145 mmol/L   Potassium 4.2 3.5 - 5.1 mmol/L   Chloride 102 101 - 111 mmol/L   CO2 25 22 - 32 mmol/L   Glucose, Bld 95 65 - 99 mg/dL   BUN 16 6 - 20 mg/dL   Creatinine, Ser 0.79 0.44 - 1.00 mg/dL   Calcium 9.4 8.9 - 10.3 mg/dL   Total Protein 7.4 6.5 - 8.1 g/dL   Albumin 3.9 3.5 - 5.0 g/dL   AST 19 15 - 41 U/L   ALT 11 (L) 14 - 54 U/L   Alkaline Phosphatase 62 38 - 126 U/L   Total Bilirubin 0.8 0.3 - 1.2 mg/dL   GFR calc non Af Amer >60 >60 mL/min   GFR calc Af Amer >60 >60 mL/min   Anion gap 12 5 - 15  CBC  Result Value Ref Range   WBC 6.7 4.0 - 10.5 K/uL   RBC 3.87 3.87 - 5.11 MIL/uL   Hemoglobin  11.0 (L) 12.0 - 15.0 g/dL   HCT 35.5 (L) 36.0 - 46.0 %   MCV 91.7 78.0 - 100.0 fL   MCH 28.4 26.0 - 34.0 pg   MCHC 31.0 30.0 - 36.0 g/dL   RDW 16.6 (H) 11.5 - 15.5 %   Platelets 280 150 - 400 K/uL  Lipase, blood  Result Value Ref Range   Lipase 21 11 - 51 U/L   Ct Abdomen Pelvis W Contrast  Result Date: 02/13/2017 CLINICAL DATA:  Two-day history of abdominal pain. History of extra ovarian primary peritoneal carcinoma. EXAM: CT ABDOMEN AND PELVIS WITH CONTRAST TECHNIQUE: Multidetector CT imaging of the abdomen and pelvis was performed using the standard protocol following bolus administration of intravenous contrast. CONTRAST:  100 cc Isovue 300 COMPARISON:  12/24/2016 FINDINGS: Lower chest:  Unremarkable. Hepatobiliary: No focal abnormality within the liver parenchyma. Gallbladder is distended with potential tiny layering gallstones (image 25 series 2). No intrahepatic or extrahepatic biliary dilation. Pancreas: No focal mass lesion. No dilatation of the main duct. No intraparenchymal cyst. No peripancreatic edema. Spleen: No splenomegaly. No focal mass lesion. Adrenals/Urinary Tract: No adrenal nodule or mass. Scarring noted upper pole right kidney. Tiny hypoattenuating lesion in the lower pole left kidney is stable. No hydronephrosis. No hydroureter. Bladder is decompressed. Stomach/Bowel: Stomach is distended. Transverse duodenum is nondistended. No gross small bowel obstruction. Terminal ileum is unremarkable. The appendix is not visualized, but there is no edema or inflammation in the region of the cecum. Colon is diffusely distended down to the sigmoid segment where there is relatively rapid luminal tapering due to fairly marked bowel wall thickening in the sigmoid segment. Multiple ill-defined areas of soft tissue attenuation are clustered within the thickened sigmoid colon compatible with the known peritoneal disease. Vascular/Lymphatic: There is abdominal aortic atherosclerosis without  aneurysm. Reproductive: Neither the uterus nor the ovaries are well seen. Other: Prominent omental caking again noted. Index lesion measured previously at 13.2 x 3.4 cm now measures 14.9 x 3.1 cm. Fluid attenuation disease is seen around the liver tracking into the gallbladder fossa. A amorphous fluid is seen in the pelvis, tracking in between bowel loops. Musculoskeletal: Bone windows reveal no worrisome lytic or sclerotic osseous lesions. IMPRESSION: 1. Diffuse colonic distention down to the level of the proximal to mid sigmoid colon were there  is marked bowel wall thickening and luminal narrowing. Imaging features are compatible with distal colonic obstruction due to the wall thickening/ luminal narrowing. Abnormal soft tissue tracking in between the redundant sigmoid colon loops is consistent with peritoneal disease. The luminal narrowing of the sigmoid colon is well demonstrated on image 69 of series 2, adjacent to the left acetabulum. 2. The slight interval progression of malignant ascites. 3. Calcified omental cake is similar to mildly progressed in the interval. Electronically Signed   By: Misty Stanley M.D.   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 Status post resection and bladder fixation. Evaluate for bladder leak. EXAM: CYSTOGRAM TECHNIQUE: After catheterization of the urinary bladder following sterile technique the bladder was filled with 250 mL Cysto-Hypaque 30% by drip infusion. Serial spot images were obtained during bladder filling and post draining. FLUOROSCOPY TIME:  Fluoroscopy Time:  1 minutes and 0 seconds Radiation Exposure Index (if provided by the fluoroscopic device): 15.9 mGy Number of Acquired Spot Images: 0 COMPARISON:  None. FINDINGS: Normal appearance of the bladder except for some areas of trabeculation. No bladder leak was demonstrated. IMPRESSION: No evidence of bladder  leak/extravasation. Electronically Signed   By: Marijo Sanes M.D.   On: 01/18/2017 14:46    EKG  EKG Interpretation None       Radiology No results found.  Procedures Procedures (including critical care time)  Medications Ordered in ED Medications  sodium chloride 0.9 % bolus 1,000 mL (not administered)  HYDROmorphone (DILAUDID) injection 0.5 mg (not administered)  ondansetron (ZOFRAN) injection 4 mg (not administered)     Initial Impression / Assessment and Plan / ED Course  I have reviewed the triage vital signs and the nursing notes.  Pertinent labs & imaging results that were available during my care of the patient were reviewed by me and considered in my medical decision making (see chart for details).  Iv ns bolus. Dilaudid .5 mg iv. zofran iv.   Reviewed nursing notes and prior charts for additional history.   Labs. CT imaging.   CT c/w progression of disease and colon obstruction.   Obstruction likely not complete, pt still having bms, no severe distension, etc.    Discussed ct w pt/family.    Will admit for ivf, pain and nausea control.  Patient may benefit from palliative care/hospice consult.    Pain and nausea improved meds.  Consulted hospitalists for admission.     Final Clinical Impressions(s) / ED Diagnoses   Final diagnoses:  None    ED Discharge Orders    None       Lajean Saver, MD 02/13/17 1535

## 2017-02-13 NOTE — H&P (Signed)
History and Physical    Evonda Enge VOZ:[id number] DOB: 08/26/1940 DOA: 02/13/2017  PCP: [name], MD Consultants:  Denman George - gyn onc; Talbert Cage - oncology Patient coming from:   Home - lives alone; NOK: Pandora Leiter, daughter, [phone]  Chief Complaint: Abdominal pain  HPI: Sherry Hopkins is a 76 y.o. female with medical history significant of peritoneal carcinomatosis; hypothyroidism; HTN; HLD; and DM presenting with abdominal pain.  She felt like there was "Lots of loud noise going on and it was hurting real bad".  Symptoms started about 2 nights ago.  The pain started spontaneously.  Pain is mostly RLQ but also in the LLQ.  It was "like something was just boiling, you know, loud".  The pain was intermittent but very frequent.  Slight nausea, some emesis in the ER today.  Last Bm was a couple of days ago and it was normal.  No fevers.  No chemo since the operation.  Eating good but unable to gain weight.  Up to 122 after the surgery, now 113 lb.    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  She completed 3 cycles of chemotherapy prior to surgical attempt on 10/23 ("20cm tumor plaque from omentum infiltrating across entire mid abdominal wall and investing the dome of the bladder. No apparent transverse colon obstruction.  Tumor was not resectable without resecting significant portion of bladder and anterior abdominal wall. Given modest response to first 3 cycles of chemotherapy, and persistent poor nutritional status, a radical debulking effort was not felt to be proportionate or curative.   This represented a suboptimal debulking.").  She was last seen by Dr. Denman George after the unsuccessful surgery on 11/14 and was recommended to resume chemotherapy; there has been no further f/u appointment with oncology.  Dr. Denman George discussed the poor prognosis with the patient and her daughter at that time.     ED Course:    Developing colon obstruction, increasing ascites.  Needs admit for IVF, pain control, ?NG tube.    Review of Systems: As per HPI; otherwise review of systems reviewed and negative.   Ambulatory Status:  Ambulates without assistance  Past Medical History:  Diagnosis Date  . Diabetes mellitus type 2, diet-controlled (HCC)    diet controlled  . GERD (gastroesophageal reflux disease)   . Hypercholesteremia   . Hypertension   . Hypothyroidism   . Iron deficiency anemia 10/12/2016  . Peritoneal carcinomatosis ([place]) 10/11/2016    Past Surgical History:  Procedure Laterality Date  . ABDOMINAL HYSTERECTOMY    . CATARACT EXTRACTION W/PHACO  08/23/2011   Procedure: CATARACT EXTRACTION PHACO AND INTRAOCULAR LENS PLACEMENT (IOC);  Surgeon: Tonny Branch, MD;  Location: [facility];  Service: Ophthalmology;  Laterality: Left;  CDE:15.60  . CATARACT EXTRACTION W/PHACO Right 08/16/2013   Procedure: CATARACT EXTRACTION PHACO AND INTRAOCULAR LENS PLACEMENT (IOC);  Surgeon: Tonny Branch, MD;  Location: [facility];  Service: Ophthalmology;  Laterality: Right;  CDE:8.18  . LAPAROTOMY N/A 01/04/2017   Procedure: EXPLORATORY LAPAROTOMY REPAIR OF CYSTOTOMY;  Surgeon: Everitt Amber, MD;  Location: [facility];  Service: Gynecology;  Laterality: N/A;  . PORTACATH PLACEMENT Left 11/01/2016   Procedure: INSERTION PORT-A-CATH LEFT SUBCLAVIAN;  Surgeon: Aviva Signs, MD;  Location: [facility];  Service: General;  Laterality: Left;    Social History   Socioeconomic History  . Marital status: Divorced    Spouse name: Not on file  . Number of children: Not on file  . Years of  education: Not on file  . Highest education level: Not on file  Social Needs  . Financial resource strain: Not on file  . Food insecurity - worry: Not on file  . Food insecurity - inability: Not on file  . Transportation needs - medical: Not on file  . Transportation needs - non-medical: Not on file  Occupational History  . Occupation: retired  Tobacco Use  .  Smoking status: Never Smoker  . Smokeless tobacco: Never Used  Substance and Sexual Activity  . Alcohol use: No  . Drug use: No  . Sexual activity: Not Currently    Birth control/protection: Surgical  Other Topics Concern  . Not on file  Social History Narrative  . Not on file    No Known Allergies  History reviewed. No pertinent family history.  Prior to Admission medications   Medication Sig Start Date End Date Taking? Authorizing Provider  Cholecalciferol (VITAMIN D) 2000 units tablet Take 2,000 Units by mouth daily.   Yes [provider]  enoxaparin (LOVENOX) 40 MG/0.4ML injection Inject 0.4 mLs (40 mg total) into the skin daily. 01/07/17  Yes Cross, Melissa D, NP  levothyroxine (SYNTHROID, LEVOTHROID) 25 MCG tablet Take 25 mcg by mouth daily before breakfast.   Yes [provider]  lidocaine-prilocaine (EMLA) cream Apply a quarter size amount to affected area 1 hour prior to coming to chemotherapy. 10/25/16  Yes Holley Bouche, NP  megestrol (MEGACE) 40 MG/ML suspension Take 2 mLs (80 mg total) by mouth 2 (two) times daily. 12/15/16  Yes Holley Bouche, NP  metFORMIN (GLUCOPHAGE-XR) 500 MG 24 hr tablet Take 500 mg by mouth every evening.    Yes [provider]  naproxen sodium (ANAPROX) 220 MG tablet Take 220 mg by mouth daily as needed (pain).    Yes [provider]  omeprazole (PRILOSEC) 20 MG capsule Take 1 capsule (20 mg total) by mouth daily. 10/11/16  Yes Baird Cancer, PA-C  ondansetron (ZOFRAN ODT) 4 MG disintegrating tablet 4mg  ODT q4 hours prn nausea/vomit 11/06/16  Yes Milton Ferguson, MD  polyethylene glycol powder (GLYCOLAX/MIRALAX) powder Take 1 capful daily. Patient taking differently: Take 17 g by mouth daily as needed for mild constipation.  10/28/16  Yes Holley Bouche, NP  Potassium Chloride ER 20 MEQ TBCR Take 20 mEq by mouth 2 (two) times daily. 12/15/16  Yes Holley Bouche, NP  pravastatin (PRAVACHOL) 80 MG  tablet Take 80 mg by mouth daily.    Yes [provider]  prochlorperazine (COMPAZINE) 10 MG tablet Take 1 tablet (10 mg total) by mouth every 6 (six) hours as needed for nausea or vomiting. 10/25/16  Yes Holley Bouche, NP  senna (SENOKOT) 8.6 MG TABS tablet Take 1 tablet by mouth daily as needed for mild constipation.   Yes [provider]  traMADol (ULTRAM) 50 MG tablet Take 1 tablet (50 mg total) by mouth every 6 (six) hours as needed (pain). 01/07/17  Yes Cross, Melissa D, NP  LORazepam (ATIVAN) 0.5 MG tablet Take 1 tablet (0.5 mg total) by mouth every 8 (eight) hours. Patient not taking: Reported on 02/13/2017 10/28/16   Holley Bouche, NP  valsartan (DIOVAN) 80 MG tablet Take 80 mg by mouth daily.    [provider]    Physical Exam: Vitals:   02/13/17 1630 02/13/17 1640 02/13/17 1700 02/13/17 1749  BP: 138/87 138/87 (!) 143/83   Pulse: 88 87 92   Resp:  18 18  Temp:   98.5 F (36.9 C)   TempSrc:   Oral   SpO2: 98% 98% 100%   Weight:   54.9 kg (121 lb 0.5 oz) 51.5 kg (113 lb 8.6 oz)  Height:   5\' 3"  (1.6 m)      General:  Appears calm and comfortable and is NAD Eyes:  PERRL, EOMI, normal lids, iris ENT:  grossly normal hearing, lips & tongue, mmm Neck:  no LAD, masses or thyromegaly Cardiovascular:  RRR, no m/r/g. No LE edema.  Respiratory:   CTA bilaterally with no wheezes/rales/rhonchi.  Normal respiratory effort. Abdomen:  Taut, generally NT at this time, ND Skin:  no rash or induration seen on limited exam Musculoskeletal:  grossly normal tone BUE/BLE, good ROM, no bony abnormality Lower extremity:  No LE edema.  Limited foot exam with no ulcerations.  2+ distal pulses. Psychiatric:  grossly normal mood and affect, speech fluent and appropriate, AOx3 Neurologic:  CN 2-12 grossly intact, moves all extremities in coordinated fashion, sensation intact    Radiological Exams on Admission: Ct Abdomen Pelvis W Contrast  Result Date:  02/13/2017 CLINICAL DATA:  Two-day history of abdominal pain. History of extra ovarian primary peritoneal carcinoma. EXAM: CT ABDOMEN AND PELVIS WITH CONTRAST TECHNIQUE: Multidetector CT imaging of the abdomen and pelvis was performed using the standard protocol following bolus administration of intravenous contrast. CONTRAST:  100 cc Isovue 300 COMPARISON:  12/24/2016 FINDINGS: Lower chest:  Unremarkable. Hepatobiliary: No focal abnormality within the liver parenchyma. Gallbladder is distended with potential tiny layering gallstones (image 25 series 2). No intrahepatic or extrahepatic biliary dilation. Pancreas: No focal mass lesion. No dilatation of the main duct. No intraparenchymal cyst. No peripancreatic edema. Spleen: No splenomegaly. No focal mass lesion. Adrenals/Urinary Tract: No adrenal nodule or mass. Scarring noted upper pole right kidney. Tiny hypoattenuating lesion in the lower pole left kidney is stable. No hydronephrosis. No hydroureter. Bladder is decompressed. Stomach/Bowel: Stomach is distended. Transverse duodenum is nondistended. No gross small bowel obstruction. Terminal ileum is unremarkable. The appendix is not visualized, but there is no edema or inflammation in the region of the cecum. Colon is diffusely distended down to the sigmoid segment where there is relatively rapid luminal tapering due to fairly marked bowel wall thickening in the sigmoid segment. Multiple ill-defined areas of soft tissue attenuation are clustered within the thickened sigmoid colon compatible with the known peritoneal disease. Vascular/Lymphatic: There is abdominal aortic atherosclerosis without aneurysm. Reproductive: Neither the uterus nor the ovaries are well seen. Other: Prominent omental caking again noted. Index lesion measured previously at 13.2 x 3.4 cm now measures 14.9 x 3.1 cm. Fluid attenuation disease is seen around the liver tracking into the gallbladder fossa. A amorphous fluid is seen in the pelvis,  tracking in between bowel loops. Musculoskeletal: Bone windows reveal no worrisome lytic or sclerotic osseous lesions. IMPRESSION: 1. Diffuse colonic distention down to the level of the proximal to mid sigmoid colon were there is marked bowel wall thickening and luminal narrowing. Imaging features are compatible with distal colonic obstruction due to the wall thickening/ luminal narrowing. Abnormal soft tissue tracking in between the redundant sigmoid colon loops is consistent with peritoneal disease. The luminal narrowing of the sigmoid colon is well demonstrated on image 69 of series 2, adjacent to the left acetabulum. 2. The slight interval progression of malignant ascites. 3. Calcified omental cake is similar to mildly progressed in the interval. Electronically Signed   By: Misty Stanley M.D.   On: 02/13/2017  14:20    EKG:  Not done   Labs on Admission: I have personally reviewed the available labs and imaging studies at the time of the admission.  Pertinent labs:   Remarkably, her CMP is essentially normal.  Most surprising, her albumin is 3.9. Her CBC is also essentially normal.   Assessment/Plan Principal Problem:   Colon obstruction (HCC) Active Problems:   Peritoneal carcinomatosis (HCC)   Pressure injury of skin  Colon obstruction -Obstruction is very likely due to cancer burden -Will consult surgery in the AM -There are several possibilities:  1. Partial obstruction, will improve with supportive care  2. Partial to complete obstruction, will need NG tube drainage (not currently needed, although patient is NPO)  3. Complete obstruction, resectable for the obstruction only through a minimally invasive laparoscopy (unlikely)  4. Complete obstruction, will require colon resection and ostomy (patient and family are quite clear that this is not an option) -Based on one of the likely above scenarios, surgery at [facility] can evaluate the patient to determine if she needs surgery; if she is  a candidate for surgery; and then if the surgery can be performed here at [facility] vs. In [place] -For now, will admit to Med Surg -Pain control with morphine -Nausea control with Zofran -If she is not a surgical candidate, she may be a hospice candidate at this time   Peritoneal carcinomatosis -Unstageable and unresectable -She did receive 3 rounds of pre-operative chemotherapy -Debulking surgery was unsuccessful -Patient and family are fairly clear that she does not want further aggressive treatment including surgery; she is willing to undergo chemotherapy -Will request oncology consult -Given her improved appetite and nutrition status (although not weight), she may continue to be a candidate for chemotherapy -Will also request a palliative care consult  Pressure injury -Supportive care by nursing staff for now  DVT prophylaxis:  Lovenox  Code Status: DNR - confirmed with patient/family Family Communication: Son and daughter were present throughout hospitalization  Disposition Plan:  Home once clinically improved Consults called: Oncology; Surgery; Palliative care  Admission status: Admit - It is my clinical opinion that admission to INPATIENT is reasonable and necessary because this patient will require at least 2 midnights in the hospital to treat this condition based on the medical complexity of the problems presented.  Given the aforementioned information, the predictability of an adverse outcome is felt to be significant.    Karmen Bongo MD [facility]  If note is complete, please contact covering daytime or nighttime physician. [email or web address] Password [facility]  02/13/2017, 8:31 PM

## 2017-02-13 NOTE — ED Notes (Signed)
Pt cannot provide urine at this time.

## 2017-02-13 NOTE — ED Triage Notes (Signed)
Abdominal pain times 2 days.  Nausea.  VSS with EMS.

## 2017-02-13 NOTE — ED Notes (Signed)
Pt states she vomited most of her contrast, but is feeling better now.

## 2017-02-14 DIAGNOSIS — C801 Malignant (primary) neoplasm, unspecified: Secondary | ICD-10-CM

## 2017-02-14 DIAGNOSIS — C786 Secondary malignant neoplasm of retroperitoneum and peritoneum: Secondary | ICD-10-CM

## 2017-02-14 LAB — BASIC METABOLIC PANEL
Anion gap: 10 (ref 5–15)
BUN: 13 mg/dL (ref 6–20)
CALCIUM: 8.7 mg/dL — AB (ref 8.9–10.3)
CHLORIDE: 99 mmol/L — AB (ref 101–111)
CO2: 26 mmol/L (ref 22–32)
CREATININE: 0.59 mg/dL (ref 0.44–1.00)
GFR calc non Af Amer: >60 mL/min (ref >60)
Glucose, Bld: 91 mg/dL (ref 65–99)
Potassium: 3.4 mmol/L — ABNORMAL LOW (ref 3.5–5.1)
SODIUM: 135 mmol/L (ref 135–145)

## 2017-02-14 LAB — GLUCOSE, CAPILLARY
GLUCOSE-CAPILLARY: 75 mg/dL (ref 65–99)
GLUCOSE-CAPILLARY: 86 mg/dL (ref 65–99)
Glucose-Capillary: 78 mg/dL (ref 65–99)
Glucose-Capillary: 88 mg/dL (ref 65–99)

## 2017-02-14 LAB — CBC
HEMATOCRIT: 33.8 % — AB (ref 36.0–46.0)
Hemoglobin: 10.7 g/dL — ABNORMAL LOW (ref 12.0–15.0)
MCH: 28.8 pg (ref 26.0–34.0)
MCHC: 31.7 g/dL (ref 30.0–36.0)
MCV: 91.1 fL (ref 78.0–100.0)
Platelets: 284 10*3/uL (ref 150–400)
RBC: 3.71 MIL/uL — ABNORMAL LOW (ref 3.87–5.11)
RDW: 16.4 % — ABNORMAL HIGH (ref 11.5–15.5)
WBC: 7.6 10*3/uL (ref 4.0–10.5)

## 2017-02-14 MED ORDER — MORPHINE SULFATE (PF) 4 MG/ML IV SOLN
4.0000 mg | INTRAVENOUS | Status: DC | PRN
  Administered 2017-02-14 – 2017-02-15 (×2): 4 mg via INTRAVENOUS
  Filled 2017-02-14 (×2): qty 1

## 2017-02-14 MED ORDER — HYDROMORPHONE HCL 1 MG/ML IJ SOLN: 0.5000 mg | INTRAMUSCULAR | Status: DC | PRN

## 2017-02-14 NOTE — Care Management (Signed)
CM consulted for DC planning. Per notes, CSW is working on hospice facility in [place]. CM will sign off. Please consult again if needed.

## 2017-02-14 NOTE — Progress Notes (Signed)
PROGRESS NOTE    Sherry Hopkins  HGD:[id number] DOB: Jul 25, 1940 DOA: 02/13/2017 PCP: [name], MD   Brief Narrative:  Sherry Hopkins is a 76 y.o. female with medical history significant of peritoneal carcinomatosis; hypothyroidism; HTN; HLD; and DM presenting with abdominal pain.  She felt like there was "Lots of loud noise going on and it was hurting real bad".  Symptoms started about 2 nights ago.  The pain started spontaneously.  Pain is mostly RLQ but also in the LLQ.  It was "like something was just boiling, you know, loud".  The pain was intermittent but very frequent.  Slight nausea, some emesis in the ER today.  Last Bm was a couple of days ago and it was normal.  No fevers.  No chemo since the operation.  Eating good but unable to gain weight.  Up to 122 after the surgery, now 113 lb.    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  She completed 3 cycles of chemotherapy prior to surgical attempt on 10/23 ("20cm tumor plaque from omentum infiltrating across entire mid abdominal wall and investing the dome of the bladder. No apparent transverse colon obstruction.  Tumor was not resectable without resecting significant portion of bladder and anterior abdominal wall. Given modest response to first 3 cycles of chemotherapy, and persistent poor nutritional status, a radical debulking effort was not felt to be proportionate or curative.This representeda suboptimal debulking.").  She was last seen by Dr. Denman George after the unsuccessful surgery on 11/14 and was recommended to resume chemotherapy; there has been no further f/u appointment with oncology.  Dr. Denman George discussed the poor prognosis with the patient and her daughter at that time.    Patient was seen and evaluated by general.  A strictly palliative colostomy was offered.  Patient declined this option.  Discussion ensued between patient's daughter and  provider about hospice.  Patient's daughter states  patient did not want to know prognosis.  She would like hospice in [place] to be closer to her daughter.   Assessment & Plan:   Principal Problem:   Colon obstruction ([place]) Active Problems:   Peritoneal carcinomatosis ([place])   Pressure injury of skin   Colon obstruction -Obstruction is very likely due to cancer burden -Palliative diverting colostomy offered - Patient declined colostomy - Social work consulted for hospice placement -Patient would like to be in a [place] hospice to be closer to her daughter -Increase pain control to Dilaudid for severe pain and morphine for moderate pain    Peritoneal carcinomatosis -Unstageable and unresectable -She did receive 3 rounds of pre-operative chemotherapy -Debulking surgery was unsuccessful -Patient and family are fairly clear that she does not want further aggressive treatment including surgery; she is willing to undergo chemotherapy -Oncology consulted -Palliative care consulted - Patient stated in hospice  Pressure injury -Supportive care by nursing staff for now     DVT prophylaxis: Lovenox Code Status: DNR Family Communication: Daughter is bedside Disposition Plan: Social work looking into [facility] hospice facility   Consultants:   General surgery  Palliative care  Oncology  Procedures:   None  Antimicrobials:   None   Subjective: Discussed prognosis as well as options for patient with her daughter and patient.  Palliative colostomy was offered by general surgery but patient declined.  Patient's daughter requests that hospice in [place] be looked into as this is closer to her.  Patient is agreeable to hospice placement.  Objective: Vitals:  02/13/17 1700 02/13/17 1749 02/13/17 2142 02/14/17 0629  BP: (!) 143/83  (!) 149/89 (!) 145/80  Pulse: 92  90 85  Resp: 18  18 18   Temp: 98.5 F (36.9 C)  98.9 F (37.2 C) 98.9 F (37.2 C)   TempSrc: Oral  Oral Oral  SpO2: 100%  98% 100%  Weight: 54.9 kg (121 lb 0.5 oz) 51.5 kg (113 lb 8.6 oz)    Height: 5\' 3"  (1.6 m)       Intake/Output Summary (Last 24 hours) at 02/14/2017 1250 Last data filed at 02/14/2017 1200 Gross per 24 hour  Intake 2240 ml  Output 150 ml  Net 2090 ml   Filed Weights   02/13/17 1044 02/13/17 1700 02/13/17 1749  Weight: 51.3 kg (113 lb) 54.9 kg (121 lb 0.5 oz) 51.5 kg (113 lb 8.6 oz)    Examination:  General exam: Cachectic elderly female Respiratory system: Clear to auscultation. Respiratory effort normal. Cardiovascular system: S1 & S2 heard, RRR. No JVD, murmurs, rubs, gallops or clicks. No pedal edema. Gastrointestinal system: Nondistended, generalized tenderness Central nervous system: Alert and oriented. No focal neurological deficits. Extremities: Symmetric 5 x 5 power. Skin: No rashes, lesions or ulcers Psychiatry: Judgement and insight appear normal.  Appears sad    Data Reviewed: I have personally reviewed following labs and imaging studies  CBC: Recent Labs  Lab 02/13/17 1127 02/14/17 0428  WBC 6.7 7.6  HGB 11.0* 10.7*  HCT 35.5* 33.8*  MCV 91.7 91.1  PLT 280 706   Basic Metabolic Panel: Recent Labs  Lab 02/13/17 1127 02/14/17 0428  NA 139 135  K 4.2 3.4*  CL 102 99*  CO2 25 26  GLUCOSE 95 91  BUN 16 13  CREATININE 0.79 0.59  CALCIUM 9.4 8.7*   GFR: Estimated Creatinine Clearance: 49.4 mL/min (by C-G formula based on SCr of 0.59 mg/dL). Liver Function Tests: Recent Labs  Lab 02/13/17 1127  AST 19  ALT 11*  ALKPHOS 62  BILITOT 0.8  PROT 7.4  ALBUMIN 3.9   Recent Labs  Lab 02/13/17 1127  LIPASE 21   No results for input(s): AMMONIA in the last 168 hours. Coagulation Profile: No results for input(s): INR, PROTIME in the last 168 hours. Cardiac Enzymes: No results for input(s): CKTOTAL, CKMB, CKMBINDEX, TROPONINI in the last 168 hours. BNP (last 3 results) No results for input(s): PROBNP in  the last 8760 hours. HbA1C: No results for input(s): HGBA1C in the last 72 hours. CBG: Recent Labs  Lab 02/13/17 2050 02/14/17 0750 02/14/17 1107  GLUCAP 92 75 86   Lipid Profile: No results for input(s): CHOL, HDL, LDLCALC, TRIG, CHOLHDL, LDLDIRECT in the last 72 hours. Thyroid Function Tests: No results for input(s): TSH, T4TOTAL, FREET4, T3FREE, THYROIDAB in the last 72 hours. Anemia Panel: No results for input(s): VITAMINB12, FOLATE, FERRITIN, TIBC, IRON, RETICCTPCT in the last 72 hours. Sepsis Labs: No results for input(s): PROCALCITON, LATICACIDVEN in the last 168 hours.  No results found for this or any previous visit (from the past 240 hour(s)).       Radiology Studies: Ct Abdomen Pelvis W Contrast  Result Date: 02/13/2017 CLINICAL DATA:  Two-day history of abdominal pain. History of extra ovarian primary peritoneal carcinoma. EXAM: CT ABDOMEN AND PELVIS WITH CONTRAST TECHNIQUE: Multidetector CT imaging of the abdomen and pelvis was performed using the standard protocol following bolus administration of intravenous contrast. CONTRAST:  100 cc Isovue 300 COMPARISON:  12/24/2016 FINDINGS: Lower chest:  Unremarkable. Hepatobiliary: No focal  abnormality within the liver parenchyma. Gallbladder is distended with potential tiny layering gallstones (image 25 series 2). No intrahepatic or extrahepatic biliary dilation. Pancreas: No focal mass lesion. No dilatation of the main duct. No intraparenchymal cyst. No peripancreatic edema. Spleen: No splenomegaly. No focal mass lesion. Adrenals/Urinary Tract: No adrenal nodule or mass. Scarring noted upper pole right kidney. Tiny hypoattenuating lesion in the lower pole left kidney is stable. No hydronephrosis. No hydroureter. Bladder is decompressed. Stomach/Bowel: Stomach is distended. Transverse duodenum is nondistended. No gross small bowel obstruction. Terminal ileum is unremarkable. The appendix is not visualized, but there is no edema or  inflammation in the region of the cecum. Colon is diffusely distended down to the sigmoid segment where there is relatively rapid luminal tapering due to fairly marked bowel wall thickening in the sigmoid segment. Multiple ill-defined areas of soft tissue attenuation are clustered within the thickened sigmoid colon compatible with the known peritoneal disease. Vascular/Lymphatic: There is abdominal aortic atherosclerosis without aneurysm. Reproductive: Neither the uterus nor the ovaries are well seen. Other: Prominent omental caking again noted. Index lesion measured previously at 13.2 x 3.4 cm now measures 14.9 x 3.1 cm. Fluid attenuation disease is seen around the liver tracking into the gallbladder fossa. A amorphous fluid is seen in the pelvis, tracking in between bowel loops. Musculoskeletal: Bone windows reveal no worrisome lytic or sclerotic osseous lesions. IMPRESSION: 1. Diffuse colonic distention down to the level of the proximal to mid sigmoid colon were there is marked bowel wall thickening and luminal narrowing. Imaging features are compatible with distal colonic obstruction due to the wall thickening/ luminal narrowing. Abnormal soft tissue tracking in between the redundant sigmoid colon loops is consistent with peritoneal disease. The luminal narrowing of the sigmoid colon is well demonstrated on image 69 of series 2, adjacent to the left acetabulum. 2. The slight interval progression of malignant ascites. 3. Calcified omental cake is similar to mildly progressed in the interval. Electronically Signed   By: Misty Stanley M.D.   On: 02/13/2017 14:20        Scheduled Meds: . enoxaparin  40 mg Subcutaneous Q24H  . insulin aspart  0-9 Units Subcutaneous TID WC  . levothyroxine  25 mcg Oral QAC breakfast   Continuous Infusions: . lactated ringers 75 mL/hr at 02/14/17 0515     LOS: 1 day    Time spent: 35 minutes    Loretha Stapler, MD [facility] Pager [phone]  If  7PM-7AM, please contact night-coverage [email or web address] Password [email or web address] 02/14/2017, 12:50 PM

## 2017-02-14 NOTE — Consult Note (Signed)
[facility] Consult  Reason for Consult: Colonic obstruction  Referring Physician:  Dr. Adair Patter   Chief Complaint    Abdominal Pain      Sherry Hopkins is a 76 y.o. female.  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 She was seen by her Gyn Dr. Denman George 11/14 where they discussed the full prognosis and plan for return to chemotherapy until tumor was gone or until progression.  The patient presented to the hospital with abdominal pain, nausea, vomiting and poor po intake. She has not been taking in food for about 2 days and had not had a BM for about 4 days prior to her admission. She reports having a lot of gurgling noises from her abdomen.  She reports that she is not passing flatus.    Past Medical History:  Diagnosis Date  . Diabetes mellitus type 2, diet-controlled (HCC)    diet controlled  . GERD (gastroesophageal reflux disease)   . Hypercholesteremia   . Hypertension   . Hypothyroidism   . Iron deficiency anemia 10/12/2016  . Peritoneal carcinomatosis ([place]) 10/11/2016    Past Surgical History:  Procedure Laterality Date  . ABDOMINAL HYSTERECTOMY    . CATARACT EXTRACTION W/PHACO  08/23/2011   Procedure: CATARACT EXTRACTION PHACO AND INTRAOCULAR LENS PLACEMENT (IOC);  Surgeon: Tonny Branch, MD;  Location: [facility];  Service: Ophthalmology;  Laterality: Left;  CDE:15.60  . CATARACT EXTRACTION W/PHACO Right 08/16/2013   Procedure: CATARACT EXTRACTION PHACO AND INTRAOCULAR LENS PLACEMENT (IOC);  Surgeon: Tonny Branch, MD;  Location: [facility];  Service: Ophthalmology;  Laterality: Right;  CDE:8.18  . LAPAROTOMY N/A 01/04/2017   Procedure: EXPLORATORY LAPAROTOMY REPAIR OF CYSTOTOMY;  Surgeon: Everitt Amber, MD;  Location: [facility];  Service: Gynecology;  Laterality: N/A;  . PORTACATH PLACEMENT Left 11/01/2016   Procedure:  INSERTION PORT-A-CATH LEFT SUBCLAVIAN;  Surgeon: Aviva Signs, MD;  Location: [facility];  Service: General;  Laterality: Left;    History reviewed. No pertinent family history.  Social History   Tobacco Use  . Smoking status: Never Smoker  . Smokeless tobacco: Never Used  Substance Use Topics  . Alcohol use: No  . Drug use: No    Medications:  I have reviewed the patient's current medications. Prior to Admission:  Medications Prior to Admission  Medication Sig Dispense Refill Last Dose  . Cholecalciferol (VITAMIN D) 2000 units tablet Take 2,000 Units by mouth daily.   02/12/2017 at Unknown time  . enoxaparin (LOVENOX) 40 MG/0.4ML injection Inject 0.4 mLs (40 mg total) into the skin daily. 25 Syringe 0 02/12/2017 at Unknown time  . levothyroxine (SYNTHROID, LEVOTHROID) 25 MCG tablet Take 25 mcg by mouth daily before breakfast.   02/13/2017 at Unknown time  . lidocaine-prilocaine (EMLA) cream Apply a quarter size amount to affected area 1 hour prior to coming to chemotherapy. 30 g 2 unknown  . megestrol (MEGACE) 40 MG/ML suspension Take 2 mLs (80 mg total) by mouth 2 (two) times daily. 480 mL 0 Past Week at Unknown time  . metFORMIN (GLUCOPHAGE-XR) 500 MG 24 hr tablet Take 500 mg by mouth every evening.    02/12/2017 at Unknown time  . naproxen sodium (ANAPROX) 220 MG tablet Take 220 mg by mouth daily as needed (pain).    unknown  . omeprazole (PRILOSEC) 20 MG capsule Take 1 capsule (20 mg total)  by mouth daily. 30 capsule 3 unknown  . ondansetron (ZOFRAN ODT) 4 MG disintegrating tablet 71m ODT q4 hours prn nausea/vomit 12 tablet 0 02/12/2017 at Unknown time  . polyethylene glycol powder (GLYCOLAX/MIRALAX) powder Take 1 capful daily. (Patient taking differently: Take 17 g by mouth daily as needed for mild constipation. ) 255 g 2 unknown  . Potassium Chloride ER 20 MEQ TBCR Take 20 mEq by mouth 2 (two) times daily. 60 tablet 3 Past Week at Unknown time  . pravastatin (PRAVACHOL) 80 MG tablet  Take 80 mg by mouth daily.    Past Week at Unknown time  . prochlorperazine (COMPAZINE) 10 MG tablet Take 1 tablet (10 mg total) by mouth every 6 (six) hours as needed for nausea or vomiting. 30 tablet 2 unknown  . senna (SENOKOT) 8.6 MG TABS tablet Take 1 tablet by mouth daily as needed for mild constipation.   unknown  . traMADol (ULTRAM) 50 MG tablet Take 1 tablet (50 mg total) by mouth every 6 (six) hours as needed (pain). 30 tablet 0 Past Week at Unknown time   Scheduled: . enoxaparin  40 mg Subcutaneous Q24H  . insulin aspart  0-9 Units Subcutaneous TID WC  . levothyroxine  25 mcg Oral QAC breakfast   Continuous: . lactated ringers 75 mL/hr at 02/14/17 0515   PHXT:[id number]**OR** acetaminophen, HYDROmorphone (DILAUDID) injection, morphine injection, ondansetron **OR** ondansetron (ZOFRAN) IV  Allergies: No Known Allergies  ROS:  A comprehensive review of systems was negative except for: Constitutional: positive for anorexia and weight loss Gastrointestinal: positive for abdominal pain, nausea, vomiting and obstipation  Known peritoneal carcinomatosis   Blood pressure (!) 145/80, pulse 85, temperature 98.9 F (37.2 C), temperature source Oral, resp. rate 18, height _0  (1.6 m), weight 113 lb 8.6 oz (51.5 kg), SpO2 100 %. Physical Exam  Constitutional: Vital signs are normal. She appears malnourished. She appears cachectic. No distress.  HENT:  Head: Normocephalic.  Temporal wasting  Cardiovascular: Normal rate and regular rhythm.  Pulmonary/Chest: Effort normal.  Abdominal: Soft. She exhibits distension. There is tenderness. There is no rebound and no guarding.  Musculoskeletal: Normal range of motion.  Neurological: She is alert.  Skin: Skin is warm and dry.  Psychiatric: Memory, affect and judgment normal. She exhibits a depressed mood.  Vitals reviewed.   Results: Results for orders placed or performed during the hospital encounter of 02/13/17 (from the past 48  hour(s))  Comprehensive metabolic panel     Status: Abnormal   Collection Time: 02/13/17 11:27 AM  Result Value Ref Range   Sodium 139 135 - 145 mmol/L   Potassium 4.2 3.5 - 5.1 mmol/L   Chloride 102 101 - 111 mmol/L   CO2 25 22 - 32 mmol/L   Glucose, Bld 95 65 - 99 mg/dL   BUN 16 6 - 20 mg/dL   Creatinine, Ser 0.79 0.44 - 1.00 mg/dL   Calcium 9.4 8.9 - 10.3 mg/dL   Total Protein 7.4 6.5 - 8.1 g/dL   Albumin 3.9 3.5 - 5.0 g/dL   AST 19 15 - 41 U/L   ALT 11 (L) 14 - 54 U/L   Alkaline Phosphatase 62 38 - 126 U/L   Total Bilirubin 0.8 0.3 - 1.2 mg/dL   GFR calc non Af Amer >60 >60 mL/min   GFR calc Af Amer >60 >60 mL/min    Comment: (NOTE) The eGFR has been calculated using the CKD EPI equation. This calculation has not been validated in all  clinical situations. eGFR's persistently <60 mL/min signify possible Chronic Kidney Disease.    Anion gap 12 5 - 15  CBC     Status: Abnormal   Collection Time: 02/13/17 11:27 AM  Result Value Ref Range   WBC 6.7 4.0 - 10.5 K/uL   RBC 3.87 3.87 - 5.11 MIL/uL   Hemoglobin 11.0 (L) 12.0 - 15.0 g/dL   HCT 35.5 (L) 36.0 - 46.0 %   MCV 91.7 78.0 - 100.0 fL   MCH 28.4 26.0 - 34.0 pg   MCHC 31.0 30.0 - 36.0 g/dL   RDW 16.6 (H) 11.5 - 15.5 %   Platelets 280 150 - 400 K/uL  Lipase, blood     Status: None   Collection Time: 02/13/17 11:27 AM  Result Value Ref Range   Lipase 21 11 - 51 U/L  Glucose, capillary     Status: None   Collection Time: 02/13/17  8:50 PM  Result Value Ref Range   Glucose-Capillary 92 65 - 99 mg/dL   Comment 1 Notify RN    Comment 2 Document in Chart   Urinalysis, Routine w reflex microscopic     Status: Abnormal   Collection Time: 02/13/17 10:30 PM  Result Value Ref Range   Color, Urine YELLOW YELLOW   APPearance HAZY (A) CLEAR   Specific Gravity, Urine >1.046 (H) 1.005 - 1.030   pH 5.0 5.0 - 8.0   Glucose, UA NEGATIVE NEGATIVE mg/dL   Hgb urine dipstick NEGATIVE NEGATIVE   Bilirubin Urine NEGATIVE NEGATIVE    Ketones, ur 20 (A) NEGATIVE mg/dL   Protein, ur NEGATIVE NEGATIVE mg/dL   Nitrite NEGATIVE NEGATIVE   Leukocytes, UA MODERATE (A) NEGATIVE   RBC / HPF 6-30 0 - 5 RBC/hpf   WBC, UA 6-30 0 - 5 WBC/hpf   Bacteria, UA NONE SEEN NONE SEEN   Squamous Epithelial / LPF 6-30 (A) NONE SEEN   Mucus PRESENT   Basic metabolic panel     Status: Abnormal   Collection Time: 02/14/17  4:28 AM  Result Value Ref Range   Sodium 135 135 - 145 mmol/L   Potassium 3.4 (L) 3.5 - 5.1 mmol/L    Comment: DELTA CHECK NOTED   Chloride 99 (L) 101 - 111 mmol/L   CO2 26 22 - 32 mmol/L   Glucose, Bld 91 65 - 99 mg/dL   BUN 13 6 - 20 mg/dL   Creatinine, Ser 0.59 0.44 - 1.00 mg/dL   Calcium 8.7 (L) 8.9 - 10.3 mg/dL   GFR calc non Af Amer >60 >60 mL/min   GFR calc Af Amer >60 >60 mL/min    Comment: (NOTE) The eGFR has been calculated using the CKD EPI equation. This calculation has not been validated in all clinical situations. eGFR's persistently <60 mL/min signify possible Chronic Kidney Disease.    Anion gap 10 5 - 15  CBC     Status: Abnormal   Collection Time: 02/14/17  4:28 AM  Result Value Ref Range   WBC 7.6 4.0 - 10.5 K/uL   RBC 3.71 (L) 3.87 - 5.11 MIL/uL   Hemoglobin 10.7 (L) 12.0 - 15.0 g/dL   HCT 33.8 (L) 36.0 - 46.0 %   MCV 91.1 78.0 - 100.0 fL   MCH 28.8 26.0 - 34.0 pg   MCHC 31.7 30.0 - 36.0 g/dL   RDW 16.4 (H) 11.5 - 15.5 %   Platelets 284 150 - 400 K/uL  Glucose, capillary     Status: None  Collection Time: 02/14/17  7:50 AM  Result Value Ref Range   Glucose-Capillary 75 65 - 99 mg/dL  Glucose, capillary     Status: None   Collection Time: 02/14/17 11:07 AM  Result Value Ref Range   Glucose-Capillary 86 65 - 99 mg/dL   Personally reviewed Ct scan- colonic obstruction with sigmoid colon narrowed by peritoneal disease, dilated colon with gas; anterior wall disease and omental caking  Ct Abdomen Pelvis W Contrast  Result Date: 02/13/2017 CLINICAL DATA:  Two-day history of abdominal  pain. History of extra ovarian primary peritoneal carcinoma. EXAM: CT ABDOMEN AND PELVIS WITH CONTRAST TECHNIQUE: Multidetector CT imaging of the abdomen and pelvis was performed using the standard protocol following bolus administration of intravenous contrast. CONTRAST:  100 cc Isovue 300 COMPARISON:  12/24/2016 FINDINGS: Lower chest:  Unremarkable. Hepatobiliary: No focal abnormality within the liver parenchyma. Gallbladder is distended with potential tiny layering gallstones (image 25 series 2). No intrahepatic or extrahepatic biliary dilation. Pancreas: No focal mass lesion. No dilatation of the main duct. No intraparenchymal cyst. No peripancreatic edema. Spleen: No splenomegaly. No focal mass lesion. Adrenals/Urinary Tract: No adrenal nodule or mass. Scarring noted upper pole right kidney. Tiny hypoattenuating lesion in the lower pole left kidney is stable. No hydronephrosis. No hydroureter. Bladder is decompressed. Stomach/Bowel: Stomach is distended. Transverse duodenum is nondistended. No gross small bowel obstruction. Terminal ileum is unremarkable. The appendix is not visualized, but there is no edema or inflammation in the region of the cecum. Colon is diffusely distended down to the sigmoid segment where there is relatively rapid luminal tapering due to fairly marked bowel wall thickening in the sigmoid segment. Multiple ill-defined areas of soft tissue attenuation are clustered within the thickened sigmoid colon compatible with the known peritoneal disease. Vascular/Lymphatic: There is abdominal aortic atherosclerosis without aneurysm. Reproductive: Neither the uterus nor the ovaries are well seen. Other: Prominent omental caking again noted. Index lesion measured previously at 13.2 x 3.4 cm now measures 14.9 x 3.1 cm. Fluid attenuation disease is seen around the liver tracking into the gallbladder fossa. A amorphous fluid is seen in the pelvis, tracking in between bowel loops. Musculoskeletal: Bone  windows reveal no worrisome lytic or sclerotic osseous lesions. IMPRESSION: 1. Diffuse colonic distention down to the level of the proximal to mid sigmoid colon were there is marked bowel wall thickening and luminal narrowing. Imaging features are compatible with distal colonic obstruction due to the wall thickening/ luminal narrowing. Abnormal soft tissue tracking in between the redundant sigmoid colon loops is consistent with peritoneal disease. The luminal narrowing of the sigmoid colon is well demonstrated on image 69 of series 2, adjacent to the left acetabulum. 2. The slight interval progression of malignant ascites. 3. Calcified omental cake is similar to mildly progressed in the interval. Electronically Signed   By: Misty Stanley M.D.   On: 02/13/2017 14:20    Assessment & Plan:  Karely Hurtado is a 76 y.o. female with a LBO from a colonic obstruction secondary to her peritoneal carcinomatous. She has not had debulking and the plan is to resume chemotherapy, which was hard on her at times. She was alone during this conversation but tried to get her daughter on the phone.  -Options of Palliative Transverse loop colostomy versus palliation without surgery -Biggest difference is ability to eat or drink in the time she has left, potential to make it to any chemotherapy, but unlikely given her deconditioned state that she will be able to undergo the chemotherapy  -  Patient unsure if she wants anymore surgery -Discussed that the colostomy could help with some of the pain, but that you would have the incisional/ post operative pain  -Patient going to discuss with her family and Palliative Care consulted   Will follow.   Virl Cagey 02/14/2017, 4:06 PM

## 2017-02-14 NOTE — Clinical Social Work Note (Signed)
Patient Information   Patient Name Sherry Hopkins, Sherry Hopkins ([id number]) Sex Female DOB [birth year] SSN [id number]  Room Bed  A318 A318-01  Patient Demographics   Address [address] [place] [address] Contact Numbers [phone]  Patient Ethnicity & Race   Ethnic Group Patient Race  Not Hispanic or Latino Black or African American  Emergency Contact(s)   Name Relation Home Work Mobile  [place] Daughter [phone]  [phone]  Khamryn, Calderone [phone]  [phone]  Documents on File    Status Date Received Description  Documents for the Patient  [place] Not Received    [place] E-Signature HIPAA Notice of Privacy Received 01/12/58   Driver's License Received 45/85/92 NCDL EXP 9.16.2023  Insurance Card Received 08/18/11   Advance Directives/Living Will/HCPOA/POA Not Received    Insurance Card  08/18/11   Other Photo ID Not Received    Insurance Card Received 10/20/16 [place] E-Signature HIPAA Notice of Privacy [place]     Release of Information Received 10/29/16 [facility]/RSA  Advanced Beneficiary Notice (ABN) Not Received    E-Signature AOB [place] Not Received    [facility] Release of Information Output  02/14/17 Medication Administration Record, Lab Reports, Progress Note, Notes Individual  Documents for the Encounter  AOB (Assignment of Insurance Benefits) Not Received    E-signature AOB Signed 02/13/17   MEDICARE RIGHTS Not Received    E-signature Medicare Rights Signed 02/13/17   ED Patient Billing Extract   ED PB Summary  Admission Information   Attending Provider Admitting Provider Admission Type Admission Date/Time  Eber Jones, MD Karmen Bongo, MD Emergency 02/13/17 1045  Discharge Date Hospital Service Auth/Cert Status Service Area   Internal Medicine Incomplete [place]  Unit Room/Bed Admission Status   [facility]-DEPT 300 A318/A318-01 Admission (Confirmed)    Admission   Complaint  ----  Hospital Account   Name Acct ID Class Status Primary Coverage  Obarr, Shawna [id number] Inpatient Open UNITED HEALTHCARE MEDICARE - UHC MEDICARE      Guarantor Account (for Hospital Account [id number])   Name Relation to [place]? Acct Type  Kerrin Champagne Self CHSA Yes Personal/Family  Address Phone    [address] [place], [place] [address] [phone])        Coverage Information (for Hospital Account [id number])   F/O Payor/Plan Precert #  [facility] #  [place], [place] [id number]  Address Phone  [address] [place] [address] [phone]

## 2017-02-14 NOTE — Clinical Social Work Note (Signed)
Per attending's request, referral was made to [facility].    Mickaela Starlin D, LCSW  

## 2017-02-15 ENCOUNTER — Encounter (HOSPITAL_COMMUNITY): Payer: Self-pay | Admitting: Primary Care

## 2017-02-15 DIAGNOSIS — E43 Unspecified severe protein-calorie malnutrition: Secondary | ICD-10-CM

## 2017-02-15 DIAGNOSIS — Z7189 Other specified counseling: Secondary | ICD-10-CM

## 2017-02-15 DIAGNOSIS — C482 Malignant neoplasm of peritoneum, unspecified: Secondary | ICD-10-CM

## 2017-02-15 DIAGNOSIS — Z515 Encounter for palliative care: Secondary | ICD-10-CM

## 2017-02-15 LAB — GLUCOSE, CAPILLARY
GLUCOSE-CAPILLARY: 88 mg/dL (ref 65–99)
Glucose-Capillary: 97 mg/dL (ref 65–99)
Glucose-Capillary: 75 mg/dL (ref 65–99)

## 2017-02-15 MED ORDER — MORPHINE SULFATE (PF) 4 MG/ML IV SOLN
4.0000 mg | INTRAVENOUS | Status: DC | PRN
  Administered 2017-02-15: 4 mg via INTRAVENOUS
  Filled 2017-02-15: qty 1

## 2017-02-15 MED ORDER — MORPHINE SULFATE (PF) 4 MG/ML IV SOLN: 4.0000 mg | INTRAVENOUS | 0 refills | Status: AC | PRN

## 2017-02-15 NOTE — Clinical Social Work Note (Signed)
After a great deal of discussion, processing and supportive counseling, patient's daughter, Helene Kelp, and son in law made the decision for patient to go to [facility].  Discharge clinicals sent to [facility].    LCSW signing off.      Shawna Wearing, Clydene Pugh, LCSW

## 2017-02-15 NOTE — Discharge Summary (Signed)
Physician Discharge Summary  Sherry Hopkins YDX:[id number] DOB: January 19, 1941 DOA: 02/13/2017  PCP: [name], MD  Admit date: 02/13/2017 Discharge date: 02/15/2017  Admitted From: Home Disposition:  [facility] residential [facility]  Recommendations for Outpatient Follow-up:  1. Follow-up residential hospice   Equipment/Devices: Oxygen  Discharge Condition:Guarded CODE STATUS: DO NOT RESUSCITATE Diet recommendation: Nothing by mouth, comfort feeds if tolerated    Discharge Diagnoses:  Principal Problem:   Colon obstruction ([place])   Active Problems:   Iron deficiency anemia   Severe malnutrition ([place])   Primary peritoneal carcinomatosis ([place])   Pressure injury of skin   Brief narrative/history of present illness y.o.femalewith medical history significant ofperitoneal carcinomatosis; hypothyroidism; HTN; HLD; and DM presenting with abdominal pain. She felt like there was "Lots of loud noise going on and it was hurting real bad". Symptoms started about 2 nights ago. The pain started spontaneously. Pain is mostly RLQ but also in the LLQ. It was "like something was just boiling, you know, loud". The pain was intermittent but very frequent. Slight nausea, some emesis in the ER today. Last Bm was a couple of days ago and it was normal. No fevers. No chemo since the operation. Eating good but unable to gain weight. Up to 122 after the surgery, now 113 lb.   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 She completed 3 cycles of chemotherapy prior to surgical attempt on 10/23 ("20cm tumor plaque from omentum infiltrating across entire mid abdominal wall and investing the dome of the bladder. No apparent transverse colon obstruction. Tumor was not resectable without resecting significant portion of bladder and anterior abdominal wall. Given modest response to first 3 cycles of chemotherapy,  and persistent poor nutritional status, a radical debulking effort was not felt to be proportionate or curative.This representeda suboptimal debulking."). She was last seen by Dr. Denman George after the unsuccessful surgery on 11/14 and was recommended to resume chemotherapy; there has been no further f/u appointment with oncology. Dr. Denman George discussed the poor prognosis with the patient and her daughter at that time.   Patient was seen and evaluated by surgery.  A strictly palliative colostomy was offered.  Patient declined this option.  Discussion ensued between patient's daughter and provider about hospice.  Patient's daughter states  patient did not want to know prognosis.  She would like hospice in [place] to be closer to her .  Hospital course  Colon obstruction -Obstruction is very likely due to cancer burden -Palliative diverting colostomy offered which patient declined. -After discussing with patient and family by hospice and palliative care was agreed on making patient comfort care and discharged to a gentle hospice. Pain control with as needed Dilaudid and morphine.   Peritoneal carcinomatosis -Unstageable and unresectable -She did receive 3 rounds of pre-operative chemotherapy -Debulking surgery was unsuccessful -Patient and family are fairly clear that she does not want further aggressive treatment including surgery. -Appreciate oncology and palliative care evaluation. Now transition to hospice.  Pressure injury -Supportive care by nursing   Diabetes mellitus Discontinue metformin  Hypothyroidism Discontinued Synthroid   Family Communication:  discussed at length with daughter Disposition Plan:  residential hospice   Consultants:   General surgery  Palliative care  Oncology  Procedures:   None  Antimicrobials:   None       Discharge Instructions   Allergies as of 02/15/2017   No Known Allergies     Medication List    STOP taking  these  medications   enoxaparin 40 MG/0.4ML injection Commonly known as:  LOVENOX   levothyroxine 25 MCG tablet Commonly known as:  SYNTHROID, LEVOTHROID   lidocaine-prilocaine cream Commonly known as:  EMLA   megestrol 40 MG/ML suspension Commonly known as:  MEGACE   metFORMIN 500 MG 24 hr tablet Commonly known as:  GLUCOPHAGE-XR   naproxen sodium 220 MG tablet Commonly known as:  ALEVE   omeprazole 20 MG capsule Commonly known as:  PRILOSEC   ondansetron 4 MG disintegrating tablet Commonly known as:  ZOFRAN ODT   polyethylene glycol powder powder Commonly known as:  GLYCOLAX/MIRALAX   Potassium Chloride ER 20 MEQ Tbcr   pravastatin 80 MG tablet Commonly known as:  PRAVACHOL   prochlorperazine 10 MG tablet Commonly known as:  COMPAZINE   senna 8.6 MG Tabs tablet Commonly known as:  SENOKOT   traMADol 50 MG tablet Commonly known as:  ULTRAM   Vitamin D 2000 units tablet     TAKE these medications   morphine 4 MG/ML injection Inject 1 mL (4 mg total) into the vein every 2 (two) hours as needed for moderate pain.      Follow-up Information    Residential hospice Follow up.          No Known Allergies    Procedures/Studies: Ct Abdomen Pelvis W Contrast  Result Date: 02/13/2017 CLINICAL DATA:  Two-day history of abdominal pain. History of extra ovarian primary peritoneal carcinoma. EXAM: CT ABDOMEN AND PELVIS WITH CONTRAST TECHNIQUE: Multidetector CT imaging of the abdomen and pelvis was performed using the standard protocol following bolus administration of intravenous contrast. CONTRAST:  100 cc Isovue 300 COMPARISON:  12/24/2016 FINDINGS: Lower chest:  Unremarkable. Hepatobiliary: No focal abnormality within the liver parenchyma. Gallbladder is distended with potential tiny layering gallstones (image 25 series 2). No intrahepatic or extrahepatic biliary dilation. Pancreas: No focal mass lesion. No dilatation of the main duct. No intraparenchymal cyst. No  peripancreatic edema. Spleen: No splenomegaly. No focal mass lesion. Adrenals/Urinary Tract: No adrenal nodule or mass. Scarring noted upper pole right kidney. Tiny hypoattenuating lesion in the lower pole left kidney is stable. No hydronephrosis. No hydroureter. Bladder is decompressed. Stomach/Bowel: Stomach is distended. Transverse duodenum is nondistended. No gross small bowel obstruction. Terminal ileum is unremarkable. The appendix is not visualized, but there is no edema or inflammation in the region of the cecum. Colon is diffusely distended down to the sigmoid segment where there is relatively rapid luminal tapering due to fairly marked bowel wall thickening in the sigmoid segment. Multiple ill-defined areas of soft tissue attenuation are clustered within the thickened sigmoid colon compatible with the known peritoneal disease. Vascular/Lymphatic: There is abdominal aortic atherosclerosis without aneurysm. Reproductive: Neither the uterus nor the ovaries are well seen. Other: Prominent omental caking again noted. Index lesion measured previously at 13.2 x 3.4 cm now measures 14.9 x 3.1 cm. Fluid attenuation disease is seen around the liver tracking into the gallbladder fossa. A amorphous fluid is seen in the pelvis, tracking in between bowel loops. Musculoskeletal: Bone windows reveal no worrisome lytic or sclerotic osseous lesions. IMPRESSION: 1. Diffuse colonic distention down to the level of the proximal to mid sigmoid colon were there is marked bowel wall thickening and luminal narrowing. Imaging features are compatible with distal colonic obstruction due to the wall thickening/ luminal narrowing. Abnormal soft tissue tracking in between the redundant sigmoid colon loops is consistent with peritoneal disease. The luminal narrowing of the sigmoid colon is well demonstrated on image  69 of series 2, adjacent to the left acetabulum. 2. The slight interval progression of malignant ascites. 3. Calcified  omental cake is similar to mildly progressed in the interval. Electronically Signed   By: Misty Stanley M.D.   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 Status post resection and bladder fixation. Evaluate for bladder leak. EXAM: CYSTOGRAM TECHNIQUE: After catheterization of the urinary bladder following sterile technique the bladder was filled with 250 mL Cysto-Hypaque 30% by drip infusion. Serial spot images were obtained during bladder filling and post draining. FLUOROSCOPY TIME:  Fluoroscopy Time:  1 minutes and 0 seconds Radiation Exposure Index (if provided by the fluoroscopic device): 15.9 mGy Number of Acquired Spot Images: 0 COMPARISON:  None. FINDINGS: Normal appearance of the bladder except for some areas of trabeculation. No bladder leak was demonstrated. IMPRESSION: No evidence of bladder leak/extravasation. Electronically Signed   By: Marijo Sanes M.D.   On: 01/18/2017 14:46    (Echo, Carotid, EGD, Colonoscopy, ERCP)    Subjective:   Discharge Exam: Vitals:   02/14/17 2149 02/15/17 0512  BP: (!) 142/85 (!) 149/84  Pulse: 87 90  Resp: 19 19  Temp: 98.3 F (36.8 C) 98.6 F (37 C)  SpO2: 100% 100%   Vitals:   02/14/17 1400 02/14/17 2002 02/14/17 2149 02/15/17 0512  BP: 133/75  (!) 142/85 (!) 149/84  Pulse: 87  87 90  Resp: 19  19 19   Temp: 98.4 F (36.9 C)  98.3 F (36.8 C) 98.6 F (37 C)  TempSrc: Oral  Oral Oral  SpO2: 99% 97% 100% 100%  Weight:      Height:        General: Elderly female appears fatigued, not in distress HEENT: Alert present, dry mucosa, supple neck Chest: Clear bilaterally  CVS: Normal S1 and S2, no murmurs rub or gallop GI: Soft, mild distention, tender to pressure, absent bowel sounds Musculoskeletal: Warm, no edema    The results of significant diagnostics from this hospitalization (including imaging, microbiology, ancillary and  laboratory) are listed below for reference.     Microbiology: No results found for this or any previous visit (from the past 240 hour(s)).   Labs: BNP (last 3 results) No results for input(s): BNP in the last 8760 hours. Basic Metabolic Panel: Recent Labs  Lab 02/13/17 1127 02/14/17 0428  NA 139 135  K 4.2 3.4*  CL 102 99*  CO2 25 26  GLUCOSE 95 91  BUN 16 13  CREATININE 0.79 0.59  CALCIUM 9.4 8.7*   Liver Function Tests: Recent Labs  Lab 02/13/17 1127  AST 19  ALT 11*  ALKPHOS 62  BILITOT 0.8  PROT 7.4  ALBUMIN 3.9   Recent Labs  Lab 02/13/17 1127  LIPASE 21   No results for input(s): AMMONIA in the last 168 hours. CBC: Recent Labs  Lab 02/13/17 1127 02/14/17 0428  WBC 6.7 7.6  HGB 11.0* 10.7*  HCT 35.5* 33.8*  MCV 91.7 91.1  PLT 280 284   Cardiac Enzymes: No results for input(s): CKTOTAL, CKMB, CKMBINDEX, TROPONINI in the last 168 hours. BNP: Invalid input(s): POCBNP CBG: Recent Labs  Lab 02/14/17 1107 02/14/17 1605 02/14/17 2108 02/15/17 0728 02/15/17 1053  GLUCAP 86 78 88 97 75   D-Dimer No results for input(s): DDIMER in the last 72 hours. Hgb A1c No results for input(s): HGBA1C in the last 72 hours. Lipid  Profile No results for input(s): CHOL, HDL, LDLCALC, TRIG, CHOLHDL, LDLDIRECT in the last 72 hours. Thyroid function studies No results for input(s): TSH, T4TOTAL, T3FREE, THYROIDAB in the last 72 hours.  Invalid input(s): FREET3 Anemia work up No results for input(s): VITAMINB12, FOLATE, FERRITIN, TIBC, IRON, RETICCTPCT in the last 72 hours. Urinalysis    Component Value Date/Time   COLORURINE YELLOW 02/13/2017 2230   APPEARANCEUR HAZY (A) 02/13/2017 2230   LABSPEC >1.046 (H) 02/13/2017 2230   PHURINE 5.0 02/13/2017 2230   GLUCOSEU NEGATIVE 02/13/2017 2230   HGBUR NEGATIVE 02/13/2017 2230   BILIRUBINUR NEGATIVE 02/13/2017 2230   KETONESUR 20 (A) 02/13/2017 2230   PROTEINUR NEGATIVE 02/13/2017 2230   NITRITE NEGATIVE  02/13/2017 2230   LEUKOCYTESUR MODERATE (A) 02/13/2017 2230   Sepsis Labs Invalid input(s): PROCALCITONIN,  WBC,  LACTICIDVEN Microbiology No results found for this or any previous visit (from the past 240 hour(s)).   Time coordinating discharge: Over 30 minutes  SIGNED:   Louellen Molder, MD  [facility] 02/15/2017, 12:13 PM Pager   If 7PM-7AM, please contact night-coverage [email or web address] Password [email or web address]

## 2017-02-15 NOTE — Progress Notes (Signed)
Patient discharging to [facility] in [place]. Patient aware, daughter at bedside and aware of discharge plan as well. Called report to receiving nurse at [facility]. Pt in stable condition awaiting EMS transport for discharge. Donavan Foil, RN

## 2017-02-15 NOTE — Progress Notes (Signed)
Patient discharged to [facility] in [place]. Discharge packet sent with patient via EMS transport. DNR and prescription in packet. Pt left floor in stable condition via stretcher and EMS transport. [place] and notified [place], receiving nurse of last dose of morphine and that patient is on her way. Pt reported to EMS transport her phone was missing, pt had left unit when nursing arrived to address. Notified receiving nurse to call us back if patient reports phone still missing. Donavan Foil, RN

## 2017-02-15 NOTE — Progress Notes (Signed)
Patient had CBG 75 prior to lunch, asymptomatic. Currently NPO except ice chips and pleasure foods per Quinn Axe, NP with palliative consult. Text-paged MD to notify of lower CBG. Discussed with Quinn Axe, NP. Stated okay for her to have sips of juice or bites of ice cream as tolerated for comfort. Discussed with patient and instructed her to notify nursing staff of any nausea or abdominal discomfort. Verbalized understanding. Pt had few bites of vanilla ice cream. Rechecked CBG 88. Donavan Foil, RN

## 2017-02-15 NOTE — Consult Note (Signed)
Consultation Note Date: 02/15/2017   Patient Name: Nekita Pita  DOB: [birth year]  MRN: [id number]  Age / Sex: 76 y.o., female  PCP: [name], MD Referring Physician: Louellen Molder, MD  Reason for Consultation: Establishing goals of care, Inpatient hospice referral and Psychosocial/spiritual support  HPI/Patient Profile: 76 y.o. female  with past medical history of hypertension, high cholesterol, diet-controlled diabetes, hypothyroidism, recent diagnosis of peritoneal carcinomatosis with unsuccessful debulking surgery 11/14 admitted on 02/13/2017 with colon obstruction, peritoneal carcinomatosis.   Clinical Assessment and Goals of Care: Mrs. Whitehead is resting quietly in bed.  She will speak to me, but only will rarely and briefly make eye contact.  She is calm and cooperative.  There is no family at bedside at this time.  We talked about finding a balance for her.  I share that she can have small sips and taste of food or liquid, but not too much as this will make her sick.  She asks for diet ginger ale. Later in the morning daughter Laurel Dimmer and her husband Joylene John arrive.  Family meeting with social worker [place].  We talked about the severity of her mother's illness, what is normal and expected at end of life.  Unfortunately Helene Kelp felt that she understood her mother's prognosis to be 3 months.  With permission we talked about prognosis, end of life.  Dr. Cydney Ok arrives, he also talks about prognosis, advancement of disease, hospice.  Patient and daughter elect comfort and dignity at end of life, [facility].  Healthcare power of attorney NEXT OF KIN    SUMMARY OF RECOMMENDATIONS   Requesting comfort and dignity at end of life at [facility].  Code Status/Advance Care Planning:  DNR  Symptom Management:   Per hospice protocol  Palliative  Prophylaxis:   Frequent Pain Assessment and Turn Reposition  Additional Recommendations (Limitations, Scope, Preferences):  Full Comfort Care  Psycho-social/Spiritual:   Desire for further Chaplaincy support:no  Additional Recommendations: Caregiving  Support/Resources and Education on Hospice  Prognosis:   < 4 weeks  Discharge Planning: [facility] for comfort and dignity at end of life.      Primary Diagnoses: Present on Admission: . Pressure injury of skin . Colon obstruction ([place]) . Iron deficiency anemia . Primary peritoneal carcinomatosis ([place]) . Severe malnutrition ([place])   I have reviewed the medical record, interviewed the patient and family, and examined the patient. The following aspects are pertinent.  Past Medical History:  Diagnosis Date  . Diabetes mellitus type 2, diet-controlled ([facility])    diet controlled  . GERD (gastroesophageal reflux disease)   . Hypercholesteremia   . Hypertension   . Hypothyroidism   . Iron deficiency anemia 10/12/2016  . Peritoneal carcinomatosis ([place]) 10/11/2016   Social History   Socioeconomic History  . Marital status: Divorced    Spouse name: None  . Number of children: None  . Years of education: None  . Highest education level: None  Social Needs  . Financial resource strain: None  .  Food insecurity - worry: None  . Food insecurity - inability: None  . Transportation needs - medical: None  . Transportation needs - non-medical: None  Occupational History  . Occupation: retired  Tobacco Use  . Smoking status: Never Smoker  . Smokeless tobacco: Never Used  Substance and Sexual Activity  . Alcohol use: No  . Drug use: No  . Sexual activity: Not Currently    Birth control/protection: Surgical  Other Topics Concern  . None  Social History Narrative  . None   History reviewed. No pertinent family history. Scheduled Meds: . enoxaparin  40 mg Subcutaneous Q24H  . insulin aspart  0-9 Units  Subcutaneous TID WC  . levothyroxine  25 mcg Oral QAC breakfast   Continuous Infusions: . lactated ringers 75 mL/hr at 02/15/17 0757   PRN Meds:.acetaminophen **OR** acetaminophen, HYDROmorphone (DILAUDID) injection, morphine injection, ondansetron **OR** ondansetron (ZOFRAN) IV Medications Prior to Admission:  Prior to Admission medications   Medication Sig Start Date End Date Taking? Authorizing Provider  Cholecalciferol (VITAMIN D) 2000 units tablet Take 2,000 Units by mouth daily.   Yes [provider]  enoxaparin (LOVENOX) 40 MG/0.4ML injection Inject 0.4 mLs (40 mg total) into the skin daily. 01/07/17  Yes Cross, Melissa D, NP  levothyroxine (SYNTHROID, LEVOTHROID) 25 MCG tablet Take 25 mcg by mouth daily before breakfast.   Yes [provider]  lidocaine-prilocaine (EMLA) cream Apply a quarter size amount to affected area 1 hour prior to coming to chemotherapy. 10/25/16  Yes Holley Bouche, NP  megestrol (MEGACE) 40 MG/ML suspension Take 2 mLs (80 mg total) by mouth 2 (two) times daily. 12/15/16  Yes Holley Bouche, NP  metFORMIN (GLUCOPHAGE-XR) 500 MG 24 hr tablet Take 500 mg by mouth every evening.    Yes [provider]  naproxen sodium (ANAPROX) 220 MG tablet Take 220 mg by mouth daily as needed (pain).    Yes [provider]  omeprazole (PRILOSEC) 20 MG capsule Take 1 capsule (20 mg total) by mouth daily. 10/11/16  Yes Baird Cancer, PA-C  ondansetron (ZOFRAN ODT) 4 MG disintegrating tablet 4mg  ODT q4 hours prn nausea/vomit 11/06/16  Yes Milton Ferguson, MD  polyethylene glycol powder (GLYCOLAX/MIRALAX) powder Take 1 capful daily. Patient taking differently: Take 17 g by mouth daily as needed for mild constipation.  10/28/16  Yes Holley Bouche, NP  Potassium Chloride ER 20 MEQ TBCR Take 20 mEq by mouth 2 (two) times daily. 12/15/16  Yes Holley Bouche, NP  pravastatin (PRAVACHOL) 80 MG tablet Take 80 mg by mouth daily.    Yes  [provider]  prochlorperazine (COMPAZINE) 10 MG tablet Take 1 tablet (10 mg total) by mouth every 6 (six) hours as needed for nausea or vomiting. 10/25/16  Yes Holley Bouche, NP  senna (SENOKOT) 8.6 MG TABS tablet Take 1 tablet by mouth daily as needed for mild constipation.   Yes [provider]  traMADol (ULTRAM) 50 MG tablet Take 1 tablet (50 mg total) by mouth every 6 (six) hours as needed (pain). 01/07/17  Yes Cross, Melissa D, NP  morphine 4 MG/ML injection Inject 1 mL (4 mg total) into the vein every 2 (two) hours as needed for moderate pain. 02/15/17   Dhungel, Flonnie Overman, MD   No Known Allergies Review of Systems  Unable to perform ROS: Acuity of condition    Physical Exam  Constitutional: She is oriented to person, place, and time. She appears ill.  Does not make  eye contact, calm and cooperative  HENT:  Head: Normocephalic and atraumatic.  Cardiovascular: Normal rate.  Pulmonary/Chest: Effort normal and breath sounds normal.  Abdominal: Bowel sounds are increased. There is tenderness.  Neurological: She is alert and oriented to person, place, and time.  Skin: Skin is warm and dry.  Nursing note and vitals reviewed.   Vital Signs: BP (!) 149/84 (BP Location: Right Arm)   Pulse 90   Temp 98.6 F (37 C) (Oral)   Resp 19   Ht 5\' 3"  (1.6 m)   Wt 51.5 kg (113 lb 8.6 oz)   SpO2 100%   BMI 20.11 kg/m  Pain Assessment: No/denies pain POSS *See Group Information*: 1-Acceptable,Awake and alert Pain Score: 0-No pain   SpO2: SpO2: 100 % O2 Device:SpO2: 100 % O2 Flow Rate: .   IO: Intake/output summary:   Intake/Output Summary (Last 24 hours) at 02/15/2017 1222 Last data filed at 02/15/2017 0093 Gross per 24 hour  Intake 1425 ml  Output -  Net 1425 ml    LBM: Last BM Date: 02/12/17 Baseline Weight: Weight: 51.3 kg (113 lb) Most recent weight: Weight: 51.5 kg (113 lb 8.6 oz)     Palliative Assessment/Data:   Flowsheet Rows     Most Recent  Value  Intake Tab  Referral Department  Hospitalist  Unit at Time of Referral  Med/Surg Unit  Palliative Care Primary Diagnosis  Cancer  Date Notified  02/13/17  Palliative Care Type  New Palliative care  Reason for referral  Counsel Regarding Hospice, Clarify Goals of Care, Psychosocial or Spiritual support  Date of Admission  02/13/17  Date first seen by Palliative Care  02/15/17  # of days Palliative referral response time  2 Day(s)  # of days IP prior to Palliative referral  0  Clinical Assessment  Palliative Performance Scale Score  30%  Pain Max last 24 hours  Not able to report  Pain Min Last 24 hours  Not able to report  Dyspnea Max Last 24 Hours  Not able to report  Dyspnea Min Last 24 hours  Not able to report  Psychosocial & Spiritual Assessment  Palliative Care Outcomes  Patient/Family meeting held?  Yes  Who was at the meeting?  Patient at bedside, daughter Laurel Dimmer and son-in-law [place] goals of care, Completed durable DNR, Counseled regarding hospice, Provided psychosocial or spiritual support  Patient/Family wishes: Interventions discontinued/not started   Mechanical Ventilation      Time In: 1020 Time Out: 1110 Time Total: 50 minutes Greater than 50%  of this time was spent counseling and coordinating care related to the above assessment and plan.  Signed by: Drue Novel, NP   Please contact Palliative Medicine Team phone at [phone] for questions and concerns.  For individual provider: See Shea Evans

## 2017-02-23 ENCOUNTER — Other Ambulatory Visit (HOSPITAL_COMMUNITY): Payer: Medicare Other

## 2017-02-23 ENCOUNTER — Ambulatory Visit (HOSPITAL_COMMUNITY): Payer: Medicare Other

## 2017-02-24 ENCOUNTER — Other Ambulatory Visit (HOSPITAL_COMMUNITY): Payer: Medicare Other

## 2017-02-24 ENCOUNTER — Ambulatory Visit (HOSPITAL_COMMUNITY): Payer: Medicare Other

## 2017-03-15 DEATH — deceased

## 2019-06-20 IMAGING — CT CT CHEST W/O CM
2 of 3 series · 15 of 36 positions shown, 18 images · non-contrast
Comparison: Abdomen pelvis CT from 10/07/2016

CLINICAL DATA: Peritoneal carcinomatosis.

EXAM:
CT CHEST WITHOUT CONTRAST
TECHNIQUE: Multidetector CT imaging of the chest was performed following the
standard protocol without IV contrast.

[Series 2: thorax · axial · 0.57mm/px · z∈[+1072,+1304]mm · 12 of 138 slices shown, 15 images]
[im 11/138  mediastinal]
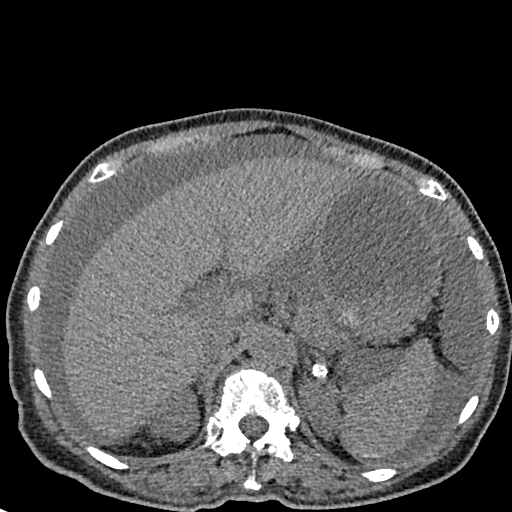
[im 11/138  lung]
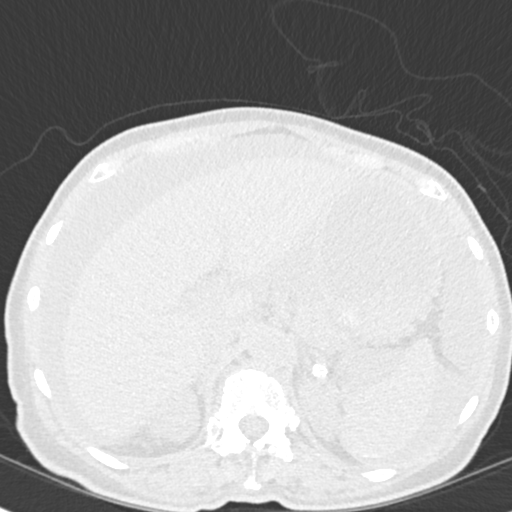
[im 21/138  lung]
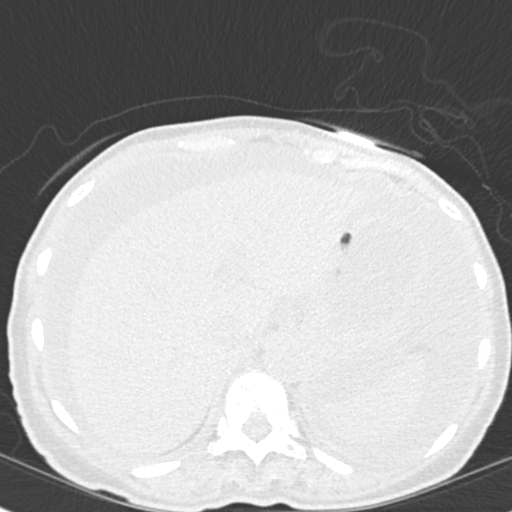
[im 31/138  lung]
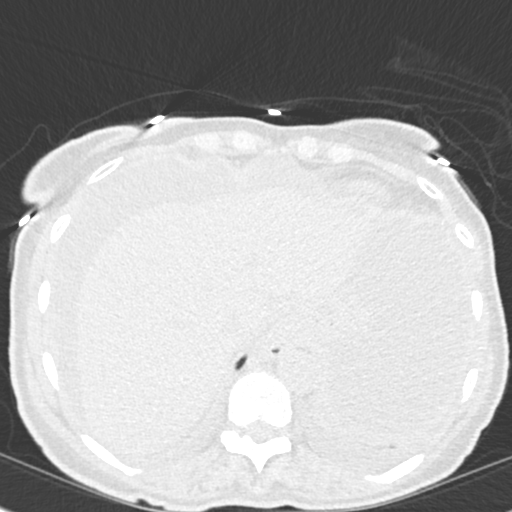
[im 41/138  lung]
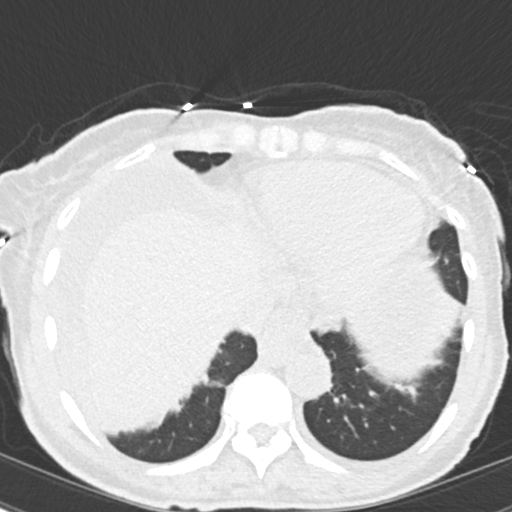
[im 51/138  mediastinal]
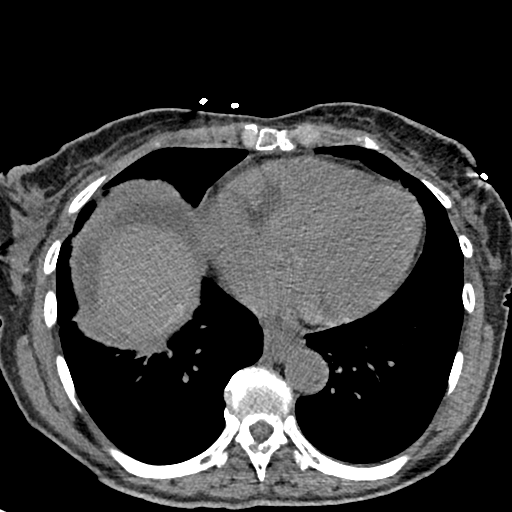
[im 51/138  lung]
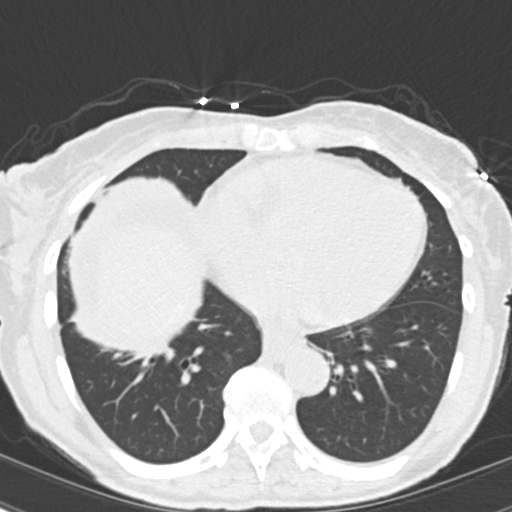
[im 61/138  lung]
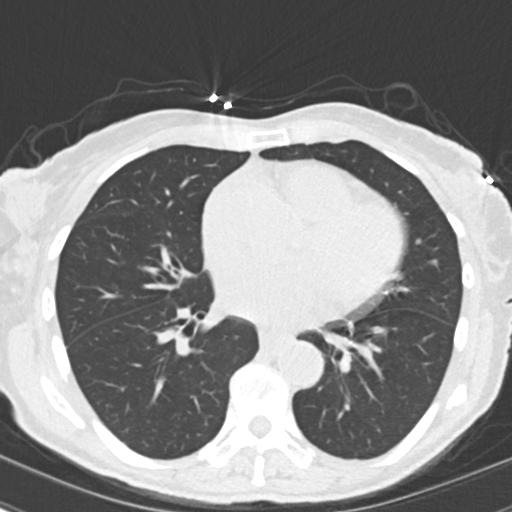
[im 77/138  lung]
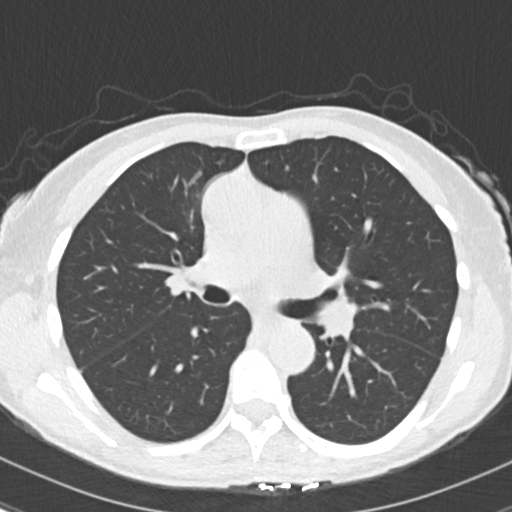
[im 87/138  lung]
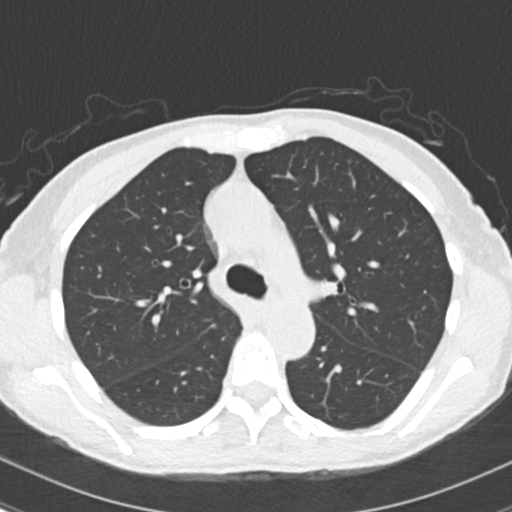
[im 97/138  mediastinal]
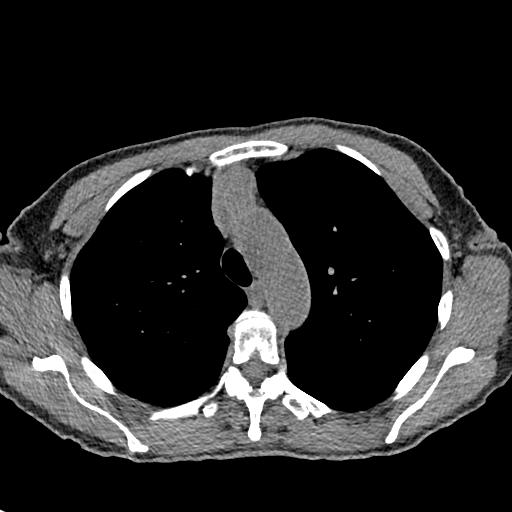
[im 97/138  lung]
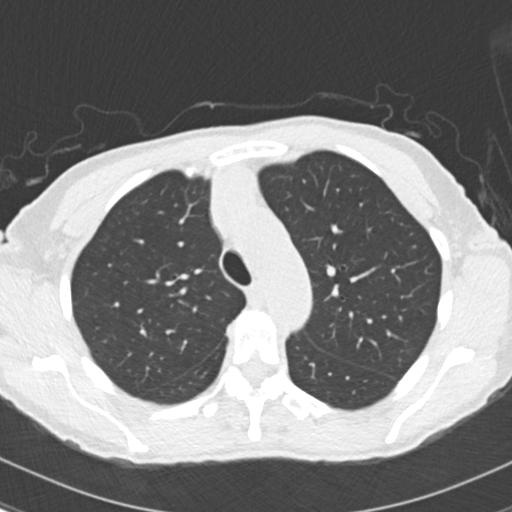
[im 107/138  lung]
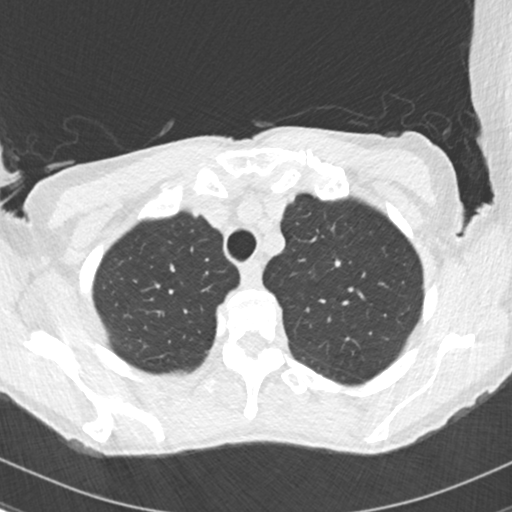
[im 117/138  lung]
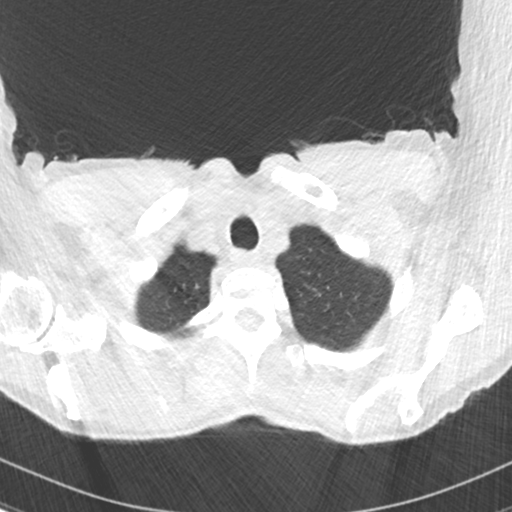
[im 127/138  lung]
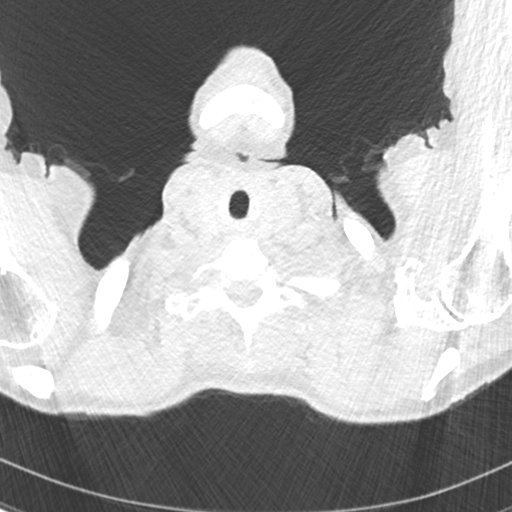

[Series 5: coronal · coronal · 0.58mm/px · 3 of 115 slices shown]
[im 23/115  lung]
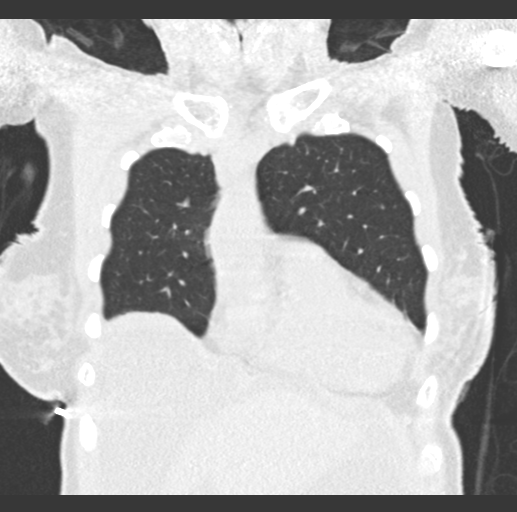
[im 46/115  lung]
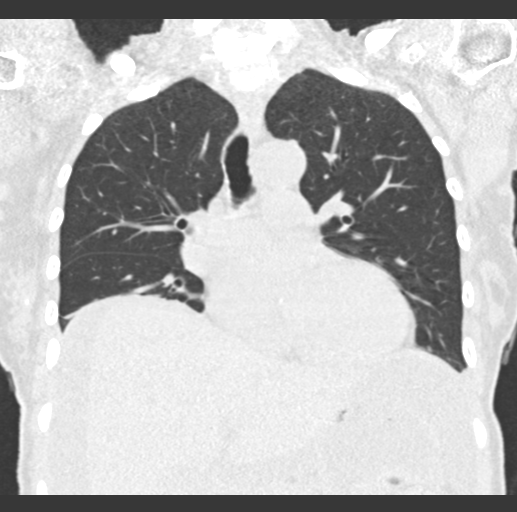
[im 69/115  lung]
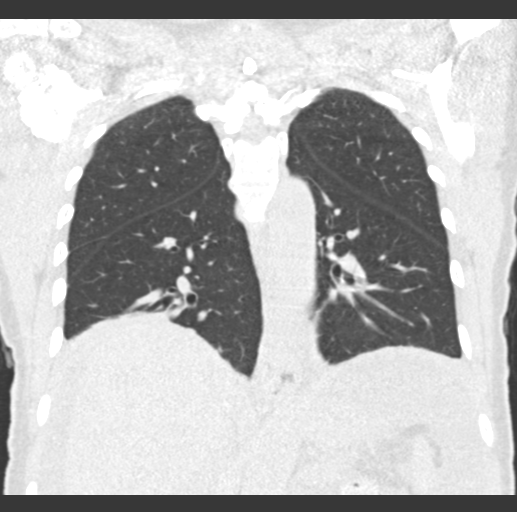

[15 of 36 positions shown; findings below may reference images not displayed]

FINDINGS: Cardiovascular: The heart is enlarged. No substantial pericardial
effusion. Coronary artery calcification is noted. Atherosclerotic
calcification is noted in the wall of the thoracic aorta.

Mediastinum/Nodes: No mediastinal lymphadenopathy. Index precarinal
lymph node is 9 mm in short axis. No evidence for gross hilar
lymphadenopathy although assessment is limited by the lack of
intravenous contrast on today's study. Tiny hiatal hernia. Esophagus
otherwise unremarkable. There is no axillary lymphadenopathy.

Lungs/Pleura: Subpleural atelectasis noted in the lung bases. No
suspicious pulmonary nodule or mass. No focal airspace
consolidation. No pulmonary edema. Tiny left pleural effusion noted.

Upper Abdomen: Intraperitoneal fluid again evident, better
documented on the recent abdomen and pelvis CT.

Musculoskeletal: Bone windows reveal no worrisome lytic or sclerotic
osseous lesions.
IMPRESSION: 1. Tiny left pleural effusion with basilar atelectasis in both
lungs. No suspicious pulmonary nodule or mass.
2.  Emphysema. ([id number])
3.  Aortic Atherosclerois ([id number]-[id number])
